# Patient Record
Sex: Female | Born: 1940 | Race: White | Hispanic: No | Marital: Single | Smoking: Current every day smoker
Health system: Southern US, Community
[De-identification: ages and names within clinical notes are randomized; demographics above are authoritative.]

## PROBLEM LIST (undated history)

## (undated) DIAGNOSIS — E785 Hyperlipidemia, unspecified: Secondary | ICD-10-CM

## (undated) DIAGNOSIS — F0390 Unspecified dementia without behavioral disturbance: Secondary | ICD-10-CM

## (undated) DIAGNOSIS — I2510 Atherosclerotic heart disease of native coronary artery without angina pectoris: Secondary | ICD-10-CM

## (undated) DIAGNOSIS — I10 Essential (primary) hypertension: Secondary | ICD-10-CM

## (undated) DIAGNOSIS — S73004A Unspecified dislocation of right hip, initial encounter: Secondary | ICD-10-CM

## (undated) HISTORY — PX: CARDIAC SURGERY: SHX584

## (undated) HISTORY — PX: CORONARY ANGIOPLASTY WITH STENT PLACEMENT: SHX49

## (undated) HISTORY — PX: TOTAL HIP ARTHROPLASTY: SHX124

---

## 2006-05-13 ENCOUNTER — Inpatient Hospital Stay: Payer: Self-pay | Admitting: Internal Medicine

## 2006-05-13 ENCOUNTER — Other Ambulatory Visit: Payer: Self-pay

## 2007-11-02 ENCOUNTER — Inpatient Hospital Stay: Payer: Self-pay | Admitting: Otolaryngology

## 2013-12-21 DIAGNOSIS — F17200 Nicotine dependence, unspecified, uncomplicated: Secondary | ICD-10-CM | POA: Diagnosis not present

## 2013-12-21 DIAGNOSIS — Z7982 Long term (current) use of aspirin: Secondary | ICD-10-CM | POA: Diagnosis not present

## 2013-12-21 DIAGNOSIS — Z9861 Coronary angioplasty status: Secondary | ICD-10-CM | POA: Diagnosis not present

## 2013-12-21 DIAGNOSIS — R0989 Other specified symptoms and signs involving the circulatory and respiratory systems: Secondary | ICD-10-CM | POA: Diagnosis not present

## 2013-12-21 DIAGNOSIS — E785 Hyperlipidemia, unspecified: Secondary | ICD-10-CM | POA: Diagnosis not present

## 2013-12-21 DIAGNOSIS — Z79899 Other long term (current) drug therapy: Secondary | ICD-10-CM | POA: Diagnosis not present

## 2013-12-21 DIAGNOSIS — I2510 Atherosclerotic heart disease of native coronary artery without angina pectoris: Secondary | ICD-10-CM | POA: Diagnosis not present

## 2013-12-21 DIAGNOSIS — I10 Essential (primary) hypertension: Secondary | ICD-10-CM | POA: Diagnosis not present

## 2014-07-29 ENCOUNTER — Emergency Department: Payer: Self-pay | Admitting: Internal Medicine

## 2014-07-29 DIAGNOSIS — G3109 Other frontotemporal dementia: Secondary | ICD-10-CM | POA: Diagnosis not present

## 2014-07-29 DIAGNOSIS — F600 Paranoid personality disorder: Secondary | ICD-10-CM | POA: Diagnosis not present

## 2014-07-29 DIAGNOSIS — N390 Urinary tract infection, site not specified: Secondary | ICD-10-CM | POA: Diagnosis not present

## 2014-07-29 DIAGNOSIS — R4182 Altered mental status, unspecified: Secondary | ICD-10-CM | POA: Diagnosis not present

## 2014-07-29 DIAGNOSIS — R45850 Homicidal ideations: Secondary | ICD-10-CM | POA: Diagnosis not present

## 2014-07-29 DIAGNOSIS — F329 Major depressive disorder, single episode, unspecified: Secondary | ICD-10-CM | POA: Diagnosis not present

## 2014-07-29 DIAGNOSIS — Z0389 Encounter for observation for other suspected diseases and conditions ruled out: Secondary | ICD-10-CM | POA: Diagnosis not present

## 2014-07-29 DIAGNOSIS — Z880 Allergy status to penicillin: Secondary | ICD-10-CM | POA: Diagnosis not present

## 2014-07-29 DIAGNOSIS — F22 Delusional disorders: Secondary | ICD-10-CM | POA: Diagnosis not present

## 2014-07-29 DIAGNOSIS — F419 Anxiety disorder, unspecified: Secondary | ICD-10-CM | POA: Diagnosis not present

## 2014-07-29 DIAGNOSIS — F29 Unspecified psychosis not due to a substance or known physiological condition: Secondary | ICD-10-CM | POA: Diagnosis not present

## 2014-07-29 LAB — COMPREHENSIVE METABOLIC PANEL
ALBUMIN: 4 g/dL (ref 3.4–5.0)
ALK PHOS: 114 U/L
Anion Gap: 8 (ref 7–16)
BILIRUBIN TOTAL: 0.7 mg/dL (ref 0.2–1.0)
BUN: 12 mg/dL (ref 7–18)
CALCIUM: 9.1 mg/dL (ref 8.5–10.1)
Chloride: 105 mmol/L (ref 98–107)
Co2: 27 mmol/L (ref 21–32)
Creatinine: 1.44 mg/dL — ABNORMAL HIGH (ref 0.60–1.30)
EGFR (African American): 46 — ABNORMAL LOW
EGFR (Non-African Amer.): 38 — ABNORMAL LOW
GLUCOSE: 109 mg/dL — AB (ref 65–99)
OSMOLALITY: 280 (ref 275–301)
Potassium: 3.9 mmol/L (ref 3.5–5.1)
SGOT(AST): 36 U/L (ref 15–37)
SGPT (ALT): 16 U/L
SODIUM: 140 mmol/L (ref 136–145)
Total Protein: 8.2 g/dL (ref 6.4–8.2)

## 2014-07-29 LAB — CBC
HCT: 45.2 % (ref 35.0–47.0)
HGB: 14.7 g/dL (ref 12.0–16.0)
MCH: 32.9 pg (ref 26.0–34.0)
MCHC: 32.4 g/dL (ref 32.0–36.0)
MCV: 102 fL — ABNORMAL HIGH (ref 80–100)
PLATELETS: 357 10*3/uL (ref 150–440)
RBC: 4.45 10*6/uL (ref 3.80–5.20)
RDW: 12.9 % (ref 11.5–14.5)
WBC: 7.6 10*3/uL (ref 3.6–11.0)

## 2014-07-29 LAB — URINALYSIS, COMPLETE
GLUCOSE, UR: NEGATIVE mg/dL (ref 0–75)
Ketone: NEGATIVE
Nitrite: POSITIVE
Ph: 5 (ref 4.5–8.0)
Specific Gravity: 1.018 (ref 1.003–1.030)
Squamous Epithelial: 11

## 2014-07-29 LAB — DRUG SCREEN, URINE

## 2014-07-29 LAB — ACETAMINOPHEN LEVEL: Acetaminophen: <2 ug/mL

## 2014-07-29 LAB — SALICYLATE LEVEL: SALICYLATES, SERUM: 3.1 mg/dL — AB

## 2014-07-29 LAB — ETHANOL

## 2014-07-31 DIAGNOSIS — N390 Urinary tract infection, site not specified: Secondary | ICD-10-CM | POA: Diagnosis not present

## 2014-07-31 DIAGNOSIS — I10 Essential (primary) hypertension: Secondary | ICD-10-CM | POA: Diagnosis not present

## 2014-07-31 DIAGNOSIS — Z7951 Long term (current) use of inhaled steroids: Secondary | ICD-10-CM | POA: Diagnosis not present

## 2014-07-31 DIAGNOSIS — F17210 Nicotine dependence, cigarettes, uncomplicated: Secondary | ICD-10-CM | POA: Diagnosis present

## 2014-07-31 DIAGNOSIS — I2510 Atherosclerotic heart disease of native coronary artery without angina pectoris: Secondary | ICD-10-CM | POA: Diagnosis present

## 2014-07-31 DIAGNOSIS — F29 Unspecified psychosis not due to a substance or known physiological condition: Secondary | ICD-10-CM | POA: Diagnosis not present

## 2014-07-31 DIAGNOSIS — Z882 Allergy status to sulfonamides status: Secondary | ICD-10-CM | POA: Diagnosis not present

## 2014-07-31 DIAGNOSIS — H02102 Unspecified ectropion of right lower eyelid: Secondary | ICD-10-CM | POA: Diagnosis not present

## 2014-07-31 DIAGNOSIS — Z880 Allergy status to penicillin: Secondary | ICD-10-CM | POA: Diagnosis not present

## 2014-07-31 DIAGNOSIS — Z7902 Long term (current) use of antithrombotics/antiplatelets: Secondary | ICD-10-CM | POA: Diagnosis not present

## 2014-07-31 DIAGNOSIS — F329 Major depressive disorder, single episode, unspecified: Secondary | ICD-10-CM | POA: Diagnosis not present

## 2014-07-31 DIAGNOSIS — N3001 Acute cystitis with hematuria: Secondary | ICD-10-CM | POA: Diagnosis present

## 2014-07-31 DIAGNOSIS — I252 Old myocardial infarction: Secondary | ICD-10-CM | POA: Diagnosis not present

## 2014-07-31 DIAGNOSIS — R4182 Altered mental status, unspecified: Secondary | ICD-10-CM | POA: Diagnosis not present

## 2014-07-31 DIAGNOSIS — F419 Anxiety disorder, unspecified: Secondary | ICD-10-CM | POA: Diagnosis not present

## 2014-07-31 DIAGNOSIS — F22 Delusional disorders: Secondary | ICD-10-CM | POA: Diagnosis not present

## 2014-07-31 DIAGNOSIS — E780 Pure hypercholesterolemia: Secondary | ICD-10-CM | POA: Diagnosis present

## 2014-07-31 DIAGNOSIS — Z7982 Long term (current) use of aspirin: Secondary | ICD-10-CM | POA: Diagnosis not present

## 2014-08-03 LAB — URINE CULTURE

## 2014-11-02 ENCOUNTER — Observation Stay
Admit: 2014-11-02 | Disposition: A | Discharge status: Home / Self Care | Payer: Self-pay | Attending: Orthopedic Surgery | Admitting: Orthopedic Surgery

## 2014-11-02 DIAGNOSIS — Z882 Allergy status to sulfonamides status: Secondary | ICD-10-CM | POA: Diagnosis not present

## 2014-11-02 DIAGNOSIS — Z9049 Acquired absence of other specified parts of digestive tract: Secondary | ICD-10-CM | POA: Diagnosis not present

## 2014-11-02 DIAGNOSIS — T84028D Dislocation of other internal joint prosthesis, subsequent encounter: Secondary | ICD-10-CM | POA: Diagnosis not present

## 2014-11-02 DIAGNOSIS — Z96649 Presence of unspecified artificial hip joint: Secondary | ICD-10-CM | POA: Diagnosis not present

## 2014-11-02 DIAGNOSIS — M25551 Pain in right hip: Secondary | ICD-10-CM | POA: Diagnosis not present

## 2014-11-02 DIAGNOSIS — R262 Difficulty in walking, not elsewhere classified: Secondary | ICD-10-CM | POA: Diagnosis not present

## 2014-11-02 DIAGNOSIS — Z7902 Long term (current) use of antithrombotics/antiplatelets: Secondary | ICD-10-CM | POA: Diagnosis not present

## 2014-11-02 DIAGNOSIS — Z87891 Personal history of nicotine dependence: Secondary | ICD-10-CM | POA: Diagnosis not present

## 2014-11-02 DIAGNOSIS — Z951 Presence of aortocoronary bypass graft: Secondary | ICD-10-CM | POA: Diagnosis not present

## 2014-11-02 DIAGNOSIS — T84020A Dislocation of internal right hip prosthesis, initial encounter: Secondary | ICD-10-CM | POA: Diagnosis not present

## 2014-11-02 DIAGNOSIS — T84028A Dislocation of other internal joint prosthesis, initial encounter: Secondary | ICD-10-CM | POA: Diagnosis not present

## 2014-11-02 DIAGNOSIS — Z7982 Long term (current) use of aspirin: Secondary | ICD-10-CM | POA: Diagnosis not present

## 2014-11-02 DIAGNOSIS — Z79899 Other long term (current) drug therapy: Secondary | ICD-10-CM | POA: Diagnosis not present

## 2014-11-02 DIAGNOSIS — I10 Essential (primary) hypertension: Secondary | ICD-10-CM | POA: Diagnosis not present

## 2014-11-02 DIAGNOSIS — S73004A Unspecified dislocation of right hip, initial encounter: Secondary | ICD-10-CM | POA: Diagnosis not present

## 2014-11-02 DIAGNOSIS — Z8673 Personal history of transient ischemic attack (TIA), and cerebral infarction without residual deficits: Secondary | ICD-10-CM | POA: Diagnosis not present

## 2014-11-02 DIAGNOSIS — Z96641 Presence of right artificial hip joint: Secondary | ICD-10-CM | POA: Diagnosis not present

## 2014-11-02 DIAGNOSIS — Z01818 Encounter for other preprocedural examination: Secondary | ICD-10-CM | POA: Diagnosis not present

## 2014-11-02 DIAGNOSIS — Z880 Allergy status to penicillin: Secondary | ICD-10-CM | POA: Diagnosis not present

## 2014-11-02 LAB — BASIC METABOLIC PANEL
Anion Gap: 11 (ref 7–16)
BUN: 40 mg/dL — AB
CO2: 24 mmol/L
CREATININE: 1.25 mg/dL — AB
Calcium, Total: 9.2 mg/dL
Chloride: 109 mmol/L
EGFR (Non-African Amer.): 42 — ABNORMAL LOW
GFR CALC AF AMER: 49 — AB
Glucose: 110 mg/dL — ABNORMAL HIGH
Potassium: 3.7 mmol/L
Sodium: 144 mmol/L

## 2014-11-02 LAB — APTT

## 2014-11-02 LAB — PROTIME-INR
INR: 1.1
Prothrombin Time: 14.4 secs

## 2014-11-02 LAB — CBC
HCT: 39.5 % (ref 35.0–47.0)
HGB: 13 g/dL (ref 12.0–16.0)
MCH: 32.5 pg (ref 26.0–34.0)
MCHC: 33 g/dL (ref 32.0–36.0)
MCV: 98 fL (ref 80–100)
Platelet: 171 10*3/uL (ref 150–440)
RBC: 4.01 10*6/uL (ref 3.80–5.20)
RDW: 13.5 % (ref 11.5–14.5)
WBC: 11.6 10*3/uL — AB (ref 3.6–11.0)

## 2014-11-02 LAB — CK: CK, TOTAL: 4746 U/L — AB

## 2014-11-05 DIAGNOSIS — S73004D Unspecified dislocation of right hip, subsequent encounter: Secondary | ICD-10-CM | POA: Diagnosis not present

## 2014-11-05 DIAGNOSIS — Z96641 Presence of right artificial hip joint: Secondary | ICD-10-CM | POA: Diagnosis not present

## 2014-11-05 DIAGNOSIS — Z87891 Personal history of nicotine dependence: Secondary | ICD-10-CM | POA: Diagnosis not present

## 2014-11-05 DIAGNOSIS — Z7982 Long term (current) use of aspirin: Secondary | ICD-10-CM | POA: Diagnosis not present

## 2014-11-05 NOTE — Consult Note (Signed)
PATIENT NAME:  Tiffany Turner, Tiffany Turner MR#:  [id number] DATE OF BIRTH:  01/06/1941  DATE OF CONSULTATION:  07/31/2014  REFERRING PHYSICIAN:   CONSULTING PHYSICIAN:  Audery Amel, MD  IDENTIFYING INFORMATION AND REASON FOR CONSULT: This is a 74 year old woman who was brought to the Emergency Room the day before yesterday by her family because of bizarre behavior.   CHIEF COMPLAINT: "You already know."   HISTORY OF PRESENT ILLNESS: Information obtained from the patient and the chart. The patient was seen by Dr. Guss Bunde over the weekend, who recommended re-evaluation but was suspicious that she was having mental health issues. On reevaluation today the patient is not a very forthcoming historian. She by and large refuses to cooperate for most of the history.  She does know that she is in an Emergency Room. She knows that she is at [facility] in [place]. Knows the year, but thinks the month is February. She cannot describe for me the reasons why she was brought up here, saying that she just needed "a checkup." When I reviewed with her the information in the chart such as concerns about her walking outside in the snow and threatening statements, she got angry and dismissed all of this and changed the topic. The patient will not tell me about her mood. Will not tell me about sleep patterns. She got very quickly paranoid at any questions. She is not forthcoming in answers about hallucinations or suicidal or homicidal ideation. She does not seem to have improved or changed since being in the hospital the last couple of days or receiving initial treatment for her urinary tract infection.   PAST PSYCHIATRIC HISTORY: According to the chart she has no previous psychiatric history. Son reports that this behavior is very unlike her. No history of psychiatric hospitalization or psychiatric medicine treatment or suicidal behavior in the past.   SUBSTANCE ABUSE HISTORY: Nothing is known about this.   FAMILY  HISTORY: The patient will not answer any questions about it.   SOCIAL HISTORY: It is not clear to me whether she lives by herself or whether her son is living with her right now and she will not answer the question.   PAST MEDICAL HISTORY: She tells me that she has a heart doctor, but does not have any other regular doctors. She refuses to answer any other medical questions at this time.   MEDICATIONS:  The patient is listed as being on medication for blood pressure and having been on clindamycin, but that may not be the most up to date list. She has a history according to the old chart of coronary artery disease and hyperlipidemia.   REVIEW OF SYSTEMS: The patient un-forthcoming about most of this. Will not answer any questions about current symptoms.   MENTAL STATUS EXAMINATION: A somewhat disheveled woman who does not look very healthy and looks older than her stated age. She made intermittent eye contact. Psychomotor activity was fairly normal. Speech was clipped at times. Became angry at times. Eventually refused to speak at all. Affect was labile with rapid escalation of irritability. Mood was stated as being none of my business. Thoughts were disorganized and paranoid. Did not report hallucinations. Would not answer questions about suicidality. She could repeat 3 words immediately, remembered 2 out of 3 at 3 minutes. Alert and oriented to being in the hospital, but not clear that she understands why.   LABORATORY RESULTS: The patient has a urinalysis very consistent with a urinary tract infection which has grown  out gram-negative rods, full sensitivities and identification pending. EKG, normal sinus rhythm. Drug screen is all negative. CBC unremarkable, normal white count, Chemistry panel, elevated creatinine just to 1.4.   VITAL SIGNS: Blood pressure 160/81, respirations 18, pulse 102, temperature 98.3.   ASSESSMENT: This is a 74 year old woman with acute mental status changes, who presents  with paranoia and disorganized thinking, recent bizarre behavior. She does have a urinary tract infection, it is not clear how much this is a contributor to her current symptoms. After 2 days in the Emergency Room she is seeming no better mentally. She has initiated treatment for the urinary tract infection. At this point I think because of her uncooperativeness and the recent reports of erratic behavior she continues to meet commitment criteria and needs further evaluation and treatment.   TREATMENT PLAN: Suggest we proceed with referral to [facility] or other geriatric units. Case discussed with Emergency Room doctor and social worker and nurse on the psychiatry team. Continue treatment for her urinary tract infection and other medications in the meanwhile.   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   SECONDARY DIAGNOSES:   AXIS III:    1.  History of high blood pressure.  2.  Coronary artery disease.     ____________________________ Audery AmelJohn T. Twala Collings, MD jtc:bu D: 07/31/2014 12:55:56 ET T: 07/31/2014 13:22:50 ET JOB#: [id number]  cc: Audery AmelJohn T. Brinley Treanor, MD, <Dictator> Audery AmelJOHN T Derricka Mertz MD ELECTRONICALLY SIGNED 08/16/2014 17:20

## 2014-11-05 NOTE — Consult Note (Signed)
PATIENT NAME:  Tiffany Turner, Tiffany Turner MR#:  [id number] DATE OF BIRTH:  25-Mar-1941  DATE OF CONSULTATION:  07/29/2014  REFERRING PHYSICIAN:   CONSULTING PHYSICIAN:  Shannara Winbush K. Yarely Bebee, MD  SUBJECTIVE: The patient was seen in consultation in the Emergency Room. The patient is a 74 year old white female who was never married, single, but put several children up for adoption and raised 1 son who is 75 years old and is in touch with him. The patient retired after having worked at [employer] for many years. According to information obtained from the staff, patient has been walking in the snow without shoes and wandering around with a suitcase and does not know where she was going. This was a big concern, and so son got involved and brought her here for help.  PAST PSYCHIATRIC HISTORY: No previous history of inpatient hold on psychiatry.  No history of suicidal ideation. Not being followed by any psychiatrist. Patient is the historian here other than the information obtained from the staff physician.  MENTAL STATUS:  Patient is adequately dressed. Grooming is fair. Alert and oriented only with help. However, she stated this was 08/29/2014, even after asking her twice. She knew she is at [facility] [place]. [place] [address] [place]. Denies feeling depressed.  Denies feeling hopeless or helpless.  Denies s/h ideas or plans. Admits that she is feeling lonely since she lost 2 of her pet dogs. No psychosis. Denies auditory or visual hallucinations. No paranoid thinking. She could spell the word "world" forward, could not spell it backward. Regarding judgment, for fire she said she would look for a fire alarm. Insight and judgment fair but impulse control seems to be poor.   IMPRESSION: Rule out early dementia with behavioral problems.  RECOMMENDATIONS: Recommend inpatient hospitalization to a geriatric facility for a closer observation  and evaluation and help as needed.   ____________________________ Jannet MantisSurya K. Guss Bundehalla, MD skc:ST D: 07/29/2014 17:31:27 ET T: 07/29/2014 21:45:58 ET JOB#: [id number]  cc: Monika SalkSurya K. Guss Bundehalla, MD, <Dictator> Beau FannySURYA K Sadrac Zeoli MD ELECTRONICALLY SIGNED 07/30/2014 12:30

## 2014-11-05 NOTE — Op Note (Signed)
PATIENT NAME:  Tiffany Turner, Tiffany Turner MR#:  [id number] DATE OF BIRTH:  1941/02/24  DATE OF PROCEDURE:  11/02/2014  PREOPERATIVE DIAGNOSIS: Dislocation right total hip.   POSTOPERATIVE DIAGNOSIS: Dislocation right total hip.  PROCEDURE: Closed reduction right total hip.   ANESTHESIA: General.   SURGEON: Kennedy BuckerMichael Heidi Lemay, MD  DESCRIPTION OF PROCEDURE: The patient was brought to the Operating Room and after adequate anesthesia was obtained, the appropriate patient identification and timeout procedure was completed. With fluoroscopy, longitudinal traction was applied and with gentle initially internal rotation and then external rotation as the leg was brought out into extension, the hip reduced. It appeared concentric and a permanent C-arm view was obtained, showing reduction. A short leg knee immobilizer was then applied and the patient was sent to the recovery room in stable condition. There was no blood loss. No complications. No specimen. ____________________________ Leitha SchullerMichael J. Kristoff Coonradt, MD mjm:AT D: 11/02/2014 20:37:26 ET T: 11/03/2014 09:19:27 ET JOB#: [id number]  cc: Leitha SchullerMichael J. Kenadie Royce, MD, <Dictator> Leitha SchullerMICHAEL J Cesareo Vickrey MD ELECTRONICALLY SIGNED 11/03/2014 9:47

## 2014-11-05 NOTE — H&P (Signed)
PATIENT NAME:  Tiffany Turner, Tiffany Turner MR#:  [id number] DATE OF BIRTH:  [birth year]  DATE OF ADMISSION:  11/02/2014  CHIEF COMPLAINT: Right hip pain.   HISTORY OF PRESENT ILLNESS: The patient is a confused, 74 year old who comes into the emergency room with a history of kicking at a drawer yesterday, by her report, suffering a popping to her hip. She stayed home until today to have this addressed. She has had a recent admission for confusion with unknown cause. In the past, apparently she was doing better. She has a remote history of hip replacement more than 10 years ago. She said it was Dr. Hyacinth MeekerMiller. It is unknown if this was done here or in [place].   PAST MEDICAL HISTORY: Remarkable for a prior cholecystectomy, C-spine surgery, her right total hip. History is obtained from the medical records. The patient is unable to give this. She also has had an episode of an admission for a neck infection, oral infection. 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    ALLERGIES: SULFA AND PENICILLIN.  SOCIAL HISTORY:  Prior history of smoking 1 to 2 packs per day.   CURRENT MEDICATIONS: Again obtained from records. The patient is unable to give her medication list: Toprol-XL 50 mg at night, Plavix 75 mg daily at bedtime, enalapril 20 mg at bedtime, Crestor 40 mg at bedtime, aspirin 81 mg daily.   REVIEW OF SYSTEMS: Is generally negative. The patient does not have a specific complaint and is confused when asked questions regarding review of systems.   PHYSICAL EXAMINATION: GENERAL: A white female who appears her stated age, in mild distress.  HEENT: Remarkable for full upper and lower dentures.  LUNGS: Clear.  HEART: Regular rate and rhythm.  ABDOMEN: Soft, nontender.  EXTREMITIES: Remarkable for a shortened and internally rotated right lower extremity. She is able to flex and extend the toes. The skin is intact and pulses are intact.   DIAGNOSTIC DATA: X-rays show a  posteriorly dislocated right total hip with long stem and Tri-spike cup.   IMPRESSION: Dislocated total hip.   RECOMMENDATIONS: For closed reduction. I will talk to her son prior to the procedure. If open reduction is needed, we will need to get records if possible and have polyethylene cup revision, if that is needed.    ____________________________ Leitha SchullerMichael J. Kamorah Nevils, MD mjm:TT D: 11/02/2014 16:28:30 ET T: 11/02/2014 16:55:39 ET JOB#: [id number]  cc: Leitha SchullerMichael J. Errol Ala, MD, <Dictator> Leitha SchullerMICHAEL J Cristal Howatt MD ELECTRONICALLY SIGNED 11/03/2014 6:50

## 2014-11-07 DIAGNOSIS — Z96641 Presence of right artificial hip joint: Secondary | ICD-10-CM | POA: Diagnosis not present

## 2014-11-07 DIAGNOSIS — Z87891 Personal history of nicotine dependence: Secondary | ICD-10-CM | POA: Diagnosis not present

## 2014-11-07 DIAGNOSIS — Z7982 Long term (current) use of aspirin: Secondary | ICD-10-CM | POA: Diagnosis not present

## 2014-11-07 DIAGNOSIS — S73004D Unspecified dislocation of right hip, subsequent encounter: Secondary | ICD-10-CM | POA: Diagnosis not present

## 2014-11-09 DIAGNOSIS — Z7982 Long term (current) use of aspirin: Secondary | ICD-10-CM | POA: Diagnosis not present

## 2014-11-09 DIAGNOSIS — Z87891 Personal history of nicotine dependence: Secondary | ICD-10-CM | POA: Diagnosis not present

## 2014-11-09 DIAGNOSIS — S73004D Unspecified dislocation of right hip, subsequent encounter: Secondary | ICD-10-CM | POA: Diagnosis not present

## 2014-11-09 DIAGNOSIS — Z96641 Presence of right artificial hip joint: Secondary | ICD-10-CM | POA: Diagnosis not present

## 2014-11-13 DIAGNOSIS — Z96641 Presence of right artificial hip joint: Secondary | ICD-10-CM | POA: Diagnosis not present

## 2014-11-13 DIAGNOSIS — Z87891 Personal history of nicotine dependence: Secondary | ICD-10-CM | POA: Diagnosis not present

## 2014-11-13 DIAGNOSIS — S73004D Unspecified dislocation of right hip, subsequent encounter: Secondary | ICD-10-CM | POA: Diagnosis not present

## 2014-11-13 DIAGNOSIS — Z7982 Long term (current) use of aspirin: Secondary | ICD-10-CM | POA: Diagnosis not present

## 2014-11-16 DIAGNOSIS — Z96641 Presence of right artificial hip joint: Secondary | ICD-10-CM | POA: Diagnosis not present

## 2014-11-16 DIAGNOSIS — Z7982 Long term (current) use of aspirin: Secondary | ICD-10-CM | POA: Diagnosis not present

## 2014-11-16 DIAGNOSIS — Z87891 Personal history of nicotine dependence: Secondary | ICD-10-CM | POA: Diagnosis not present

## 2014-11-16 DIAGNOSIS — S73004D Unspecified dislocation of right hip, subsequent encounter: Secondary | ICD-10-CM | POA: Diagnosis not present

## 2015-02-16 ENCOUNTER — Encounter: Payer: Self-pay | Admitting: Occupational Medicine

## 2015-02-16 ENCOUNTER — Emergency Department
Admission: EM | Admit: 2015-02-16 | Discharge: 2015-02-16 | Disposition: A | Discharge status: Home / Self Care | Payer: Medicare Other | Attending: Emergency Medicine | Admitting: Emergency Medicine

## 2015-02-16 DIAGNOSIS — R451 Restlessness and agitation: Secondary | ICD-10-CM | POA: Diagnosis not present

## 2015-02-16 DIAGNOSIS — Z720 Tobacco use: Secondary | ICD-10-CM | POA: Diagnosis not present

## 2015-02-16 DIAGNOSIS — G308 Other Alzheimer's disease: Secondary | ICD-10-CM

## 2015-02-16 DIAGNOSIS — Z7902 Long term (current) use of antithrombotics/antiplatelets: Secondary | ICD-10-CM | POA: Insufficient documentation

## 2015-02-16 DIAGNOSIS — I10 Essential (primary) hypertension: Secondary | ICD-10-CM | POA: Diagnosis not present

## 2015-02-16 DIAGNOSIS — Z79899 Other long term (current) drug therapy: Secondary | ICD-10-CM | POA: Diagnosis not present

## 2015-02-16 DIAGNOSIS — Z880 Allergy status to penicillin: Secondary | ICD-10-CM | POA: Diagnosis not present

## 2015-02-16 DIAGNOSIS — N390 Urinary tract infection, site not specified: Secondary | ICD-10-CM | POA: Diagnosis not present

## 2015-02-16 DIAGNOSIS — Z7982 Long term (current) use of aspirin: Secondary | ICD-10-CM | POA: Insufficient documentation

## 2015-02-16 DIAGNOSIS — Z046 Encounter for general psychiatric examination, requested by authority: Secondary | ICD-10-CM | POA: Diagnosis present

## 2015-02-16 DIAGNOSIS — F02818 Dementia in other diseases classified elsewhere, unspecified severity, with other behavioral disturbance: Secondary | ICD-10-CM

## 2015-02-16 DIAGNOSIS — G309 Alzheimer's disease, unspecified: Secondary | ICD-10-CM

## 2015-02-16 DIAGNOSIS — F0281 Dementia in other diseases classified elsewhere with behavioral disturbance: Secondary | ICD-10-CM

## 2015-02-16 HISTORY — DX: Unspecified dislocation of right hip, initial encounter: S73.004A

## 2015-02-16 HISTORY — DX: Essential (primary) hypertension: I10

## 2015-02-16 HISTORY — DX: Atherosclerotic heart disease of native coronary artery without angina pectoris: I25.10

## 2015-02-16 HISTORY — DX: Hyperlipidemia, unspecified: E78.5

## 2015-02-16 LAB — URINALYSIS COMPLETE WITH MICROSCOPIC (ARMC ONLY)
BILIRUBIN URINE: NEGATIVE
Glucose, UA: 150 mg/dL — AB
Ketones, ur: NEGATIVE mg/dL
Leukocytes, UA: NEGATIVE
Nitrite: NEGATIVE
PROTEIN: NEGATIVE mg/dL
SPECIFIC GRAVITY, URINE: 1.014 (ref 1.005–1.030)
pH: 5 (ref 5.0–8.0)

## 2015-02-16 LAB — COMPREHENSIVE METABOLIC PANEL WITH GFR
ALT: 11 U/L — ABNORMAL LOW (ref 14–54)
AST: 26 U/L (ref 15–41)
Albumin: 4.3 g/dL (ref 3.5–5.0)
Alkaline Phosphatase: 87 U/L (ref 38–126)
Anion gap: 10 (ref 5–15)
BUN: 22 mg/dL — ABNORMAL HIGH (ref 6–20)
CO2: 23 mmol/L (ref 22–32)
Calcium: 9.3 mg/dL (ref 8.9–10.3)
Chloride: 107 mmol/L (ref 101–111)
Creatinine, Ser: 1.4 mg/dL — ABNORMAL HIGH (ref 0.44–1.00)
GFR calc Af Amer: 41 mL/min — ABNORMAL LOW (ref >60)
GFR calc non Af Amer: 36 mL/min — ABNORMAL LOW (ref >60)
Glucose, Bld: 119 mg/dL — ABNORMAL HIGH (ref 65–99)
Potassium: 3.5 mmol/L (ref 3.5–5.1)
Sodium: 140 mmol/L (ref 135–145)
Total Bilirubin: 0.7 mg/dL (ref 0.3–1.2)
Total Protein: 7.9 g/dL (ref 6.5–8.1)

## 2015-02-16 LAB — CBC WITH DIFFERENTIAL/PLATELET
BASOS ABS: 0.3 10*3/uL — AB (ref 0–0.1)
BASOS PCT: 4 %
EOS PCT: 3 %
Eosinophils Absolute: 0.2 10*3/uL (ref 0–0.7)
HEMATOCRIT: 40.5 % (ref 35.0–47.0)
Hemoglobin: 13.7 g/dL (ref 12.0–16.0)
Lymphocytes Relative: 16 %
Lymphs Abs: 1.2 10*3/uL (ref 1.0–3.6)
MCH: 33.8 pg (ref 26.0–34.0)
MCHC: 33.8 g/dL (ref 32.0–36.0)
MCV: 100 fL (ref 80.0–100.0)
MONO ABS: 0.4 10*3/uL (ref 0.2–0.9)
Monocytes Relative: 5 %
NEUTROS ABS: 5.5 10*3/uL (ref 1.4–6.5)
Neutrophils Relative %: 72 %
Platelets: 354 10*3/uL (ref 150–440)
RBC: 4.05 MIL/uL (ref 3.80–5.20)
RDW: 13.8 % (ref 11.5–14.5)
WBC: 7.6 10*3/uL (ref 3.6–11.0)

## 2015-02-16 LAB — ETHANOL: Alcohol, Ethyl (B): <5 mg/dL (ref <5)

## 2015-02-16 MED ORDER — CIPROFLOXACIN HCL 250 MG PO TABS: 250.0000 mg | ORAL_TABLET | Freq: Two times a day (BID) | ORAL | Status: AC

## 2015-02-16 MED ORDER — RISPERIDONE 0.5 MG PO TABS: 0.5000 mg | ORAL_TABLET | Freq: Every day | ORAL | Status: DC

## 2015-02-16 NOTE — ED Notes (Signed)
BEHAVIORAL HEALTH ROUNDING Patient sleeping: No. Patient alert and oriented: no Behavior appropriate: Yes.  ; If no, describe:  Nutrition and fluids offered: Yes  Toileting and hygiene offered: Yes  Sitter present: no Law enforcement present: Yes  
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
## 2015-02-16 NOTE — ED Notes (Signed)

## 2015-02-16 NOTE — ED Notes (Signed)
BEHAVIORAL HEALTH ROUNDING Patient sleeping: Yes.   Patient alert and oriented: no Behavior appropriate: Yes.  ; If no, describe:  Nutrition and fluids offered: Yes  Toileting and hygiene offered: Yes  Sitter present: no Law enforcement present: Yes  

## 2015-02-16 NOTE — Discharge Instructions (Signed)
Please follow-up with [facility] on Monday. It is also very important to follow-up with a primary care doctor of your choosing to make sure that she urinary tract has improved. It is also possible that you could be in the very early stages of dementia but this needs to be further evaluated by a primary care doctor. Return to the ER for new or worsening symptoms, confusion, fever, vomiting, abdominal pain, or any other concerns.   Urinary Tract Infection A urinary tract infection (UTI) can occur any place along the urinary tract. The tract includes the kidneys, ureters, bladder, and urethra. A type of germ called bacteria often causes a UTI. UTIs are often helped with antibiotic medicine.  HOME CARE   If given, take antibiotics as told by your doctor. Finish them even if you start to feel better.  Drink enough fluids to keep your pee (urine) clear or pale yellow.  Avoid tea, drinks with caffeine, and bubbly (carbonated) drinks.  Pee often. Avoid holding your pee in for a long time.  Pee before and after having sex (intercourse).  Wipe from front to back after you poop (bowel movement) if you are a woman. Use each tissue only once. GET HELP RIGHT AWAY IF:   You have back pain.  You have lower belly (abdominal) pain.  You have chills.  You feel sick to your stomach (nauseous).  You throw up (vomit).  Your burning or discomfort with peeing does not go away.  You have a fever.  Your symptoms are not better in 3 days. MAKE SURE YOU:   Understand these instructions.  Will watch your condition.  Will get help right away if you are not doing well or get worse. Document Released: 12/10/2007 Document Revised: 03/17/2012 Document Reviewed: 01/22/2012 [facility] Patient Information 2015 [place], [place]. This information is not intended to replace advice given to you by your health care provider. Make sure you discuss any questions you have with your health care provider.

## 2015-02-16 NOTE — ED Notes (Signed)
Psych md with pt 

## 2015-02-16 NOTE — ED Notes (Signed)
Patient assigned to appropriate care area. Patient oriented to unit/care area: Informed that, for their safety, care areas are designed for safety and monitored by security cameras at all times; and visiting hours explained to patient. Patient verbalizes understanding, and verbal contract for safety obtained. 
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
## 2015-02-16 NOTE — ED Notes (Signed)
BEHAVIORAL HEALTH ROUNDING Patient sleeping: No. Patient alert and oriented: yes know she is in the [facility] doesn't know why son sent her here Behavior appropriate: Yes.  ; If no, describe: pt is pleasant and cooperative Nutrition and fluids offered: Yes  Toileting and hygiene offered: Yes  Sitter present: yes Law enforcement present: Yes
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
## 2015-02-16 NOTE — [facility] Counselor (Signed)
Writer spoke to Pt. Son about discharging and transportation.  Pt. Son would not give any specific information for the contact person who would be picking up pt. He said he would call back when he had the information.

## 2015-02-16 NOTE — [facility] Counselor (Signed)
Per request of Psych MD (Dr. Toni Amend), writer provided the pt. with information and instructions on how to access Out Pt. Mental Health Treatment ([facility] and [facility])

## 2015-02-16 NOTE — ED Notes (Signed)
ED BHU PLACEMENT JUSTIFICATION Is the patient under IVC or is there intent for IVC: Yes.   Is the patient medically cleared: No. Is there vacancy in the ED BHU: Yes.   Is the population mix appropriate for patient: Yes.   Is the patient awaiting placement in inpatient or outpatient setting:unknown Has the patient had a psychiatric consult: Yes.   Survey of unit performed for contraband, proper placement and condition of furniture, tampering with fixtures in bathroom, shower, and each patient room: Yes.  ; Findings:  APPEARANCE/BEHAVIOR Calm cooperative NEURO ASSESSMENT  Orientation: time, place and person doent know why son sent her here Hallucinations: No.None noted (Hallucinations) Speech: Normal Gait: unsteady RESPIRATORY ASSESSMENT Normal expansion.  Clear to auscultation.  No rales, rhonchi, or wheezing. CARDIOVASCULAR ASSESSMENT regular rate and rhythm, S1, S2 normal, no murmur, click, rub or gallop GASTROINTESTINAL ASSESSMENT soft, nontender, BS WNL, no r/g EXTREMITIES normal strength, tone, and muscle mass PLAN OF CARE Provide calm/safe environment. Vital signs assessed twice daily. ED BHU Assessment once each 12-hour shift. Collaborate with intake RN daily or as condition indicates. Assure the ED provider has rounded once each shift. Provide and encourage hygiene. Provide redirection as needed. Assess for escalating behavior; address immediately and inform ED provider.  Assess family dynamic and appropriateness for visitation as needed: Yes.  ; If necessary, describe findings: son left phone number [phone] Educate the patient/family about BHU procedures/visitation: Yes.  ; If necessary, describe findings:

## 2015-02-16 NOTE — ED Provider Notes (Signed)
-----------------------------------------   4:31 PM on 02/16/2015 -----------------------------------------  Dr. Toni Amend saw and evaluated Tiffany Turner. He notes she was in here in January with more significant symptoms of paranoia and agitation for which she was transferred to [facility]. There she was started on Risperdal which she took for about 1 month. He suspects she has very mild dementia. She lives alone [place] with her son living nearby. He does not feel she meets inpatient criteria and feels that her symptoms may be improved with a small nighttime dose of Risperdal which she has prescribed. She is to follow-up with [facility].  Maurilio Lovely, MD 02/16/15 [id number]

## 2015-02-16 NOTE — ED Notes (Signed)
Pt is now atleast lending against the wall while sitting on the bed.
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
## 2015-02-16 NOTE — ED Notes (Signed)
BEHAVIORAL HEALTH ROUNDING Patient sleeping: No. Patient alert and oriented: yes except doesn't know why son sent her here Behavior appropriate: Yes.  ; If no, describe: pt sitting on the edge of bed will not lie down or put on sock pt has head lending over pt is a high fall risk monitor closely Nutrition and fluids offered: Yes  Toileting and hygiene offered: Yes  Sitter present: yes Law enforcement present: Yes

## 2015-02-16 NOTE — BH Assessment (Signed)
Pt. Son called back about discharging and transportation. He stated that Tiffany Turner ([phone]) and Tiffany Turner will be here to pick her pt. When she discharges.  Pt. Son had questions about his mother discharging and medications. Writer spoke to pt. Nurse,  Nicholos Johns, RN about calling Pt. Son back about his concerns.

## 2015-10-02 ENCOUNTER — Encounter: Payer: Self-pay | Admitting: Emergency Medicine

## 2015-10-02 ENCOUNTER — Emergency Department
Admission: EM | Admit: 2015-10-02 | Discharge: 2015-10-06 | Disposition: A | Discharge status: Other Acute Inpatient Hospital | Payer: Medicare Other | Attending: Emergency Medicine | Admitting: Emergency Medicine

## 2015-10-02 DIAGNOSIS — Z7982 Long term (current) use of aspirin: Secondary | ICD-10-CM | POA: Insufficient documentation

## 2015-10-02 DIAGNOSIS — N390 Urinary tract infection, site not specified: Secondary | ICD-10-CM | POA: Diagnosis not present

## 2015-10-02 DIAGNOSIS — Z880 Allergy status to penicillin: Secondary | ICD-10-CM | POA: Diagnosis not present

## 2015-10-02 DIAGNOSIS — F0390 Unspecified dementia without behavioral disturbance: Secondary | ICD-10-CM | POA: Diagnosis not present

## 2015-10-02 DIAGNOSIS — I10 Essential (primary) hypertension: Secondary | ICD-10-CM | POA: Insufficient documentation

## 2015-10-02 DIAGNOSIS — R410 Disorientation, unspecified: Secondary | ICD-10-CM | POA: Insufficient documentation

## 2015-10-02 DIAGNOSIS — F17210 Nicotine dependence, cigarettes, uncomplicated: Secondary | ICD-10-CM | POA: Insufficient documentation

## 2015-10-02 DIAGNOSIS — Z7902 Long term (current) use of antithrombotics/antiplatelets: Secondary | ICD-10-CM | POA: Insufficient documentation

## 2015-10-02 DIAGNOSIS — F0281 Dementia in other diseases classified elsewhere with behavioral disturbance: Secondary | ICD-10-CM | POA: Diagnosis not present

## 2015-10-02 DIAGNOSIS — Z79899 Other long term (current) drug therapy: Secondary | ICD-10-CM | POA: Insufficient documentation

## 2015-10-02 DIAGNOSIS — F0280 Dementia in other diseases classified elsewhere without behavioral disturbance: Secondary | ICD-10-CM | POA: Insufficient documentation

## 2015-10-02 DIAGNOSIS — G309 Alzheimer's disease, unspecified: Secondary | ICD-10-CM | POA: Diagnosis not present

## 2015-10-02 DIAGNOSIS — G300 Alzheimer's disease with early onset: Secondary | ICD-10-CM | POA: Diagnosis not present

## 2015-10-02 DIAGNOSIS — Z046 Encounter for general psychiatric examination, requested by authority: Secondary | ICD-10-CM | POA: Diagnosis present

## 2015-10-02 LAB — CBC WITH DIFFERENTIAL/PLATELET
Basophils Absolute: 0.1 10*3/uL (ref 0–0.1)
Basophils Relative: 1 %
EOS ABS: 0.1 10*3/uL (ref 0–0.7)
Eosinophils Relative: 1 %
HCT: 38.2 % (ref 35.0–47.0)
HEMOGLOBIN: 12.9 g/dL (ref 12.0–16.0)
Lymphocytes Relative: 31 %
Lymphs Abs: 3 10*3/uL (ref 1.0–3.6)
MCH: 33.1 pg (ref 26.0–34.0)
MCHC: 33.8 g/dL (ref 32.0–36.0)
MCV: 97.9 fL (ref 80.0–100.0)
Monocytes Absolute: 0.7 10*3/uL (ref 0.2–0.9)
Neutro Abs: 5.9 10*3/uL (ref 1.4–6.5)
Platelets: 315 10*3/uL (ref 150–440)
RBC: 3.9 MIL/uL (ref 3.80–5.20)
RDW: 13.2 % (ref 11.5–14.5)
WBC: 9.8 10*3/uL (ref 3.6–11.0)

## 2015-10-02 LAB — COMPREHENSIVE METABOLIC PANEL
ALK PHOS: 72 U/L (ref 38–126)
ALT: 10 U/L — AB (ref 14–54)
AST: 21 U/L (ref 15–41)
Albumin: 3.8 g/dL (ref 3.5–5.0)
Anion gap: 8 (ref 5–15)
BUN: 13 mg/dL (ref 6–20)
CALCIUM: 9.2 mg/dL (ref 8.9–10.3)
CO2: 24 mmol/L (ref 22–32)
Chloride: 107 mmol/L (ref 101–111)
Creatinine, Ser: 1.09 mg/dL — ABNORMAL HIGH (ref 0.44–1.00)
GFR calc Af Amer: 56 mL/min — ABNORMAL LOW (ref >60)
GFR calc non Af Amer: 48 mL/min — ABNORMAL LOW (ref >60)
GLUCOSE: 121 mg/dL — AB (ref 65–99)
Potassium: 2.9 mmol/L — CL (ref 3.5–5.1)
SODIUM: 139 mmol/L (ref 135–145)
Total Bilirubin: 0.6 mg/dL (ref 0.3–1.2)
Total Protein: 7.3 g/dL (ref 6.5–8.1)

## 2015-10-02 LAB — URINALYSIS COMPLETE WITH MICROSCOPIC (ARMC ONLY)
BILIRUBIN URINE: NEGATIVE
GLUCOSE, UA: NEGATIVE mg/dL
Ketones, ur: NEGATIVE mg/dL
Leukocytes, UA: NEGATIVE
NITRITE: POSITIVE — AB
Protein, ur: NEGATIVE mg/dL
Specific Gravity, Urine: 1.019 (ref 1.005–1.030)
pH: 5 (ref 5.0–8.0)

## 2015-10-02 LAB — URINE DRUG SCREEN, QUALITATIVE (ARMC ONLY)
Amphetamines, Ur Screen: NOT DETECTED
BARBITURATES, UR SCREEN: NOT DETECTED
Benzodiazepine, Ur Scrn: NOT DETECTED
CANNABINOID 50 NG, UR ~~LOC~~: NOT DETECTED
COCAINE METABOLITE, UR ~~LOC~~: NOT DETECTED
MDMA (Ecstasy)Ur Screen: NOT DETECTED
Methadone Scn, Ur: NOT DETECTED
Opiate, Ur Screen: NOT DETECTED
PHENCYCLIDINE (PCP) UR S: NOT DETECTED
TRICYCLIC, UR SCREEN: NOT DETECTED

## 2015-10-02 LAB — ETHANOL: Alcohol, Ethyl (B): <5 mg/dL (ref <5)

## 2015-10-02 MED ORDER — CIPROFLOXACIN HCL 500 MG PO TABS
500.0000 mg | ORAL_TABLET | Freq: Once | ORAL | Status: AC
  Administered 2015-10-03: 500 mg via ORAL
  Filled 2015-10-02: qty 1

## 2015-10-02 NOTE — ED Provider Notes (Signed)
[facility] Emergency Department Provider Note    ____________________________________________  Time seen: ~2230  I have reviewed the triage vital signs and the nursing notes.   HISTORY  Chief Complaint Psychiatric Evaluation; Paranoid; and Dementia   History limited by: Not Limited   HPI Tiffany Turner is a 75 y.o. female who presents to the emergency department under IVC paperwork because of concerns for increased confusion and self care. On my exam patient denies any concerns. She denies any confusion. She denies any thoughts of wanting to herself or others. She denies any recent fevers, nausea vomiting or diarrhea.    Past Medical History  Diagnosis Date  . Hypertension   . Hyperlipemia   . Coronary artery disease     stents placed  . Hip dislocation, right (HCC)     hx of dislocation    Patient Active Problem List   Diagnosis Date Noted  . Dementia, Alzheimer's, with behavior disturbance 02/16/2015  . Hypertension 02/16/2015    Past Surgical History  Procedure Laterality Date  . Cardiac surgery      stents    Current Outpatient Rx  Name  Route  Sig  Dispense  Refill  . aspirin 81 MG tablet   Oral   Take 81 mg by mouth at bedtime.         . bisacodyl (DULCOLAX) 10 MG suppository   Rectal   Place 10 mg rectally daily as needed for moderate constipation.         . clopidogrel (PLAVIX) 75 MG tablet   Oral   Take 75 mg by mouth at bedtime.         . docusate calcium (SURFAK) 240 MG capsule   Oral   Take 240 mg by mouth daily.         . enalapril (VASOTEC) 20 MG tablet   Oral   Take 20 mg by mouth at bedtime.         . metoprolol succinate (TOPROL-XL) 50 MG 24 hr tablet   Oral   Take 50 mg by mouth at bedtime. Take with or immediately following a meal.         . risperiDONE (RISPERDAL) 0.5 MG tablet   Oral   Take 1 tablet (0.5 mg total) by mouth at bedtime.   30 tablet   0   . rosuvastatin (CRESTOR)  40 MG tablet   Oral   Take 40 mg by mouth at bedtime.           Allergies Penicillins and Sulfa antibiotics  No family history on file.  Social History Social History  Substance Use Topics  . Smoking status: Current Every Day Smoker -- 0.50 packs/day    Types: Cigarettes  . Smokeless tobacco: Not on file  . Alcohol Use: No    Review of Systems  Constitutional: Negative for fever. Cardiovascular: Negative for chest pain. Respiratory: Negative for shortness of breath. Gastrointestinal: Negative for abdominal pain, vomiting and diarrhea. Neurological: Negative for headaches, focal weakness or numbness.  10-point ROS otherwise negative.  ____________________________________________   PHYSICAL EXAM:  VITAL SIGNS: ED Triage Vitals  Enc Vitals Group     BP 10/02/15 2126 186/71 mmHg     Pulse Rate 10/02/15 2126 111     Resp 10/02/15 2126 20     Temp 10/02/15 2126 98.4 F (36.9 C)     Temp Source 10/02/15 2126 Oral     SpO2 10/02/15 2126 97 %  Weight --      Height 10/02/15 2126  (1.575 m)   Constitutional: Alert and oriented. Well appearing and in no distress. Eyes: Conjunctivae are normal. PERRL. Normal extraocular movements. ENT   Head: Normocephalic and atraumatic.   Nose: No congestion/rhinnorhea.   Mouth/Throat: Mucous membranes are moist.   Neck: No stridor. Hematological/Lymphatic/Immunilogical: No cervical lymphadenopathy. Cardiovascular: Normal rate, regular rhythm.  No murmurs, rubs, or gallops. Respiratory: Normal respiratory effort without tachypnea nor retractions. Breath sounds are clear and equal bilaterally. No wheezes/rales/rhonchi. Gastrointestinal: Patient refused. Genitourinary: Deferred Musculoskeletal: Normal range of motion in all extremities. No joint effusions.  No lower extremity tenderness nor edema. Neurologic:  Normal speech and language. No gross focal neurologic deficits are appreciated.  Skin:  Skin is warm,  dry and intact. No rash noted. Psychiatric: Mood and affect are normal. Speech and behavior are normal. Patient exhibits appropriate insight and judgment.  ____________________________________________    LABS (pertinent positives/negatives)  Labs Reviewed  COMPREHENSIVE METABOLIC PANEL - Abnormal; Notable for the following:    Potassium 2.9 (*)    Glucose, Bld 121 (*)    Creatinine, Ser 1.09 (*)    ALT 10 (*)    GFR calc non Af Amer 48 (*)    GFR calc Af Amer 56 (*)    All other components within normal limits  URINALYSIS COMPLETEWITH MICROSCOPIC ([facility] ONLY) - Abnormal; Notable for the following:    Color, Urine YELLOW (*)    APPearance CLOUDY (*)    Hgb urine dipstick 3+ (*)    Nitrite POSITIVE (*)    Bacteria, UA MANY (*)    Squamous Epithelial / LPF 6-30 (*)    All other components within normal limits  ETHANOL  CBC WITH DIFFERENTIAL/PLATELET  URINE DRUG SCREEN, QUALITATIVE ([facility] ONLY)     ____________________________________________   EKG  None  ____________________________________________    RADIOLOGY  None   ____________________________________________   PROCEDURES  Procedure(s) performed: None  Critical Care performed: No  ____________________________________________   INITIAL IMPRESSION / ASSESSMENT AND PLAN / ED COURSE  Pertinent labs & imaging results that were available during my care of the patient were reviewed by me and considered in my medical decision making (see chart for details).  Patient presented to the emergency department today because of concerns for confusion per IVC paperwork. On my exam patient oriented 4. Was slightly emotionally labile. She refused full physical exam. Urine was concerning for urinary tract infection. Will give patient antibiotics. Will have patient be seen by psychiatry.  ____________________________________________   FINAL CLINICAL IMPRESSION(S) / ED DIAGNOSES  Final diagnoses:  Confusion  UTI  (lower urinary tract infection)     Phineas Semen, MD 10/02/15 2353

## 2015-10-02 NOTE — ED Notes (Addendum)
[place] after her niece took out IVC papers on her due to alleged increasing confusion due to advanced dementia, paranoia, and poor living conditions. Pt appears cooperative. Alert answering questions without difficulty. Pt also c/o left hand and knee pain with no known cause. Denies falling.

## 2015-10-03 MED ORDER — POTASSIUM CHLORIDE 20 MEQ PO PACK
PACK | ORAL | Status: AC
  Filled 2015-10-03: qty 2

## 2015-10-03 MED ORDER — POTASSIUM CHLORIDE CRYS ER 20 MEQ PO TBCR
40.0000 meq | EXTENDED_RELEASE_TABLET | Freq: Once | ORAL | Status: AC
  Administered 2015-10-04: 40 meq via ORAL
  Filled 2015-10-03 (×2): qty 2

## 2015-10-03 MED ORDER — POTASSIUM CHLORIDE 20 MEQ/15ML (10%) PO SOLN
40.0000 meq | Freq: Once | ORAL | Status: DC
  Filled 2015-10-03: qty 30

## 2015-10-03 MED ORDER — CIPROFLOXACIN HCL 500 MG PO TABS
500.0000 mg | ORAL_TABLET | Freq: Two times a day (BID) | ORAL | Status: DC
  Administered 2015-10-03 – 2015-10-06 (×6): 500 mg via ORAL
  Filled 2015-10-03 (×6): qty 1

## 2015-10-03 MED ORDER — POTASSIUM CHLORIDE 20 MEQ PO PACK: 40.0000 meq | PACK | Freq: Once | ORAL | Status: DC

## 2015-10-03 NOTE — ED Notes (Addendum)
Pt c/o lower abd cramping, asked the pt if she would like anything for pt, pt states "No I dont like taking pills" explained to the pt if she changes her mind I will assist her.. EDP notified.the patient sister and brother expressed there wishes to [facility].. States [facility] time she was [facility] and they do not want that.I spoke with Jerilynn SomCalvin TTS about the family concerns, and notified the EDP..Marland Kitchen

## 2015-10-03 NOTE — ED Notes (Signed)
Pt given meal tray and orange juice 

## 2015-10-03 NOTE — ED Notes (Signed)
Patient up all night and is now resting per RN do not disturb for vitals

## 2015-10-03 NOTE — ED Provider Notes (Signed)
-----------------------------------------   7:39 AM on 10/03/2015 -----------------------------------------   Blood pressure 130/61, pulse 74, temperature 97.8 F (36.6 C), temperature source Oral, resp. rate 18, height 5\' 2"  (1.575 m), SpO2 95 %.  The patient had no acute events since last update.  Calm and cooperative at this time.  Disposition is pending per Psychiatry/Behavioral Medicine team recommendations.     Jeanmarie PlantJames A McShane, MD 10/03/15 [phone]

## 2015-10-03 NOTE — BH Assessment (Signed)
Assessment Note  Tiffany Turner is an 75 y.o. female. Tiffany Turner arrived to the ED by way of the police under IVC.  She states that she was home looking at the sky in her back yard. She states that she is unsure as to why she was brought to the hospital.  She denied symptoms of depression. She denied symptoms of anxiety.  She denied having auditory or visual hallucinations.  She denied suicidal or homicidal ideation or intent.  She states that her niece was with her at the time of the police arrival. IVC documentation states that the respondent  Has been diagnosed with dementia.  "She does not take her meds as prescribed.  Today she presents herself as confused. She believes that it is 53.  She accuses people  of trying to harm her and barricades herself in the house, where there is dog urine in many parts of the home. She becomes agitated when she does understand .  She is also refusing medical attention given whereby she is suffering from tingling in her hands and arms and her left knee is swoolen due to the severity of her dementia, petitioner requuest" . Ms. Tiffany Turner reported that she was reported to Adult Protective services.[facility]      Diagnosis: Dementia  Past Medical History:  Past Medical History  Diagnosis Date  . Hypertension   . Hyperlipemia   . Coronary artery disease     stents placed  . Hip dislocation, right (HCC)     hx of dislocation    Past Surgical History  Procedure Laterality Date  . Cardiac surgery      stents    Family History: No family history on file.  Social History:  reports that she has been smoking Cigarettes.  She has been smoking about 0.50 packs per day. She does not have any smokeless tobacco history on file. She reports that she does not drink alcohol or use illicit drugs.  Additional Social History:  Alcohol / Drug Use History of alcohol / drug use?: No history of alcohol / drug abuse  CIWA: CIWA-Ar BP: (!) 186/71 mmHg Pulse Rate: (!) 111 COWS:     Allergies:  Allergies  Allergen Reactions  . Penicillins Other (See Comments)  . Sulfa Antibiotics Other (See Comments)    Home Medications:  (Not in a hospital admission)  OB/GYN Status:  No LMP recorded. Patient is postmenopausal.  General Assessment Data Location of Assessment: [facility] TTS Assessment: In system Is this a Tele or Face-to-Face Assessment?: Face-to-Face Is this an Initial Assessment or a Re-assessment for this encounter?: Initial Assessment Marital status: Single Maiden name: Linck Is patient pregnant?: No Pregnancy Status: No Living Arrangements: Alone Can pt return to current living arrangement?: Yes Admission Status: Involuntary Is patient capable of signing voluntary admission?: Yes Referral Source: Self/Family/Friend Insurance type: Medicare  Medical Screening Exam [facility] Walk-in ONLY) Medical Exam completed: Yes  Crisis Care Plan Living Arrangements: Alone Legal Guardian: Other: (Self) Name of Psychiatrist: Denied Name of Therapist: Denied  Education Status Is patient currently in school?: No Current Grade: n/a Highest grade of school patient has completed: 12th Name of school: [employer] person: n/a  Risk to self with the past 6 months Suicidal Ideation: No Has patient been a risk to self within the past 6 months prior to admission? : No Suicidal Intent: No Has patient had any suicidal intent within the past 6 months prior to admission? : No Is patient at risk for  suicide?: No Suicidal Plan?: No Has patient had any suicidal plan within the past 6 months prior to admission? : No Access to Means: No What has been your use of drugs/alcohol within the last 12 months?: denieduse Previous Attempts/Gestures: No How many times?: 0 Other Self Harm Risks: denied Triggers for Past Attempts: None known Intentional Self Injurious Behavior: None Family Suicide History: No Recent stressful life event(s): Other (Comment) (She denied  stressful events) Persecutory voices/beliefs?: No Depression: No Depression Symptoms:  (denied) Substance abuse history and/or treatment for substance abuse?: No Suicide prevention information given to non-admitted patients: Not applicable  Risk to Others within the past 6 months Homicidal Ideation: No Does patient have any lifetime risk of violence toward others beyond the six months prior to admission? : No Thoughts of Harm to Others: No Current Homicidal Intent: No Current Homicidal Plan: No Access to Homicidal Means: No Identified Victim: None identified History of harm to others?: No Assessment of Violence: None Noted Violent Behavior Description: Denied Does patient have access to weapons?: No Criminal Charges Pending?: No Does patient have a court date: No Is patient on probation?: No  Psychosis Hallucinations: None noted Delusions: None noted  Mental Status Report Appearance/Hygiene: In scrubs, Unremarkable Eye Contact: Fair Motor Activity: Unremarkable Speech: Logical/coherent Level of Consciousness: Quiet/awake Mood: Euthymic Affect: Appropriate to circumstance Anxiety Level: None Thought Processes: Coherent Judgement: Unimpaired Orientation: Place, Person, Time, Situation Obsessive Compulsive Thoughts/Behaviors: None  Cognitive Functioning Concentration: Normal Memory: Recent Intact IQ: Average Insight: Good Impulse Control: Fair Appetite: Fair Sleep: No Change Vegetative Symptoms: None  ADLScreening [facility]) Patient's cognitive ability adequate to safely complete daily activities?: Yes Patient able to express need for assistance with ADLs?: Yes Independently performs ADLs?: Yes (appropriate for developmental age)  Prior Inpatient Therapy Prior Inpatient Therapy: Yes Prior Therapy Dates: Unsure [facility] Reason for Treatment: "Stress"  Prior Outpatient Therapy Prior Outpatient Therapy: Yes Prior  Therapy Dates: 2016 Prior Therapy Facilty/Provider(s): Could not remember the name of counselor or agency Reason for Treatment: anxiety Does patient have an ACCT team?: No Does patient have Intensive In-House Services?  : No Does patient have [facility] services? : No Does patient have P4CC services?: No  ADL Screening (condition at time of admission) Patient's cognitive ability adequate to safely complete daily activities?: Yes Patient able to express need for assistance with ADLs?: Yes Independently performs ADLs?: Yes (appropriate for developmental age)       Abuse/Neglect Assessment (Assessment to be complete while patient is alone) Physical Abuse: Denies Verbal Abuse: Denies Sexual Abuse: Denies Exploitation of patient/patient's resources: Denies Self-Neglect: Denies Values / Beliefs Cultural Requests During Hospitalization: None Spiritual Requests During Hospitalization: None   Advance Directives (For Healthcare) Does patient have an advance directive?: Yes    Additional Information 1:1 In Past 12 Months?: No CIRT Risk: No Elopement Risk: No Does patient have medical clearance?: Yes     Disposition:  Disposition Initial Assessment Completed for this Encounter: Yes Disposition of Patient: Other dispositions  On Site Evaluation by:   Reviewed with Physician:    Justice DeedsKeisha Ovide Dusek 10/03/2015 1:14 AM

## 2015-10-03 NOTE — BH Assessment (Signed)
Assessment Note  Tiffany Turner is an 75 y.o. female. Tiffany Turner arrived to the ED by way of the police under IVC.  She states that she was home looking at the sky in her back yard. She states that she is unsure as to why she was brought to the hospital.  She denied symptoms of depression. She denied symptoms of anxiety.  She denied having auditory or visual hallucinations.  She denied suicidal or homicidal ideation or intent.  She states that her niece was with her at the time of the police arrival.  Reports that she incoherent at the time of arrival.  She appeared coherent at the time of the assessment.  IVC documentation reports "Respondent has been diagnosed with dementia. She does not take her meds as prescribed. Today she presented herself as confused.  She belies that it is 22.  She accused people of trying to harm her and barricades herself in the house where there is dog urine in many parts of the home.  She becomes agitated when she does not understand.  She is also refusing medical attention given whereby she is suffering from tingling in her hands and arms and her left knee is swollen due to the severity of her dementia".  Tiffany Turner was reported to APS who initiated the IVC paperwork for the IVC.       Diagnosis: Dementia  Past Medical History:  Past Medical History  Diagnosis Date  . Hypertension   . Hyperlipemia   . Coronary artery disease     stents placed  . Hip dislocation, right (HCC)     hx of dislocation    Past Surgical History  Procedure Laterality Date  . Cardiac surgery      stents    Family History: No family history on file.  Social History:  reports that she has been smoking Cigarettes.  She has been smoking about 0.50 packs per day. She does not have any smokeless tobacco history on file. She reports that she does not drink alcohol or use illicit drugs.  Additional Social History:  Alcohol / Drug Use History of alcohol / drug use?: No history of  alcohol / drug abuse  CIWA: CIWA-Ar BP: (!) 186/71 mmHg Pulse Rate: (!) 111 COWS:    Allergies:  Allergies  Allergen Reactions  . Penicillins Other (See Comments)  . Sulfa Antibiotics Other (See Comments)    Home Medications:  (Not in a hospital admission)  OB/GYN Status:  No LMP recorded. Patient is postmenopausal.  General Assessment Data Location of Assessment: [facility] ED TTS Assessment: In system Is this a Tele or Face-to-Face Assessment?: Face-to-Face Is this an Initial Assessment or a Re-assessment for this encounter?: Initial Assessment Marital status: Single Maiden name: Closser Is patient pregnant?: No Pregnancy Status: No Living Arrangements: Alone Can pt return to current living arrangement?: Yes Admission Status: Involuntary Is patient capable of signing voluntary admission?: Yes Referral Source: Self/Family/Friend Insurance type: Medicare  Medical Screening Exam [facility] Walk-in ONLY) Medical Exam completed: Yes  Crisis Care Plan Living Arrangements: Alone Legal Guardian: Other: (Self) Name of Psychiatrist: Denied Name of Therapist: Denied  Education Status Is patient currently in school?: No Current Grade: n/a Highest grade of school patient has completed: 12th Name of school: [employer] person: n/a  Risk to self with the past 6 months Suicidal Ideation: No Has patient been a risk to self within the past 6 months prior to admission? : No Suicidal Intent: No Has patient  had any suicidal intent within the past 6 months prior to admission? : No Is patient at risk for suicide?: No Suicidal Plan?: No Has patient had any suicidal plan within the past 6 months prior to admission? : No Access to Means: No What has been your use of drugs/alcohol within the last 12 months?: denieduse Previous Attempts/Gestures: No How many times?: 0 Other Self Harm Risks: denied Triggers for Past Attempts: None known Intentional Self Injurious Behavior:  None Family Suicide History: No Recent stressful life event(s): Other (Comment) (She denied stressful events) Persecutory voices/beliefs?: No Depression: No Depression Symptoms:  (denied) Substance abuse history and/or treatment for substance abuse?: No Suicide prevention information given to non-admitted patients: Not applicable  Risk to Others within the past 6 months Homicidal Ideation: No Does patient have any lifetime risk of violence toward others beyond the six months prior to admission? : No Thoughts of Harm to Others: No Current Homicidal Intent: No Current Homicidal Plan: No Access to Homicidal Means: No Identified Victim: None identified History of harm to others?: No Assessment of Violence: None Noted Violent Behavior Description: Denied Does patient have access to weapons?: No Criminal Charges Pending?: No Does patient have a court date: No Is patient on probation?: No  Psychosis Hallucinations: None noted Delusions: None noted  Mental Status Report Appearance/Hygiene: In scrubs, Unremarkable Eye Contact: Fair Motor Activity: Unremarkable Speech: Logical/coherent Level of Consciousness: Quiet/awake Mood: Euthymic Affect: Appropriate to circumstance Anxiety Level: None Thought Processes: Coherent Judgement: Unimpaired Orientation: Place, Person, Time, Situation Obsessive Compulsive Thoughts/Behaviors: None  Cognitive Functioning Concentration: Normal Memory: Recent Intact IQ: Average Insight: Good Impulse Control: Fair Appetite: Fair Sleep: No Change Vegetative Symptoms: None  ADLScreening [facility] Assessment Services) Patient's cognitive ability adequate to safely complete daily activities?: Yes Patient able to express need for assistance with ADLs?: Yes Independently performs ADLs?: Yes (appropriate for developmental age)  Prior Inpatient Therapy Prior Inpatient Therapy: Yes Prior Therapy Dates: Unsure Prior [facility] Reason for Treatment: "Stress"  Prior Outpatient Therapy Prior Outpatient Therapy: Yes Prior Therapy Dates: 2016 Prior Therapy Facilty/Provider(s): Could not remember the name of counselor or agency Reason for Treatment: anxiety Does patient have an ACCT team?: No Does patient have Intensive In-House Services?  : No Does patient have [facility] services? : No Does patient have P4CC services?: No  ADL Screening (condition at time of admission) Patient's cognitive ability adequate to safely complete daily activities?: Yes Patient able to express need for assistance with ADLs?: Yes Independently performs ADLs?: Yes (appropriate for developmental age)       Abuse/Neglect Assessment (Assessment to be complete while patient is alone) Physical Abuse: Denies Verbal Abuse: Denies Sexual Abuse: Denies Exploitation of patient/patient's resources: Denies Self-Neglect: Denies Values / Beliefs Cultural Requests During Hospitalization: None Spiritual Requests During Hospitalization: None   Advance Directives (For Healthcare) Does patient have an advance directive?: Yes    Additional Information 1:1 In Past 12 Months?: No CIRT Risk: No Elopement Risk: No Does patient have medical clearance?: Yes     Disposition:  Disposition Initial Assessment Completed for this Encounter: Yes Disposition of Patient: Other dispositions  On Site Evaluation by:   Reviewed with Physician:    Justice DeedsKeisha Daryn Hicks 10/03/2015 1:45 AM

## 2015-10-03 NOTE — ED Notes (Signed)
Pt was unable to take the pill form of KDur, order changed to liquid form, pt did not like the taste of the medication, informed pt of her low K+ levels and the risk, pt asked to see the package, packet given to pt, pt reads it multiple times and asked why it taste so salty, is arguing and refusing to take the medication.. Pt asked to speak with the doctor about why she needs to take the medication, EDP notified.

## 2015-10-04 DIAGNOSIS — F0281 Dementia in other diseases classified elsewhere with behavioral disturbance: Secondary | ICD-10-CM

## 2015-10-04 DIAGNOSIS — G300 Alzheimer's disease with early onset: Secondary | ICD-10-CM

## 2015-10-04 MED ORDER — RISPERIDONE 0.5 MG PO TABS
0.2500 mg | ORAL_TABLET | Freq: Two times a day (BID) | ORAL | Status: DC
  Administered 2015-10-05 – 2015-10-06 (×2): 0.25 mg via ORAL
  Filled 2015-10-04 (×4): qty 1

## 2015-10-04 MED ORDER — DONEPEZIL HCL 5 MG PO TABS
5.0000 mg | ORAL_TABLET | Freq: Every day | ORAL | Status: DC
  Administered 2015-10-05: 5 mg via ORAL
  Filled 2015-10-04 (×2): qty 1

## 2015-10-04 NOTE — [facility] Counselor (Signed)
[facility] called and is reviewing pt.  Tiffany Vizcarrondo, MS, CRC, LPC [facility] Triage Specialist [place] 

## 2015-10-04 NOTE — BH Assessment (Signed)
Per Dr. Arnold LongFaheen, patient meets criteria for inpatient hospitalization.  [facility]

## 2015-10-04 NOTE — ED Notes (Signed)
Pt given graham crackers and ice water at this time. 

## 2015-10-04 NOTE — Consult Note (Signed)
[facility] Face-to-Face Psychiatry Consult   Reason for Consult:  Paranoia Referring Physician: EDP  Patient Identification: Tiffany Turner MRN:  [id number] [place]  dementia with behavioral disturbances Diagnosis:   Patient Active Problem List   Diagnosis Date Noted  . 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 Hypertension [I10] 02/16/2015    Total Time spent with patient: 1 hour  Subjective:   Tiffany Turner is a 75 y.o. female patient presented to the emergency room under involuntary commitment. She was petitioned by the adult protective services  HPI:   Tiffany Turner is an 75 y.o. Female with history  dementia arrived to the ED by way of the police under IVC.Most of the history was obtained from the patient as well as review of her chart. According to the records patient was brought to the hospital as she has been hallucinating having paranoia and was accusing people of trying to harm her at [place] herself in her house. There were dog urine in many parts of her home. Her home is not in a condition that she can safely reside She is living by herself and has not been taking her medications as prescribed. Patient has also been refusing medical care and has been suffering from tingling in her hands and arms and her left knee is swollen. During my interview patient was lying in the bed. She reported that she is in the hospital and does not know the reason why she came to the hospital. Then she asked me that why I'm asking all these questions and if I'm really a psychiatrist. That she has had who gave the permission to the emergency room physician to take care of her. She reported that when she came here last time she was seen by other psychiatrists. She reported that she buys frozen dinner and goes to [employer].  Patient did not allow me to communicate with any of her family members to obtain collateral information  She  remained agitated throughout the interview and was very short answers.  She believes that it is 24.  she is unable to take care of herself and is noncompliant with her medications.    Past psychiatric history: Patient has history of dementia and was previously admitted to [facility]. She was discharged on Risperdal. No history of suicide attempts. No other history of psychiatric diagnosis.  Social history: Patient lives by herself with her dog. She lives a very short distance from her son. They live [place] the [place]. Son's family is there as well.  Medical history: History of coronary artery disease. 2 stents in her heart. History of high blood pressure and elevated cholesterol. Says that she is currently awaiting knee surgery and eye surgery at [facility].  Family history: Denies any family history of mental health problems at all.    Risk to Self: Suicidal Ideation: No Suicidal Intent: No Is patient at risk for suicide?: No Suicidal Plan?: No Access to Means: No What has been your use of drugs/alcohol within the last 12 months?: denieduse How many times?: 0 Other Self Harm Risks: denied Triggers for Past Attempts: None known Intentional Self Injurious Behavior: None Risk to Others: Homicidal Ideation: No Thoughts of Harm to Others: No Current Homicidal Intent: No Current Homicidal Plan: No Access to Homicidal Means: No Identified Victim: None identified History of harm to others?: No Assessment of Violence: None Noted Violent Behavior Description: Denied Does patient have access to weapons?: No Criminal Charges Pending?:  No Does patient have a court date: No Prior Inpatient Therapy: Prior Inpatient Therapy: Yes Prior Therapy Dates: Unsure Prior Therapy Facilty/Provider(s): [facility] Reason for Treatment: "Stress" Prior Outpatient Therapy: Prior Outpatient Therapy: Yes Prior Therapy Dates: 2016 Prior Therapy Facilty/Provider(s): Could not remember the name  of counselor or agency Reason for Treatment: anxiety Does patient have an ACCT team?: No Does patient have Intensive In-House Services?  : No Does patient have [facility] services? : No Does patient have P4CC services?: No  Past Medical History:  Past Medical History  Diagnosis Date  . Hypertension   . Hyperlipemia   . Coronary artery disease     stents placed  . Hip dislocation, right (HCC)     hx of dislocation    Past Surgical History  Procedure Laterality Date  . Cardiac surgery      stents   Family History: No family history on file. Family Psychiatric  History: No mental illness Social History:  History  Alcohol Use No     History  Drug Use No    Social History   Social History  . Marital Status: Single    Spouse Name: N/A  . Number of Children: N/A  . Years of Education: N/A   Social History Main Topics  . Smoking status: Current Every Day Smoker -- 0.50 packs/day    Types: Cigarettes  . Smokeless tobacco: Not on file  . Alcohol Use: No  . Drug Use: No  . Sexual Activity: Not on file   Other Topics Concern  . Not on file   Social History Narrative   Additional Social History:    Allergies:   Allergies  Allergen Reactions  . Penicillins Other (See Comments)  . Sulfa Antibiotics Other (See Comments)    Labs:  Results for orders placed or performed during the hospital encounter of 10/02/15 (from the past 48 hour(s))  Comprehensive metabolic panel     Status: Abnormal   Collection Time: 10/02/15  9:32 PM  Result Value Ref Range   Sodium 139 135 - 145 mmol/L   Potassium 2.9 (LL) 3.5 - 5.1 mmol/L    Comment: CRITICAL RESULT CALLED TO, READ BACK BY AND VERIFIED WITH [facility] OLLEJAR AT 2206 10/02/15 MLZ    Chloride 107 101 - 111 mmol/L   CO2 24 22 - 32 mmol/L   Glucose, Bld 121 (H) 65 - 99 mg/dL   BUN 13 6 - 20 mg/dL   Creatinine, Ser 1.09 (H) 0.44 - 1.00 mg/dL   Calcium 9.2 8.9 - 10.3 mg/dL   Total Protein 7.3 6.5 - 8.1 g/dL   Albumin 3.8 3.5 -  5.0 g/dL   AST 21 15 - 41 U/L   ALT 10 (L) 14 - 54 U/L   Alkaline Phosphatase 72 38 - 126 U/L   Total Bilirubin 0.6 0.3 - 1.2 mg/dL   GFR calc non Af Amer 48 (L) >60 mL/min   GFR calc Af Amer 56 (L) >60 mL/min    Comment: (NOTE) The eGFR has been calculated using the CKD EPI equation. This calculation has not been validated in all clinical situations. eGFR's persistently <60 mL/min signify possible Chronic Kidney Disease.    Anion gap 8 5 - 15  Ethanol     Status: None   Collection Time: 10/02/15  9:32 PM  Result Value Ref Range   Alcohol, Ethyl (B) <5 <5 mg/dL    Comment:        LOWEST DETECTABLE LIMIT FOR SERUM ALCOHOL IS  5 mg/dL FOR MEDICAL PURPOSES ONLY   CBC with Diff     Status: None   Collection Time: 10/02/15  9:32 PM  Result Value Ref Range   WBC 9.8 3.6 - 11.0 K/uL   RBC 3.90 3.80 - 5.20 MIL/uL   Hemoglobin 12.9 12.0 - 16.0 g/dL   HCT 38.2 35.0 - 47.0 %   MCV 97.9 80.0 - 100.0 fL   MCH 33.1 26.0 - 34.0 pg   MCHC 33.8 32.0 - 36.0 g/dL   RDW 13.2 11.5 - 14.5 %   Platelets 315 150 - 440 K/uL    Comment: SLIDE SCANNED. NO CLUMPS PRESENT. KLK   Neutrophils Relative % 60% %   Neutro Abs 5.9 1.4 - 6.5 K/uL   Lymphocytes Relative 31% %   Lymphs Abs 3.0 1.0 - 3.6 K/uL   Monocytes Relative 7% %   Monocytes Absolute 0.7 0.2 - 0.9 K/uL   Eosinophils Relative 1% %   Eosinophils Absolute 0.1 0 - 0.7 K/uL   Basophils Relative 1% %   Basophils Absolute 0.1 0 - 0.1 K/uL  Urine Drug Screen, Qualitative ([facility] only)     Status: None   Collection Time: 10/02/15  9:32 PM  Result Value Ref Range   Tricyclic, Ur Screen NONE DETECTED NONE DETECTED   Amphetamines, Ur Screen NONE DETECTED NONE DETECTED   MDMA (Ecstasy)Ur Screen NONE DETECTED NONE DETECTED   Cocaine Metabolite,Ur [place] NONE DETECTED NONE DETECTED   Opiate, Ur Screen NONE DETECTED NONE DETECTED   Phencyclidine (PCP) Ur S NONE DETECTED NONE DETECTED   Cannabinoid 50 Ng, Ur [place] NONE DETECTED NONE DETECTED    Barbiturates, Ur Screen NONE DETECTED NONE DETECTED   Benzodiazepine, Ur Scrn NONE DETECTED NONE DETECTED   Methadone Scn, Ur NONE DETECTED NONE DETECTED    Comment: (NOTE) 914  Tricyclics, urine               Cutoff 1000 ng/mL 200  Amphetamines, urine             Cutoff 1000 ng/mL 300  MDMA (Ecstasy), urine           Cutoff 500 ng/mL 400  Cocaine Metabolite, urine       Cutoff 300 ng/mL 500  Opiate, urine                   Cutoff 300 ng/mL 600  Phencyclidine (PCP), urine      Cutoff 25 ng/mL 700  Cannabinoid, urine              Cutoff 50 ng/mL 800  Barbiturates, urine             Cutoff 200 ng/mL 900  Benzodiazepine, urine           Cutoff 200 ng/mL 1000 Methadone, urine                Cutoff 300 ng/mL 1100 1200 The urine drug screen provides only a preliminary, unconfirmed 1300 analytical test result and should not be used for non-medical 1400 purposes. Clinical consideration and professional judgment should 1500 be applied to any positive drug screen result due to possible 1600 interfering substances. A more specific alternate chemical method 1700 must be used in order to obtain a confirmed analytical result.  1800 Gas chromato graphy / mass spectrometry (GC/MS) is the preferred 1900 confirmatory method.   Urinalysis complete, with microscopic [facility] only)     Status: Abnormal   Collection Time: 10/02/15  9:32 PM  Result Value Ref Range   Color, Urine YELLOW (A) YELLOW   APPearance CLOUDY (A) CLEAR   Glucose, UA NEGATIVE NEGATIVE mg/dL   Bilirubin Urine NEGATIVE NEGATIVE   Ketones, ur NEGATIVE NEGATIVE mg/dL   Specific Gravity, Urine 1.019 1.005 - 1.030   Hgb urine dipstick 3+ (A) NEGATIVE   pH 5.0 5.0 - 8.0   Protein, ur NEGATIVE NEGATIVE mg/dL   Nitrite POSITIVE (A) NEGATIVE   Leukocytes, UA NEGATIVE NEGATIVE   RBC / HPF 6-30 0 - 5 RBC/hpf   WBC, UA 6-30 0 - 5 WBC/hpf   Bacteria, UA MANY (A) NONE SEEN   Squamous Epithelial / LPF 6-30 (A) NONE SEEN   Mucous PRESENT     Hyaline Casts, UA PRESENT    Ca Oxalate Crys, UA PRESENT   Urine culture     Status: None (Preliminary result)   Collection Time: 10/02/15  9:32 PM  Result Value Ref Range   Specimen Description URINE, RANDOM    Special Requests NONE    Culture      >=100,000 COLONIES/mL GRAM NEGATIVE RODS IDENTIFICATION AND SUSCEPTIBILITIES TO FOLLOW    Report Status PENDING     Current Facility-Administered Medications  Medication Dose Route Frequency Provider Last Rate Last Dose  . ciprofloxacin (CIPRO) tablet 500 mg  500 mg Oral BID Schuyler Amor, MD   500 mg at 10/04/15 0747  . donepezil (ARICEPT) tablet 5 mg  5 mg Oral QHS Rainey Pines, MD      . potassium chloride (KLOR-CON) packet 40 mEq  40 mEq Oral Once Loney Hering, MD   0 mEq at 10/04/15 1012  . risperiDONE (RISPERDAL) tablet 0.25 mg  0.25 mg Oral BID Rainey Pines, MD       Current Outpatient Prescriptions  Medication Sig Dispense Refill  . risperiDONE (RISPERDAL) 0.5 MG tablet Take 1 tablet (0.5 mg total) by mouth at bedtime. (Patient not taking: Reported on 10/04/2015) 30 tablet 0    Musculoskeletal: Strength & Muscle Tone: within normal limits Gait & Station: unsteady Patient leans: N/A  Psychiatric Specialty Exam: Review of Systems  Psychiatric/Behavioral: Positive for hallucinations. The patient is nervous/anxious.     Blood pressure 148/64, pulse 81, temperature 97.8 F (36.6 C), temperature source Oral, resp. rate 18, height _0  (1.575 m), SpO2 97 %.There is no weight on file to calculate BMI.  General Appearance: Disheveled  Eye Sport and exercise psychologist::  Fair  Speech:  Garbled  Volume:  Decreased  Mood:  Anxious and Dysphoric  Affect:  Congruent  Thought Process:  Disorganized  Orientation:  Other:  confused  Thought Content:  Paranoid Ideation  Suicidal Thoughts:  No  Homicidal Thoughts:  No  Memory:  impaired  Judgement:  Impaired  Insight:  Lacking  Psychomotor Activity:  Psychomotor Retardation  Concentration:  Poor   Recall:  Poor  Fund of Knowledge:Poor  Language: Fair  Akathisia:  No  Handed:  Right  AIMS (if indicated):     Assets:  Housing  ADL's:  Intact  Cognition: Impaired,  Moderate  Sleep:      Treatment Plan Summary: Medication management  Disposition: Recommend psychiatric Inpatient admission when medically cleared.   Patient is unable to take care of herself. She has worsening of his paranoia related to her dementia. She was also not allowing [place] to contact her family members to obtain collateral information. She will be admitted to the inpatient geropsychiatric unit was she becomes medically stable I will start her on Risperdal 0.25  mg twice a day to control her paranoia She will also be started on Aricept 5 mg daily for her dementia We will continue to monitor Thank you for allowing me to participate in the care of this patient    More than 50% of the time spent in psychoeducation, counseling and coordination of care.    This note was generated in part or whole with voice recognition software. Voice regonition is usually quite accurate but there are transcription errors that can and very often do occur. I apologize for any typographical errors that were not detected and corrected.   Rainey Pines, MD 10/04/2015 1:12 PM

## 2015-10-04 NOTE — ED Provider Notes (Signed)
-----------------------------------------   8:07 AM on 10/04/2015 -----------------------------------------   Blood pressure 148/64, pulse 81, temperature 97.8 F (36.6 C), temperature source Oral, resp. rate 18, height 5\' 2"  (1.575 m), SpO2 97 %.  The patient is in no acute distress. Calm and cooperative at this time.  Disposition is pending per Psychiatry/Behavioral Medicine team recommendations.     Jeanmarie PlantJames A Izamar Linden, MD 10/04/15 [phone]

## 2015-10-04 NOTE — ED Notes (Signed)
Pt given dinner tray. She ate some and requests to save the rest for later. Pt states she is "feeling fine." NAD noted. Door left open so pt is in sight of NT and law enforcement.

## 2015-10-04 NOTE — BH Assessment (Signed)
Per Dr.Faheem, pt is to be held for fact to face Psych MD Consult. Writer informed Pt RN, (Elizabeth/Ann) of pt disposition.

## 2015-10-04 NOTE — ED Notes (Signed)
BEHAVIORAL HEALTH ROUNDING  Patient sleeping: No.  Patient alert and oriented: yes to her baseline Behavior appropriate: Yes. ; If no, describe:  Nutrition and fluids offered: Yes  Toileting and hygiene offered: Yes  Sitter present: not applicable, Q 15 min safety rounds and observation.  Law enforcement present: Yes ODS  

## 2015-10-04 NOTE — ED Notes (Signed)
Pt given breakfast tray. Pt ate the biscuit, but removed the egg.

## 2015-10-04 NOTE — ED Notes (Addendum)
BEHAVIORAL HEALTH ROUNDING Patient sleeping: Yes.   Patient alert and oriented: not applicable SLEEPING Behavior appropriate: Yes.  ; If no, describe: SLEEPING Nutrition and fluids offered: No SLEEPING Toileting and hygiene offered: NoSLEEPING Sitter present: not applicable, Q 15 min safety rounds and observation. Law enforcement present: Yes ODS 

## 2015-10-04 NOTE — ED Notes (Signed)
Pt's sister in to visit with pt; APS worker Max Fickle(Tina Reese) called for update on patient.

## 2015-10-04 NOTE — ED Notes (Signed)
BEHAVIORAL HEALTH ROUNDING  Patient sleeping: No.  Patient alert and oriented: yes  Behavior appropriate: Yes. ; If no, describe:  Nutrition and fluids offered: Yes  Toileting and hygiene offered: Yes  Sitter present: not applicable, Q 15 min safety rounds and observation.  Law enforcement present: Yes ODS  

## 2015-10-04 NOTE — BH Assessment (Signed)
Writer [facility] the [facility]

## 2015-10-05 ENCOUNTER — Emergency Department: Payer: Medicare Other

## 2015-10-05 DIAGNOSIS — F0390 Unspecified dementia without behavioral disturbance: Secondary | ICD-10-CM | POA: Diagnosis not present

## 2015-10-05 LAB — BASIC METABOLIC PANEL
Anion gap: 5 (ref 5–15)
BUN: 10 mg/dL (ref 6–20)
CALCIUM: 8.7 mg/dL — AB (ref 8.9–10.3)
CHLORIDE: 108 mmol/L (ref 101–111)
CO2: 26 mmol/L (ref 22–32)
CREATININE: 0.95 mg/dL (ref 0.44–1.00)
GFR calc non Af Amer: 57 mL/min — ABNORMAL LOW (ref >60)
Glucose, Bld: 100 mg/dL — ABNORMAL HIGH (ref 65–99)
Potassium: 3.5 mmol/L (ref 3.5–5.1)
SODIUM: 139 mmol/L (ref 135–145)

## 2015-10-05 LAB — URINE CULTURE: Culture: >=100000

## 2015-10-05 NOTE — ED Notes (Signed)
BEHAVIORAL HEALTH ROUNDING Patient sleeping: No. Patient alert and oriented: yes Behavior appropriate: Yes.  ; If no, describe:  Nutrition and fluids offered: yes Toileting and hygiene offered: Yes  Sitter present: q15 minute observations and security  monitoring Law enforcement present: Yes  ODS  

## 2015-10-05 NOTE — ED Notes (Signed)
Per Sheriff's transport c com pt will not be transported until 4/1 0700

## 2015-10-05 NOTE — ED Notes (Signed)
Pt walking to restroom with assistance from tech

## 2015-10-05 NOTE — ED Notes (Signed)
Called for transport to call back  [phone]

## 2015-10-05 NOTE — ED Notes (Signed)
BEHAVIORAL HEALTH ROUNDING  Patient sleeping: No.  Patient alert and oriented: yes  Behavior appropriate: Yes. ; If no, describe:  Nutrition and fluids offered: Yes  Toileting and hygiene offered: Yes  Sitter present: not applicable, Q 15 min safety rounds and observation.  Law enforcement present: Yes ODS  

## 2015-10-05 NOTE — BH Assessment (Signed)
Referral information for Geriatric Placement have been faxed to;    [facility]([phone]),    [phone]),    [phone]),    [phone]),    [facility] [phone]   [phone]),    [phone]),    [facility] [phone]),    [facility] [phone])   [facility] [phone])   [facility]   [facility]   [place]   [facility]   [facility]

## 2015-10-05 NOTE — ED Notes (Signed)
ED BHU PLACEMENT JUSTIFICATION Is the patient under IVC or is there intent for IVC: Yes.   Is the patient medically cleared: Yes.   Is there vacancy in the ED BHU: Yes.   Is the population mix appropriate for patient: - pt is geriatric and ambulates with an unsteady gait  Is the patient awaiting placement in inpatient or outpatient setting: Yes.  - geripsych unit  Has the patient had a psychiatric consult: Yes.   Survey of unit performed for contraband, proper placement and condition of furniture, tampering with fixtures in bathroom, shower, and each patient room: Yes.  ; Findings:  APPEARANCE/BEHAVIOR Calm and cooperative NEURO ASSESSMENT Orientation: oriented to self and place Denies pain Hallucinations: No.None noted (Hallucinations) Speech: Normal Gait: normal RESPIRATORY ASSESSMENT Even  Unlabored respirations  CARDIOVASCULAR ASSESSMENT Pulses equal   regular rate  Skin warm and dry   GASTROINTESTINAL ASSESSMENT no GI complaint EXTREMITIES Full ROM  PLAN OF CARE Provide calm/safe environment. Vital signs assessed twice daily. ED BHU Assessment once each 12-hour shift. Collaborate with intake RN daily or as condition indicates. Assure the ED provider has rounded once each shift. Provide and encourage hygiene. Provide redirection as needed. Assess for escalating behavior; address immediately and inform ED provider.  Assess family dynamic and appropriateness for visitation as needed: Yes.  ; If necessary, describe findings:  Educate the patient/family about BHU procedures/visitation: Yes.  ; If necessary, describe findings:

## 2015-10-05 NOTE — ED Notes (Signed)
Pt observed ambulating in her room - NAD assessed  Continue to monitor

## 2015-10-05 NOTE — ED Notes (Signed)

## 2015-10-05 NOTE — ED Notes (Signed)

## 2015-10-05 NOTE — ED Notes (Signed)
Breakfast provided - pt observed sitting up in bed  NAD assessed

## 2015-10-05 NOTE — ED Provider Notes (Signed)
-----------------------------------------   6:44 PM on 10/05/2015 -----------------------------------------   Blood pressure 140/61, pulse 76, temperature 98.8 F (37.1 C), temperature source Oral, resp. rate 18, height 5\' 2"  (1.575 m), SpO2 99 %.  The patient had no acute events since last update.  Calm and cooperative at this time.  [facility].   Myrna Blazeravid Matthew Schaevitz, MD 10/05/15 [phone]

## 2015-10-05 NOTE — BH Assessment (Signed)
Patient has been accepted to [facility]. Accepting physician is Dr. Festus AloeVezer.  Call report to [phone].  Representative was [employer].  ER Staff is aware of it Valeda Malm(Luane, ER Sect.; Dr. Pershing ProudSchaevitz, ER MD & Amy, Patient's Nurse)     Per nursing staff (Amy T., RN), the patient requested that her family aren't told about her treatment and what is going with her in the hospital.

## 2015-10-05 NOTE — ED Provider Notes (Signed)
-----------------------------------------   6:23 AM on 10/05/2015 -----------------------------------------   Blood pressure 162/79, pulse 84, temperature 97.9 F (36.6 C), temperature source Oral, resp. rate 18, height 5\' 2"  (1.575 m), SpO2 97 %.  The patient had no acute events since last update.  Calm and cooperative at this time.  Repeat potassium now within normal limits. Disposition is pending per Psychiatry/Behavioral Medicine team recommendations.     Irean HongJade J Sung, MD 10/05/15 [phone]

## 2015-10-05 NOTE — ED Notes (Signed)
Pt walking to restroom with assistance from tech  

## 2015-10-05 NOTE — ED Notes (Signed)
Patient observed lying in bed with eyes closed  Even, unlabored respirations observed   NAD pt appears to be sleeping  I will continue to monitor along with every 15 minute visual observations and ongoing security monitoring    

## 2015-10-05 NOTE — ED Notes (Signed)
Supper provided  

## 2015-10-05 NOTE — ED Notes (Signed)
Pt awake and sitting on side of the bed, this tech turned tv on for pt and pt remains sitting comfortably on bed at this time, will continue to monitor pt

## 2015-10-05 NOTE — ED Notes (Signed)
Am meds administered as ordered.

## 2015-10-05 NOTE — BH Assessment (Signed)
Writer received phone call from ER [facility] Meigs([place]), stating sheriff department will not being able to transport the patient until tomorrow (10/06/2015).   Writer called [facility] ([phone] [phone]) and informed them.

## 2015-10-06 ENCOUNTER — Encounter: Payer: Self-pay | Admitting: Emergency Medicine

## 2015-10-06 DIAGNOSIS — F0280 Dementia in other diseases classified elsewhere without behavioral disturbance: Secondary | ICD-10-CM | POA: Diagnosis not present

## 2015-10-06 DIAGNOSIS — R410 Disorientation, unspecified: Secondary | ICD-10-CM | POA: Diagnosis not present

## 2015-10-06 DIAGNOSIS — F17210 Nicotine dependence, cigarettes, uncomplicated: Secondary | ICD-10-CM | POA: Diagnosis not present

## 2015-10-06 DIAGNOSIS — I672 Cerebral atherosclerosis: Secondary | ICD-10-CM | POA: Diagnosis not present

## 2015-10-06 DIAGNOSIS — F0281 Dementia in other diseases classified elsewhere with behavioral disturbance: Secondary | ICD-10-CM | POA: Diagnosis not present

## 2015-10-06 DIAGNOSIS — N390 Urinary tract infection, site not specified: Secondary | ICD-10-CM | POA: Diagnosis not present

## 2015-10-06 DIAGNOSIS — G309 Alzheimer's disease, unspecified: Secondary | ICD-10-CM | POA: Diagnosis not present

## 2015-10-06 DIAGNOSIS — I10 Essential (primary) hypertension: Secondary | ICD-10-CM | POA: Diagnosis not present

## 2015-10-06 DIAGNOSIS — F0151 Vascular dementia with behavioral disturbance: Secondary | ICD-10-CM | POA: Diagnosis not present

## 2015-10-06 DIAGNOSIS — I2510 Atherosclerotic heart disease of native coronary artery without angina pectoris: Secondary | ICD-10-CM | POA: Diagnosis not present

## 2015-10-06 NOTE — ED Notes (Signed)
Pt discharged into sheriff's care for transport to [facility]

## 2015-10-06 NOTE — ED Notes (Signed)
Sheriff's department on their way - nurse emma had the pt sign and gave pt her belongings to get dressed.

## 2015-10-06 NOTE — ED Provider Notes (Signed)
-----------------------------------------   6:37 AM on 10/06/2015 -----------------------------------------   Blood pressure 143/83, pulse 85, temperature 97.6 F (36.4 C), temperature source Oral, resp. rate 20, height 5\' 2"  (1.575 m), SpO2 95 %.  The patient had no acute events since last update.  Calm and cooperative at this time.  Disposition is pending [facility], which should hopefully be available today.  Patient is treated for urinary tract infection, sensitivities demonstrated should be sensitive to Cipro which she is treated with. Potassium has improved to normal now.   Sharyn CreamerMark Serrita Lueth, MD 10/06/15 [phone]

## 2015-10-06 NOTE — ED Notes (Signed)
Patient resting with eyes closed. Even respirations noted. 

## 2015-10-06 NOTE — ED Notes (Signed)
Patient given breakfast tray. Patient resting at this time.

## 2015-10-06 NOTE — ED Notes (Signed)
Patient assisted to restroom. Steady with use of cane.

## 2015-12-25 ENCOUNTER — Emergency Department: Payer: Medicare Other

## 2015-12-25 ENCOUNTER — Emergency Department
Admission: EM | Admit: 2015-12-25 | Discharge: 2015-12-27 | Disposition: A | Discharge status: Other Acute Inpatient Hospital | Payer: Medicare Other | Attending: Emergency Medicine | Admitting: Emergency Medicine

## 2015-12-25 ENCOUNTER — Encounter: Payer: Self-pay | Admitting: *Deleted

## 2015-12-25 DIAGNOSIS — Z9861 Coronary angioplasty status: Secondary | ICD-10-CM | POA: Insufficient documentation

## 2015-12-25 DIAGNOSIS — F02818 Dementia in other diseases classified elsewhere, unspecified severity, with other behavioral disturbance: Secondary | ICD-10-CM | POA: Diagnosis present

## 2015-12-25 DIAGNOSIS — I2510 Atherosclerotic heart disease of native coronary artery without angina pectoris: Secondary | ICD-10-CM | POA: Insufficient documentation

## 2015-12-25 DIAGNOSIS — Z79899 Other long term (current) drug therapy: Secondary | ICD-10-CM | POA: Insufficient documentation

## 2015-12-25 DIAGNOSIS — M25551 Pain in right hip: Secondary | ICD-10-CM

## 2015-12-25 DIAGNOSIS — F0391 Unspecified dementia with behavioral disturbance: Secondary | ICD-10-CM | POA: Diagnosis not present

## 2015-12-25 DIAGNOSIS — M25559 Pain in unspecified hip: Secondary | ICD-10-CM | POA: Diagnosis not present

## 2015-12-25 DIAGNOSIS — I10 Essential (primary) hypertension: Secondary | ICD-10-CM | POA: Insufficient documentation

## 2015-12-25 DIAGNOSIS — E785 Hyperlipidemia, unspecified: Secondary | ICD-10-CM | POA: Insufficient documentation

## 2015-12-25 DIAGNOSIS — F17210 Nicotine dependence, cigarettes, uncomplicated: Secondary | ICD-10-CM | POA: Insufficient documentation

## 2015-12-25 DIAGNOSIS — G300 Alzheimer's disease with early onset: Secondary | ICD-10-CM

## 2015-12-25 DIAGNOSIS — R0989 Other specified symptoms and signs involving the circulatory and respiratory systems: Secondary | ICD-10-CM | POA: Diagnosis not present

## 2015-12-25 DIAGNOSIS — F0281 Dementia in other diseases classified elsewhere with behavioral disturbance: Secondary | ICD-10-CM

## 2015-12-25 DIAGNOSIS — R4182 Altered mental status, unspecified: Secondary | ICD-10-CM

## 2015-12-25 DIAGNOSIS — N390 Urinary tract infection, site not specified: Secondary | ICD-10-CM | POA: Diagnosis not present

## 2015-12-25 LAB — COMPREHENSIVE METABOLIC PANEL
ALT: 8 U/L — AB (ref 14–54)
AST: 14 U/L — ABNORMAL LOW (ref 15–41)
Albumin: 3.3 g/dL — ABNORMAL LOW (ref 3.5–5.0)
Alkaline Phosphatase: 61 U/L (ref 38–126)
Anion gap: 8 (ref 5–15)
BUN: 14 mg/dL (ref 6–20)
CHLORIDE: 108 mmol/L (ref 101–111)
CO2: 25 mmol/L (ref 22–32)
CREATININE: 1.07 mg/dL — AB (ref 0.44–1.00)
Calcium: 8.6 mg/dL — ABNORMAL LOW (ref 8.9–10.3)
GFR, EST AFRICAN AMERICAN: 57 mL/min — AB (ref >60)
GFR, EST NON AFRICAN AMERICAN: 49 mL/min — AB (ref >60)
Glucose, Bld: 102 mg/dL — ABNORMAL HIGH (ref 65–99)
POTASSIUM: 3.2 mmol/L — AB (ref 3.5–5.1)
SODIUM: 141 mmol/L (ref 135–145)
Total Bilirubin: 0.8 mg/dL (ref 0.3–1.2)
Total Protein: 6.5 g/dL (ref 6.5–8.1)

## 2015-12-25 LAB — CBC
HCT: 33.4 % — ABNORMAL LOW (ref 35.0–47.0)
Hemoglobin: 11.6 g/dL — ABNORMAL LOW (ref 12.0–16.0)
MCH: 33.5 pg (ref 26.0–34.0)
MCHC: 34.7 g/dL (ref 32.0–36.0)
MCV: 96.7 fL (ref 80.0–100.0)
PLATELETS: 260 10*3/uL (ref 150–440)
RBC: 3.45 MIL/uL — AB (ref 3.80–5.20)
RDW: 13 % (ref 11.5–14.5)
WBC: 9.1 10*3/uL (ref 3.6–11.0)

## 2015-12-25 LAB — ETHANOL

## 2015-12-25 MED ORDER — HALOPERIDOL LACTATE 5 MG/ML IJ SOLN
2.5000 mg | Freq: Once | INTRAMUSCULAR | Status: AC
  Administered 2015-12-25: 2.5 mg via INTRAMUSCULAR

## 2015-12-25 MED ORDER — POTASSIUM CHLORIDE CRYS ER 20 MEQ PO TBCR
40.0000 meq | EXTENDED_RELEASE_TABLET | Freq: Once | ORAL | Status: AC
  Administered 2015-12-26: 40 meq via ORAL
  Filled 2015-12-25: qty 2

## 2015-12-25 MED ORDER — TUBERCULIN PPD 5 UNIT/0.1ML ID SOLN
5.0000 [IU] | Freq: Once | INTRADERMAL | Status: DC
  Filled 2015-12-25 (×2): qty 0.1

## 2015-12-25 MED ORDER — HALOPERIDOL LACTATE 5 MG/ML IJ SOLN
INTRAMUSCULAR | Status: AC
  Administered 2015-12-25: 2.5 mg via INTRAMUSCULAR
  Filled 2015-12-25: qty 1

## 2015-12-25 MED ORDER — LORAZEPAM 1 MG PO TABS
ORAL_TABLET | ORAL | Status: AC
  Administered 2015-12-25: 1 mg via ORAL
  Filled 2015-12-25: qty 1

## 2015-12-25 MED ORDER — LORAZEPAM 2 MG/ML IJ SOLN: 1.0000 mg | Freq: Once | INTRAMUSCULAR | Status: DC

## 2015-12-25 MED ORDER — LORAZEPAM 2 MG/ML IJ SOLN
INTRAMUSCULAR | Status: AC
  Filled 2015-12-25: qty 1

## 2015-12-25 MED ORDER — LORAZEPAM 1 MG PO TABS
1.0000 mg | ORAL_TABLET | Freq: Once | ORAL | Status: AC
  Administered 2015-12-25: 1 mg via ORAL

## 2015-12-25 MED ORDER — LORAZEPAM 1 MG PO TABS: 1.0000 mg | ORAL_TABLET | Freq: Once | ORAL | Status: DC

## 2015-12-25 NOTE — ED Provider Notes (Addendum)
[facility] Emergency Department Provider Note   ____________________________________________  Time seen: Approximately 1:10 PM  I have reviewed the triage vital signs and the nursing notes.   HISTORY  Chief Complaint Hip Pain    HPI Tiffany Turner is a 75 y.o. female who is complaining of pain in her right posterior hip and mid buttock as well as her right lower back. Patient states that she injured it getting on her son's truck yesterday. Patient states the pain was much worse yesterday but is actually eased up today. Patient has been able to walk as normal with using a walker though she is having some pain but again she states is much better today. Patient denies any numbness tingling or weakness to her extremities. She denies any other injuries. Patient states her pain on scale of 0-10 is about a 2.   Past Medical History  Diagnosis Date  . Hypertension   . Hyperlipemia   . Coronary artery disease     stents placed  . Hip dislocation, right (HCC)     hx of dislocation    Patient Active Problem List   Diagnosis Date Noted  . 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 Dementia, Alzheimer's, with behavior disturbance 02/16/2015  . Hypertension 02/16/2015    Past Surgical History  Procedure Laterality Date  . Cardiac surgery      stents  . Coronary angioplasty with stent placement      Current Outpatient Rx  Name  Route  Sig  Dispense  Refill  . risperiDONE (RISPERDAL) 0.5 MG tablet   Oral   Take 1 tablet (0.5 mg total) by mouth at bedtime. Patient not taking: Reported on 10/04/2015   30 tablet   0     Allergies Penicillins and Sulfa antibiotics  History reviewed. No pertinent family history.  Social History Social History  Substance Use Topics  . Smoking status: Current Every Day Smoker -- 0.50 packs/day    Types: Cigarettes  . Smokeless tobacco: None  . Alcohol Use: No     Review of Systems Constitutional: No fever/chills Eyes: No visual changes. ENT: No sore throat. Cardiovascular: Denies chest pain. Respiratory: Denies shortness of breath. Gastrointestinal: No abdominal pain.  No nausea, no vomiting.  No diarrhea.  No constipation. Genitourinary: Negative for dysuria. Musculoskeletal:Positive for pain in her right posterior mid buttock and right hip and right lower back.. Skin: Negative for rash. Neurological: Negative for headaches, focal weakness or numbness.  10-point ROS otherwise negative.  ____________________________________________   PHYSICAL EXAM:  VITAL SIGNS: ED Triage Vitals  Enc Vitals Group     BP --      Pulse --      Resp --      Temp --      Temp src --      SpO2 --      Weight --      Height --      Head Cir --      Peak Flow --      Pain Score --      Pain Loc --      Pain Edu? --      Excl. in GC? --     Constitutional: Alert and oriented. Well appearing and in no acute distress. Eyes: Conjunctivae are normal. PERRL. EOMI. Head: Atraumatic. Nose: No congestion/rhinnorhea. Mouth/Throat: Mucous membranes are moist.  Oropharynx non-erythematous. Neck: No stridor.   Cardiovascular: Normal rate, regular  rhythm. Grossly normal heart sounds.  Good peripheral circulation. Respiratory: Normal respiratory effort.  No retractions. Lungs CTAB. Gastrointestinal: Soft and nontender. No distention. No abdominal bruits. No CVA tenderness. Musculoskeletal: No lower extremity  edema.  No joint effusions.Patient with minimal tenderness to her right paralumbar muscle area into her right mid buttock there is no evidence of any significant swelling, redness. There is a negative straight leg raise bilaterally. Neurologic:  Normal speech and language. No gross focal neurologic deficits are appreciated. No gait instability. Skin:  Skin is warm, dry and intact. No rash noted. Psychiatric: Mood and affect are normal. Speech and  behavior are normal.  ____________________________________________   LABS (all labs ordered are listed, but only abnormal results are displayed)  Labs Reviewed  CBC  COMPREHENSIVE METABOLIC PANEL  ETHANOL  URINALYSIS COMPLETEWITH MICROSCOPIC ([facility] ONLY)  URINE DRUG SCREEN, QUALITATIVE ([facility] ONLY)   ____________________________________________  EKG   ____________________________________________  RADIOLOGY  Dg Lumbar Spine 2-3 Views  12/25/2015  CLINICAL DATA:  Right hip pain. EXAM: LUMBAR SPINE - 2-3 VIEW COMPARISON:  None. FINDINGS: There is no evidence of lumbar spine fracture. Alignment is normal. Mild multilevel osteoarthritic changes of the lumbosacral spine. Generalized osteopenia. Heavy atherosclerotic aortic calcifications. Postsurgical clips in the abdomen. IMPRESSION: No evidence of fracture sacral spine. Generalized osteopenia. 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 Electronically Signed   By: Ted Mcalpine M.D.   On: 12/25/2015 14:37   Dg Hip Unilat With Pelvis 2-3 Views Right  12/25/2015  CLINICAL DATA:  Right hip pain. EXAM: DG HIP (WITH OR WITHOUT PELVIS) 2-3V RIGHT COMPARISON:  Fluoroscopic examination 11/02/2014 FINDINGS: There has been a prior total right hip arthroplasty with screwed in acetabular component and long stem femoral component with normal alignment of the orthopedic hardware and no evidence of fracture. There is a mild lucency about the tip of the femoral component. There is generalized osteopenia. Mild osteoarthritic changes are seen associated with the left hip. Vascular calcifications are noted. Soft tissues are otherwise grossly normal. IMPRESSION: No evidence of fracture. Status post right hip total arthroplasty with mild lucency around the tip of the femoral component, which may be a normal variant or represent mild loosening. Generalized osteopenia. Electronically Signed   By: Ted Mcalpine M.D.   On: 12/25/2015 14:35     ____________________________________________   PROCEDURES  Procedure(s) performed: None  Critical Care performed: No  ____________________________________________   INITIAL IMPRESSION / ASSESSMENT AND PLAN / ED COURSE  Pertinent labs & imaging results that were available during my care of the patient were reviewed by me and considered in my medical decision making (see chart for details).  3:36 PM Patient x-ray of her lower back and right hip at this time.  3:36 PM Patient's x-rays of her hip and lower back were negative. DSS took out IVC papers on this patient secondary to the fact that she was found walking around in the neighborhood has not been taking her medications and people and inthe neighborhood have been having to carry her back to her residence in which she lives alone. Apparently patient has been showing signs of dementia with behavioral disturbance. We will consult TTS/psychiatry for further evaluation of this IVC. We'll send routine labs and EtOH and UDS. Patient will be signed out to Dr. Sharma Covert at 4 PM. Patient x-ray of her hip showed a questionable lucency therefore we decided to get a CT of her right hip to make sure there is nothing acute.  The clinical social worker  has called and is working on her case for placement in a facility, and wants patient to have a PPD. ____________________________________________   FINAL CLINICAL IMPRESSION(S) / ED DIAGNOSES  Final diagnoses:  Hip pain, right  Altered mental status, unspecified altered mental status type  Dementia, with behavioral disturbance      NEW MEDICATIONS STARTED DURING THIS VISIT:  New Prescriptions   No medications on file     Note:  This document was prepared using Dragon voice recognition software and may include unintentional dictation errors.     Leona CarryLinda M Raiyan Dalesandro, MD 12/25/15 1536  Leona CarryLinda M Mat Stuard, MD 12/25/15 1606  Leona CarryLinda M Keaden Gunnoe, MD 12/25/15 [phone]

## 2015-12-25 NOTE — Clinical Social Work Note (Signed)
Clinical Social Work Assessment  Patient Details  Name: Tiffany Turner MRN: [id number] Date of Birth: 12/30/1940  Date of referral:  12/25/15               Reason for consult:  Facility Placement                Permission sought to share information with:  Guardian Permission granted to share information::  Yes, Verbal Permission Granted  Name::     Anselmo RodCraig Clay, [phone]  Agency::  [facility]  Relationship::  Guardian   Housing/Transportation Living arrangements for the past 2 months:  Single Family Home Source of Information:  Guardian Patient Interpreter Needed:  None Criminal Activity/Legal Involvement Pertinent to Current Situation/Hospitalization:  No - Comment as needed Significant Relationships:  Adult Children Lives with:  Self Do you feel safe going back to the place where you live?  No Need for family participation in patient care:  Yes (Comment)  Care giving concerns:   [facility] DSS has just been granted guardianship today over pt.   Social Worker assessment / plan:  CSW received a phone call from [place]Janice Worth with [facility] regarding pt regarding possible placement for pt. Ms. Wyn QuakerWorth provided information for [facility] guardian worker, Anselmo RodCraig Clay. Pt is also followed by Max Fickleina Reese, with Adult Protective Services (also [facility]). DSS just obtained guardianship today and pt needs placement. Ms. Wyn QuakerWorth will need to do an assessment prior to accepting a pt. However, there is available in the memory care unit. CSW spoke with Mr. Mat CarneClay and explained the process of placement of a pt to an ALF from the ED. Mr. Mat CarneClay is agreeable. CSW spoke with MD and requested a PPD, which is needed for placement. CSW will also complete FL2 and PASARR. CSW will continue to follow.   Employment status:  Retired Health and safety inspectornsurance information:  Medicare PT Recommendations:  Not assessed at this time Information / Referral to community resources:  [facility] (Comment Required:  [place], Name & Number of worker spoken with) (ALF)  Patient/Family's Response to care:  DSS was appreciative of CSW intervention.   Patient/Family's Understanding of and Emotional Response to Diagnosis, Current Treatment, and Prognosis:  DSS would like placement for pt as she is not safe at home.   Emotional Assessment Appearance:  Other (Comment Required Attitude/Demeanor/Rapport:  Unable to Assess Affect (typically observed):  Unable to Assess Orientation:  Oriented to Self Alcohol / Substance use:  Never Used Psych involvement (Current and /or in the community):  No (Comment)  Discharge Needs  Concerns to be addressed:  Cognitive Concerns Readmission within the last 30 days:  No Current discharge risk:  Chronically ill Barriers to Discharge:  Other (pending PPD and assessment from Memory Care unit)   Dede QuerySarah Kazmir Oki, LCSW 12/25/2015, 4:48 PM

## 2015-12-25 NOTE — ED Provider Notes (Signed)
75 y.o. female who came in for hip pain and has been cleared from that perspective, but who also is no longer safe to live at home. She does exhibit Evidence of paranoia and dementia. She's been evaluated by psychiatry who feel the best option for her is placement in a [facility]. I was called to the bedside because the patient was becoming belligerent stating that she wanted to leave. She became anxious and agitated and required police to be present in order to sit down and remain in her room. The patient has been given oral Ativan and a 2.5 mg intramuscular shot of Haldol at this time because I am concerned about her behavior, safety to herself and others.  Rockne MenghiniAnne-Caroline Cheyane Ayon, MD 12/25/15 1754

## 2015-12-25 NOTE — ED Notes (Signed)
Pt refusing to have urine sample collected

## 2015-12-25 NOTE — ED Notes (Signed)
DSS rep Tiffany Turner states pt was IVC because pt is not taking her medication, wandering down the road in the rain, states she barricaded herself in her house and would not come to court today, states they are seeking placement for pt

## 2015-12-25 NOTE — ED Notes (Signed)
Pt refusing to talk to TTS, states " I'm here for my hip not for any mental problems"  States she would like to know who brought her here

## 2015-12-25 NOTE — ED Notes (Signed)
Pt arrives via EMS from home, EMS states DSS has pt guardenship, states she needs to be evaluated for right hip pain, pt arrives under IVC paperwork, sheriffs dept is unable to clarify why pt is IVC, at present pt complains of right hip pain but states she rode in her sons truck yesterday which is high up, denies SI or HI, hx of dementia

## 2015-12-25 NOTE — ED Notes (Signed)
Attempted to call TTS, unable to reach

## 2015-12-25 NOTE — ED Notes (Signed)
Pt seen getting dressed in room, states she wants to go home now and can sign herself out. This RN and ODS explained to pt that she was under IVC and the [employer] brought the pt in. Pt became visually upset and belligerent with staff. Pt states " i know my rights and can leave this hospital if i want to" EDP at bedside to talk to pt.  Pt redirected to seat, pt refusing to stay in bed. Pt agreed to take 1mg  Ativan PO and received 2.5 mg haldol

## 2015-12-25 NOTE — Consult Note (Signed)
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 Hypertension [I10] 02/16/2015    Total Time spent with patient: 45 minutes  Subjective:   Tiffany Turner is a 75 y.o. female patient admitted with "my hip hurts".  HPI:  75 year old woman brought to the hospital under involuntary commitment filed by the Department of Social Services. Commitment reports that she has been wandering away from her home and walking around in her neighborhood and has been noncompliant with medicine. Patient states that she is here in the hospital entirely because of problems with her hip. She does know the police or sheriffs brought her into the hospital but refuses to acknowledge that there was a behavioral health component. Patient denies that she's been walking around her neighborhood saying that she never even leaves her house anymore. She denies any mood symptoms. Denies any hallucinations. She says that she is not on any psychiatric medicine but only takes medicine for her blood pressure. doesn't give me any other history right now. Denies any substance abuse denies suicidal thoughts.   Medical history: History of high blood pressure. Complaining of chronic hip pain.  Social history: Patient reports that she is still living at her home by herself. The Department of Social Services has sent along paperwork documenting that they are her legal  guardian.  Substance abuse history: Denies alcohol or drug abuse currently or in the past.  Past Psychiatric History: Patient has been seen more than once here at our hospital with delusional and agitated behavior that appears to be related to dementia. No clear past psychiatric history other than the dementia. She has been referred to geriatric hospitals on more than one occasion most recently this spring time. Not clear how long she has been out of the hospital. Patient denies that she is on any psychiatric medicine. No known history of actual suicide attempts more of confusion and paranoia.  Risk to Self: Is patient at risk for suicide?: No Risk to Others:   Prior Inpatient Therapy:   Prior Outpatient Therapy:    Past Medical History:  Past Medical History  Diagnosis Date  . Hypertension   . Hyperlipemia   . Coronary artery disease     stents placed  . Hip dislocation, right (HCC)     hx of dislocation    Past Surgical History  Procedure Laterality Date  . Cardiac surgery      stents  . Coronary angioplasty with stent placement     Family History: History reviewed. No pertinent family history. Family Psychiatric  History: Nonidentified. Patient denies any. Social History:  History  Alcohol Use No     History  Drug Use No    Social History   Social History  . Marital Status: Single    Spouse Name: N/A  . Number of Children: N/A  . Years of Education: N/A   Social History Main Topics  . Smoking status: Current Every Day Smoker -- 0.50 packs/day    Types: Cigarettes  . Smokeless tobacco: None  . Alcohol  Use: No  . Drug Use: No  . Sexual Activity: Not Asked   Other Topics Concern  . None   Social History Narrative   Additional Social History:    Allergies:   Allergies  Allergen Reactions  . Penicillins Other (See Comments)  . Sulfa Antibiotics Other (See Comments)    Labs:  Results for orders placed or performed during the hospital encounter of  12/25/15 (from the past 48 hour(s))  CBC     Status: Abnormal   Collection Time: 12/25/15  3:37 PM  Result Value Ref Range   WBC 9.1 3.6 - 11.0 K/uL   RBC 3.45 (L) 3.80 - 5.20 MIL/uL   Hemoglobin 11.6 (L) 12.0 - 16.0 g/dL   HCT 33.4 (L) 35.0 - 47.0 %   MCV 96.7 80.0 - 100.0 fL   MCH 33.5 26.0 - 34.0 pg   MCHC 34.7 32.0 - 36.0 g/dL   RDW 13.0 11.5 - 14.5 %   Platelets 260 150 - 440 K/uL  Comprehensive metabolic panel     Status: Abnormal   Collection Time: 12/25/15  3:37 PM  Result Value Ref Range   Sodium 141 135 - 145 mmol/L   Potassium 3.2 (L) 3.5 - 5.1 mmol/L   Chloride 108 101 - 111 mmol/L   CO2 25 22 - 32 mmol/L   Glucose, Bld 102 (H) 65 - 99 mg/dL   BUN 14 6 - 20 mg/dL   Creatinine, Ser 1.07 (H) 0.44 - 1.00 mg/dL   Calcium 8.6 (L) 8.9 - 10.3 mg/dL   Total Protein 6.5 6.5 - 8.1 g/dL   Albumin 3.3 (L) 3.5 - 5.0 g/dL   AST 14 (L) 15 - 41 U/L   ALT 8 (L) 14 - 54 U/L   Alkaline Phosphatase 61 38 - 126 U/L   Total Bilirubin 0.8 0.3 - 1.2 mg/dL   GFR calc non Af Amer 49 (L) >60 mL/min   GFR calc Af Amer 57 (L) >60 mL/min    Comment: (NOTE) The eGFR has been calculated using the CKD EPI equation. This calculation has not been validated in all clinical situations. eGFR's persistently <60 mL/min signify possible Chronic Kidney Disease.    Anion gap 8 5 - 15  Ethanol     Status: None   Collection Time: 12/25/15  3:37 PM  Result Value Ref Range   Alcohol, Ethyl (B) <5 <5 mg/dL    Comment:        LOWEST DETECTABLE LIMIT FOR SERUM ALCOHOL IS 5 mg/dL FOR MEDICAL PURPOSES ONLY     Current Facility-Administered Medications  Medication Dose Route Frequency Provider Last Rate Last Dose  . haloperidol lactate (HALDOL) 5 MG/ML injection           . haloperidol lactate (HALDOL) injection 2.5 mg  2.5 mg Intramuscular Once Eula Listen, MD      . LORazepam (ATIVAN) 1 MG tablet           . LORazepam (ATIVAN) 2 MG/ML injection           . LORazepam (ATIVAN) injection 1 mg   1 mg Intramuscular Once Eula Listen, MD       Current Outpatient Prescriptions  Medication Sig Dispense Refill  . risperiDONE (RISPERDAL) 0.5 MG tablet Take 1 tablet (0.5 mg total) by mouth at bedtime. (Patient not taking: Reported on 10/04/2015) 30 tablet 0    Musculoskeletal: Strength & Muscle Tone: decreased Gait & Station: unsteady Patient leans: N/A  Psychiatric Specialty Exam: Physical  Exam  Nursing note and vitals reviewed. Constitutional: She appears well-developed. She has a sickly appearance.  HENT:  Head: Normocephalic and atraumatic.  Eyes: Conjunctivae are normal. Pupils are equal, round, and reactive to light.  Neck: Normal range of motion.  Cardiovascular: Normal heart sounds.   Respiratory: Effort normal.  GI: Soft.  Musculoskeletal: Normal range of motion.  Neurological: She is alert.  Skin: Skin is warm and dry.  Psychiatric: Her affect is blunt and inappropriate. Her speech is tangential. She is slowed. Thought content is paranoid. Cognition and memory are impaired. She expresses impulsivity. She exhibits abnormal recent memory.    Review of Systems  Constitutional: Negative.   HENT: Negative.   Eyes: Negative.   Respiratory: Negative.   Cardiovascular: Negative.   Gastrointestinal: Negative.   Musculoskeletal: Positive for joint pain.  Skin: Negative.   Neurological: Negative.   Psychiatric/Behavioral: Negative for depression, suicidal ideas, hallucinations, memory loss and substance abuse. The patient is not nervous/anxious and does not have insomnia.     Blood pressure 124/55, pulse 77, temperature 98.5 F (36.9 C), temperature source Oral, resp. rate 18, height '5\' 1"'  (1.549 m), weight 53.071 kg (117 lb), SpO2 98 %.Body mass index is 22.12 kg/(m^2).  General Appearance: Disheveled  Eye Contact:  Minimal  Speech:  Slow  Volume:  Decreased  Mood:  Euthymic  Affect:  Constricted  Thought Process:  Goal Directed  Orientation:  Negative   Thought Content:  Paranoid Ideation  Suicidal Thoughts:  No  Homicidal Thoughts:  No  Memory:  Immediate;   Fair Recent;   Poor Remote;   Fair  Judgement:  Impaired  Insight:  Lacking  Psychomotor Activity:  Decreased  Concentration:  Concentration: Poor  Recall:  Poor  Fund of Knowledge:  Fair  Language:  Fair  Akathisia:  No  Handed:  Right  AIMS (if indicated):     Assets:  Housing Social Support  ADL's:  Intact  Cognition:  Impaired,  Mild  Sleep:        Treatment Plan Summary: Plan 75 year old woman with a history of dementia with psychotic symptoms. Resistant to treatment. The current paperwork and commitment are based only on reports that she has been wondering around her neighborhood. The patient denies this. It is not completely clear in what way this is dangerous. Nevertheless she is under commitment papers and DSS is now her guardian. I am not clear whether they want [place] to refer her to a geriatric psychiatry hospital again or whether this is more of a intermediate step in to forcibly placing her. I have discussed this with TTS. We can begin the process of referral to geropsychiatric although it may be that this is more of a placement issue. Social work can intervene with social services tomorrow and see if we can make more sense of it. Meanwhile her hip is being worked up by the ER doctor.  Disposition: Supportive therapy provided about ongoing stressors.  Alethia Berthold, MD 12/25/2015 5:51 PM

## 2015-12-25 NOTE — ED Notes (Signed)
Per DSS, Pt can receive chest x-ray to rule out TB

## 2015-12-26 LAB — URINE DRUG SCREEN, QUALITATIVE (ARMC ONLY)
Amphetamines, Ur Screen: NOT DETECTED
BARBITURATES, UR SCREEN: NOT DETECTED
BENZODIAZEPINE, UR SCRN: NOT DETECTED
Cannabinoid 50 Ng, Ur ~~LOC~~: NOT DETECTED
Cocaine Metabolite,Ur ~~LOC~~: NOT DETECTED
MDMA (Ecstasy)Ur Screen: NOT DETECTED
METHADONE SCREEN, URINE: NOT DETECTED
OPIATE, UR SCREEN: NOT DETECTED
Phencyclidine (PCP) Ur S: NOT DETECTED
TRICYCLIC, UR SCREEN: NOT DETECTED

## 2015-12-26 LAB — URINALYSIS COMPLETE WITH MICROSCOPIC (ARMC ONLY)
Bilirubin Urine: NEGATIVE
Glucose, UA: NEGATIVE mg/dL
Ketones, ur: NEGATIVE mg/dL
Nitrite: NEGATIVE
PH: 6 (ref 5.0–8.0)
PROTEIN: NEGATIVE mg/dL
Specific Gravity, Urine: 1.012 (ref 1.005–1.030)

## 2015-12-26 MED ORDER — RISPERIDONE 0.5 MG PO TBDP
0.5000 mg | ORAL_TABLET | Freq: Two times a day (BID) | ORAL | Status: DC
  Administered 2015-12-26 – 2015-12-27 (×3): 0.5 mg via ORAL
  Filled 2015-12-26 (×3): qty 1

## 2015-12-26 NOTE — Consult Note (Signed)
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 Hypertension [I10] 02/16/2015    Total Time spent with patient: 45 minutes  Subjective:   Tiffany Turner is a 75 y.o. female patient admitted with "my hip hurts".  Patient with no new complaint. Continues to have some complaints of hip pain. Despite this has been able to ambulate with some assistance. Denies any suicidal ideation. Has not been aggressive. Has generally been cooperative. Vital signs are all stable. We are awaiting referral to geriatric unit. [facility] may be willing to accept the patient. I have restarted her Risperdal half a milligram twice a day.  HPI:  75 year old woman brought to the hospital under involuntary commitment filed by the Department of Social Services. Commitment reports that she has been wandering away from her home and walking around in her neighborhood and has been noncompliant with medicine. Patient states that she is here in the hospital entirely because of problems with her hip. She does know the police or sheriffs brought her into the hospital but refuses to acknowledge that there was a behavioral health component. Patient denies that she's been walking around her neighborhood saying that she never even leaves her house anymore. She denies any mood symptoms. Denies any hallucinations. She says that she is not on any psychiatric medicine but only takes  medicine for her blood pressure. doesn't give me any other history right now. Denies any substance abuse denies suicidal thoughts.   Medical history: History of high blood pressure. Complaining of chronic hip pain.  Social history: Patient reports that she is still living at her home by herself. The Department of Social Services has sent along paperwork documenting that they are her legal guardian.  Substance abuse history: Denies alcohol or drug abuse currently or in the past.  Past Psychiatric History: Patient has been seen more than once here at our hospital with delusional and agitated behavior that appears to be related to dementia. No clear past psychiatric history other than the dementia. She has been referred to geriatric hospitals on more than one occasion most recently this spring time. Not clear how long she has been out of the hospital. Patient denies that she is on any psychiatric medicine. No known history of actual suicide attempts more of confusion and paranoia.  Risk to Self: Is patient at risk for suicide?: No Risk to Others:   Prior Inpatient Therapy:   Prior Outpatient Therapy:    Past Medical History:  Past Medical History  Diagnosis Date  . Hypertension   . Hyperlipemia   . Coronary artery disease     stents placed  . Hip dislocation, right (HCC)     hx of dislocation    Past Surgical History  Procedure Laterality Date  . Cardiac surgery      stents  . Coronary angioplasty with stent placement     Family History: History reviewed. No pertinent family history. Family Psychiatric  History: Nonidentified. Patient denies any. Social History:  History  Alcohol Use No  History  Drug Use No    Social History   Social History  . Marital Status: Single    Spouse Name: N/A  . Number of Children: N/A  . Years of Education: N/A   Social History Main Topics  . Smoking status: Current Every Day Smoker -- 0.50 packs/day    Types: Cigarettes  . Smokeless  tobacco: None  . Alcohol Use: No  . Drug Use: No  . Sexual Activity: Not Asked   Other Topics Concern  . None   Social History Narrative   Additional Social History:    Allergies:   Allergies  Allergen Reactions  . Penicillins Other (See Comments)  . Sulfa Antibiotics Other (See Comments)    Labs:  Results for orders placed or performed during the hospital encounter of 12/25/15 (from the past 48 hour(s))  CBC     Status: Abnormal   Collection Time: 12/25/15  3:37 PM  Result Value Ref Range   WBC 9.1 3.6 - 11.0 K/uL   RBC 3.45 (L) 3.80 - 5.20 MIL/uL   Hemoglobin 11.6 (L) 12.0 - 16.0 g/dL   HCT 33.4 (L) 35.0 - 47.0 %   MCV 96.7 80.0 - 100.0 fL   MCH 33.5 26.0 - 34.0 pg   MCHC 34.7 32.0 - 36.0 g/dL   RDW 13.0 11.5 - 14.5 %   Platelets 260 150 - 440 K/uL  Comprehensive metabolic panel     Status: Abnormal   Collection Time: 12/25/15  3:37 PM  Result Value Ref Range   Sodium 141 135 - 145 mmol/L   Potassium 3.2 (L) 3.5 - 5.1 mmol/L   Chloride 108 101 - 111 mmol/L   CO2 25 22 - 32 mmol/L   Glucose, Bld 102 (H) 65 - 99 mg/dL   BUN 14 6 - 20 mg/dL   Creatinine, Ser 1.07 (H) 0.44 - 1.00 mg/dL   Calcium 8.6 (L) 8.9 - 10.3 mg/dL   Total Protein 6.5 6.5 - 8.1 g/dL   Albumin 3.3 (L) 3.5 - 5.0 g/dL   AST 14 (L) 15 - 41 U/L   ALT 8 (L) 14 - 54 U/L   Alkaline Phosphatase 61 38 - 126 U/L   Total Bilirubin 0.8 0.3 - 1.2 mg/dL   GFR calc non Af Amer 49 (L) >60 mL/min   GFR calc Af Amer 57 (L) >60 mL/min    Comment: (NOTE) The eGFR has been calculated using the CKD EPI equation. This calculation has not been validated in all clinical situations. eGFR's persistently <60 mL/min signify possible Chronic Kidney Disease.    Anion gap 8 5 - 15  Ethanol     Status: None   Collection Time: 12/25/15  3:37 PM  Result Value Ref Range   Alcohol, Ethyl (B) <5 <5 mg/dL    Comment:        LOWEST DETECTABLE LIMIT FOR SERUM ALCOHOL IS 5 mg/dL FOR MEDICAL PURPOSES ONLY   Urinalysis  complete, with microscopic ([facility] only)     Status: Abnormal   Collection Time: 12/26/15  1:21 AM  Result Value Ref Range   Color, Urine YELLOW (A) YELLOW   APPearance CLOUDY (A) CLEAR   Glucose, UA NEGATIVE NEGATIVE mg/dL   Bilirubin Urine NEGATIVE NEGATIVE   Ketones, ur NEGATIVE NEGATIVE mg/dL   Specific Gravity, Urine 1.012 1.005 - 1.030   Hgb urine dipstick 3+ (A) NEGATIVE   pH 6.0 5.0 - 8.0   Protein, ur NEGATIVE NEGATIVE mg/dL   Nitrite NEGATIVE  NEGATIVE   Leukocytes, UA TRACE (A) NEGATIVE   RBC / HPF 6-30 0 - 5 RBC/hpf   WBC, UA TOO NUMEROUS TO COUNT 0 - 5 WBC/hpf   Bacteria, UA MANY (A) NONE SEEN   Squamous Epithelial / LPF 0-5 (A) NONE SEEN   Mucous PRESENT    Hyaline Casts, UA PRESENT   Urine Drug Screen, Qualitative ([facility] only)     Status: None   Collection Time: 12/26/15  1:21 AM  Result Value Ref Range   Tricyclic, Ur Screen NONE DETECTED NONE DETECTED   Amphetamines, Ur Screen NONE DETECTED NONE DETECTED   MDMA (Ecstasy)Ur Screen NONE DETECTED NONE DETECTED   Cocaine Metabolite,Ur [place] NONE DETECTED NONE DETECTED   Opiate, Ur Screen NONE DETECTED NONE DETECTED   Phencyclidine (PCP) Ur S NONE DETECTED NONE DETECTED   Cannabinoid 50 Ng, Ur [place] NONE DETECTED NONE DETECTED   Barbiturates, Ur Screen NONE DETECTED NONE DETECTED   Benzodiazepine, Ur Scrn NONE DETECTED NONE DETECTED   Methadone Scn, Ur NONE DETECTED NONE DETECTED    Comment: (NOTE) 850  Tricyclics, urine               Cutoff 1000 ng/mL 200  Amphetamines, urine             Cutoff 1000 ng/mL 300  MDMA (Ecstasy), urine           Cutoff 500 ng/mL 400  Cocaine Metabolite, urine       Cutoff 300 ng/mL 500  Opiate, urine                   Cutoff 300 ng/mL 600  Phencyclidine (PCP), urine      Cutoff 25 ng/mL 700  Cannabinoid, urine              Cutoff 50 ng/mL 800  Barbiturates, urine             Cutoff 200 ng/mL 900  Benzodiazepine, urine           Cutoff 200 ng/mL 1000 Methadone, urine                Cutoff 300  ng/mL 1100 1200 The urine drug screen provides only a preliminary, unconfirmed 1300 analytical test result and should not be used for non-medical 1400 purposes. Clinical consideration and professional judgment should 1500 be applied to any positive drug screen result due to possible 1600 interfering substances. A more specific alternate chemical method 1700 must be used in order to obtain a confirmed analytical result.  1800 Gas chromato graphy / mass spectrometry (GC/MS) is the preferred 1900 confirmatory method.     Current Facility-Administered Medications  Medication Dose Route Frequency Provider Last Rate Last Dose  . potassium chloride SA (K-DUR,KLOR-CON) CR tablet 40 mEq  40 mEq Oral Once Eula Listen, MD      . risperiDONE (RISPERDAL M-TABS) disintegrating tablet 0.5 mg  0.5 mg Oral BID Gonzella Lex, MD       Current Outpatient Prescriptions  Medication Sig Dispense Refill  . enalapril (VASOTEC) 20 MG tablet Take 20 mg by mouth at bedtime.     . rosuvastatin (CRESTOR) 40 MG tablet Take 40 mg by mouth at bedtime.       Musculoskeletal: Strength & Muscle Tone: decreased Gait & Station: unsteady Patient leans: N/A  Psychiatric Specialty Exam: Physical Exam  Nursing note and vitals reviewed. Constitutional: She appears well-developed. She has a sickly appearance.  HENT:  Head: Normocephalic and atraumatic.  Eyes: Conjunctivae are normal. Pupils are equal, round, and reactive to light.  Neck: Normal range of motion.  Cardiovascular: Normal heart sounds.   Respiratory: Effort normal.  GI: Soft.  Musculoskeletal: Normal range of motion.  Neurological: She is alert.  Skin: Skin is warm and dry.  Psychiatric: Her affect is blunt and inappropriate. Her speech is tangential. She is slowed. Thought content is paranoid. Cognition and memory are impaired. She expresses impulsivity. She exhibits abnormal recent memory.    Review of Systems  Constitutional: Negative.    HENT: Negative.   Eyes: Negative.   Respiratory: Negative.   Cardiovascular: Negative.   Gastrointestinal: Negative.   Musculoskeletal: Positive for joint pain.  Skin: Negative.   Neurological: Negative.   Psychiatric/Behavioral: Negative for depression, suicidal ideas, hallucinations, memory loss and substance abuse. The patient is not nervous/anxious and does not have insomnia.     Blood pressure 130/72, pulse 74, temperature 98.2 F (36.8 C), temperature source Oral, resp. rate 18, height '5\' 1"'  (1.549 m), weight 53.071 kg (117 lb), SpO2 97 %.Body mass index is 22.12 kg/(m^2).  General Appearance: Disheveled  Eye Contact:  Minimal  Speech:  Slow  Volume:  Decreased  Mood:  Euthymic  Affect:  Constricted  Thought Process:  Goal Directed  Orientation:  Negative  Thought Content:  Paranoid Ideation  Suicidal Thoughts:  No  Homicidal Thoughts:  No  Memory:  Immediate;   Fair Recent;   Poor Remote;   Fair  Judgement:  Impaired  Insight:  Lacking  Psychomotor Activity:  Decreased  Concentration:  Concentration: Poor  Recall:  Poor  Fund of Knowledge:  Fair  Language:  Fair  Akathisia:  No  Handed:  Right  AIMS (if indicated):     Assets:  Housing Social Support  ADL's:  Intact  Cognition:  Impaired,  Mild  Sleep:        Treatment Plan Summary: Plan Insight is poor. Slightly agitated. Not threatening. Confused. Restarted risperidone. Await possible referral to [facility] as soon as possible.  Disposition: Supportive therapy provided about ongoing stressors.  Alethia Berthold, MD 12/26/2015 4:53 PM

## 2015-12-26 NOTE — BH Specialist Note (Signed)
Patient declined at [facility] due to history of dementia.

## 2015-12-26 NOTE — BH Assessment (Signed)
Writer [facility] [phone]).  Corrie DandyMary is requesting the following items: EKG Chest X-Ray Urin and Drug Screen Updated Vitals  IVC paperwork  The nurse reports that she is going to fax this information to [facility] [phone])

## 2015-12-26 NOTE — ED Notes (Signed)
Pt ambulated to bathroom without difficulty, with cane.

## 2015-12-26 NOTE — BH Specialist Note (Signed)
Referral information for Geriatric Placement have been faxed to;    [facility] [phone]),    [facility] [phone]),    [facility] [phone]),    [facility] [phone]    [facility] [phone])

## 2015-12-26 NOTE — BH Specialist Note (Signed)
TTS [facility] with [name].  Ms. Tiffany Turner was declined due to facility reaching capacity.  [name] states that she will hold on to the referral and see about availability for the next day.

## 2015-12-26 NOTE — ED Notes (Signed)
Pt meeting with [facility] assisted [facility] here to evaluate patient.

## 2015-12-26 NOTE — ED Notes (Signed)
Vitals, Chest xray results, EKG, IVC papers, UDS and UA results faced to [facility] [place] (fax: [phone]).

## 2015-12-26 NOTE — ED Notes (Signed)
Pt assisted to BR to void; urine sample collected and sent to lab for UA and UDS.

## 2015-12-26 NOTE — Progress Notes (Signed)
Tiffany Turner at [place] called to say pt's referral was received and will be reviewed. Unknown if they will have female beds today- will call back.

## 2015-12-27 ENCOUNTER — Other Ambulatory Visit: Payer: Self-pay

## 2015-12-27 DIAGNOSIS — M25551 Pain in right hip: Secondary | ICD-10-CM | POA: Diagnosis not present

## 2015-12-27 DIAGNOSIS — I10 Essential (primary) hypertension: Secondary | ICD-10-CM | POA: Diagnosis present

## 2015-12-27 DIAGNOSIS — G308 Other Alzheimer's disease: Secondary | ICD-10-CM | POA: Diagnosis not present

## 2015-12-27 DIAGNOSIS — F0391 Unspecified dementia with behavioral disturbance: Secondary | ICD-10-CM | POA: Diagnosis not present

## 2015-12-27 DIAGNOSIS — E7800 Pure hypercholesterolemia, unspecified: Secondary | ICD-10-CM | POA: Diagnosis not present

## 2015-12-27 DIAGNOSIS — Z880 Allergy status to penicillin: Secondary | ICD-10-CM | POA: Diagnosis not present

## 2015-12-27 DIAGNOSIS — N390 Urinary tract infection, site not specified: Secondary | ICD-10-CM | POA: Diagnosis not present

## 2015-12-27 DIAGNOSIS — Z7982 Long term (current) use of aspirin: Secondary | ICD-10-CM | POA: Diagnosis not present

## 2015-12-27 DIAGNOSIS — Z79899 Other long term (current) drug therapy: Secondary | ICD-10-CM | POA: Diagnosis not present

## 2015-12-27 DIAGNOSIS — F0281 Dementia in other diseases classified elsewhere with behavioral disturbance: Secondary | ICD-10-CM | POA: Diagnosis not present

## 2015-12-27 DIAGNOSIS — F918 Other conduct disorders: Secondary | ICD-10-CM | POA: Diagnosis present

## 2015-12-27 DIAGNOSIS — F17210 Nicotine dependence, cigarettes, uncomplicated: Secondary | ICD-10-CM | POA: Diagnosis present

## 2015-12-27 DIAGNOSIS — M5030 Other cervical disc degeneration, unspecified cervical region: Secondary | ICD-10-CM | POA: Diagnosis present

## 2015-12-27 DIAGNOSIS — H02105 Unspecified ectropion of left lower eyelid: Secondary | ICD-10-CM | POA: Diagnosis not present

## 2015-12-27 DIAGNOSIS — E785 Hyperlipidemia, unspecified: Secondary | ICD-10-CM | POA: Diagnosis present

## 2015-12-27 DIAGNOSIS — G8929 Other chronic pain: Secondary | ICD-10-CM | POA: Diagnosis present

## 2015-12-27 DIAGNOSIS — Z955 Presence of coronary angioplasty implant and graft: Secondary | ICD-10-CM | POA: Diagnosis not present

## 2015-12-27 DIAGNOSIS — Z9183 Wandering in diseases classified elsewhere: Secondary | ICD-10-CM | POA: Diagnosis not present

## 2015-12-27 DIAGNOSIS — N3001 Acute cystitis with hematuria: Secondary | ICD-10-CM | POA: Diagnosis not present

## 2015-12-27 DIAGNOSIS — Z882 Allergy status to sulfonamides status: Secondary | ICD-10-CM | POA: Diagnosis not present

## 2015-12-27 DIAGNOSIS — M25559 Pain in unspecified hip: Secondary | ICD-10-CM | POA: Diagnosis present

## 2015-12-27 DIAGNOSIS — Z87828 Personal history of other (healed) physical injury and trauma: Secondary | ICD-10-CM | POA: Diagnosis not present

## 2015-12-27 DIAGNOSIS — J449 Chronic obstructive pulmonary disease, unspecified: Secondary | ICD-10-CM | POA: Diagnosis not present

## 2015-12-27 DIAGNOSIS — Z7951 Long term (current) use of inhaled steroids: Secondary | ICD-10-CM | POA: Diagnosis not present

## 2015-12-27 DIAGNOSIS — Z9861 Coronary angioplasty status: Secondary | ICD-10-CM | POA: Diagnosis not present

## 2015-12-27 DIAGNOSIS — I252 Old myocardial infarction: Secondary | ICD-10-CM | POA: Diagnosis not present

## 2015-12-27 DIAGNOSIS — G300 Alzheimer's disease with early onset: Secondary | ICD-10-CM | POA: Diagnosis not present

## 2015-12-27 DIAGNOSIS — I2510 Atherosclerotic heart disease of native coronary artery without angina pectoris: Secondary | ICD-10-CM | POA: Diagnosis present

## 2015-12-27 MED ORDER — CIPROFLOXACIN HCL 500 MG PO TABS
500.0000 mg | ORAL_TABLET | Freq: Once | ORAL | Status: AC
  Administered 2015-12-27: 500 mg via ORAL
  Filled 2015-12-27: qty 1

## 2015-12-27 NOTE — Clinical Social Work Note (Signed)
CSW spoke with Max Fickleina Reese with [facility] [place] [facility] and Ms. Worth has able to accept pt at [facility] in the Memory Care Unit on [address]. CSW will complete FL2/PASARR and verify PPD. CSW also will address payment with DSS. CSW will continue to follow.   Dede QuerySarah Everado Pillsbury, MSW, LCSW  Clinical Social Worker  [phone]

## 2015-12-27 NOTE — ED Notes (Signed)
Received report from Denia RN 

## 2015-12-27 NOTE — ED Provider Notes (Signed)
-----------------------------------------   7:24 AM on 12/27/2015 -----------------------------------------   Blood pressure 148/81, pulse 78, temperature 99.2 F (37.3 C), temperature source Oral, resp. rate 18, height 5\' 1"  (1.549 m), weight 117 lb (53.071 kg), SpO2 99 %.  The patient had no acute events since last update.  Calm and cooperative at this time.  Disposition is pending per Psychiatry/Behavioral Medicine team recommendations.     Jennye MoccasinBrian S Buren Havey, MD 12/27/15 [phone]

## 2015-12-27 NOTE — Clinical Social Work Note (Signed)
[facility] will be [facility] to [facility]. CSW updated [facility] [facility] and Guardian. CSW will continue to follow.   Dede QuerySarah Kyler Germer, MSW, LCSW  Clincial Social Worker  [phone]

## 2015-12-27 NOTE — ED Provider Notes (Signed)
-----------------------------------------   6:44 PM on 12/27/2015 -----------------------------------------  Patient was medically cleared prior to arrival however does appear that she does have a urinary tract infection which we will treat she is penicillin allergic and sulfa allergic, and it is unclear what this reaction may be, and she cannot definitively tell me. Therefore, I will give her Cipro with some reluctance given her age while we obtain a urine culture. Patient has been accepted and we will send her. No evidence of urosepsis.  Jeanmarie PlantJames A Keilani Terrance, MD 12/27/15 [phone]

## 2015-12-27 NOTE — Consult Note (Signed)
  Psychiatry: Patient seen briefly. She has no new complaints. Remains confused with poor insight and no understanding of her current situation. Vital signs are all stable. No sign of new physical problems. She has been accepted to [facility] and we are awaiting transfer. Continue IVC.

## 2015-12-27 NOTE — ED Notes (Signed)
Pt ambulated to bathroom, without difficulty, with her cane

## 2015-12-27 NOTE — Progress Notes (Signed)
TTS [facility]ed Chest x[facility] and [facility] requested for placement.   12/27/2015 Cheryl FlashNicole Nicolae Vasek, MS, NCC, LPCA Therapeutic Triage Specialist]

## 2015-12-27 NOTE — Progress Notes (Addendum)
Patient has been accepted to [facility].  Accepting physician is Dr. Merilyn BabaUreh [place]   Call report to [phone] Representative was Delorise ShinerGrace.  ER Staff is aware of it Carlisle Beers([facility].; ER MD & NelliePatient's Nurse)    12/27/2015 Cheryl FlashNicole Orian Figueira, MS, NCC, LPCA Therapeutic Triage Specialist

## 2015-12-27 NOTE — ED Notes (Signed)
Called for transport at [phone]

## 2015-12-31 LAB — URINE CULTURE

## 2016-01-01 ENCOUNTER — Emergency Department
Admission: EM | Admit: 2016-01-01 | Discharge: 2016-01-07 | Disposition: A | Discharge status: Home / Self Care | Payer: Medicare Other | Attending: Emergency Medicine | Admitting: Emergency Medicine

## 2016-01-01 ENCOUNTER — Telehealth: Payer: Self-pay | Admitting: Pharmacist

## 2016-01-01 ENCOUNTER — Encounter: Payer: Self-pay | Admitting: Emergency Medicine

## 2016-01-01 DIAGNOSIS — I2510 Atherosclerotic heart disease of native coronary artery without angina pectoris: Secondary | ICD-10-CM | POA: Insufficient documentation

## 2016-01-01 DIAGNOSIS — G300 Alzheimer's disease with early onset: Secondary | ICD-10-CM

## 2016-01-01 DIAGNOSIS — Z7951 Long term (current) use of inhaled steroids: Secondary | ICD-10-CM | POA: Diagnosis not present

## 2016-01-01 DIAGNOSIS — Z9861 Coronary angioplasty status: Secondary | ICD-10-CM | POA: Insufficient documentation

## 2016-01-01 DIAGNOSIS — G308 Other Alzheimer's disease: Secondary | ICD-10-CM | POA: Diagnosis not present

## 2016-01-01 DIAGNOSIS — I10 Essential (primary) hypertension: Secondary | ICD-10-CM | POA: Diagnosis present

## 2016-01-01 DIAGNOSIS — F0281 Dementia in other diseases classified elsewhere with behavioral disturbance: Secondary | ICD-10-CM | POA: Diagnosis present

## 2016-01-01 DIAGNOSIS — F02818 Dementia in other diseases classified elsewhere, unspecified severity, with other behavioral disturbance: Secondary | ICD-10-CM | POA: Diagnosis present

## 2016-01-01 DIAGNOSIS — E785 Hyperlipidemia, unspecified: Secondary | ICD-10-CM | POA: Diagnosis not present

## 2016-01-01 DIAGNOSIS — Z7982 Long term (current) use of aspirin: Secondary | ICD-10-CM | POA: Insufficient documentation

## 2016-01-01 DIAGNOSIS — F17210 Nicotine dependence, cigarettes, uncomplicated: Secondary | ICD-10-CM | POA: Diagnosis not present

## 2016-01-01 DIAGNOSIS — Z79899 Other long term (current) drug therapy: Secondary | ICD-10-CM | POA: Insufficient documentation

## 2016-01-01 LAB — URINALYSIS COMPLETE WITH MICROSCOPIC (ARMC ONLY)
BACTERIA UA: NONE SEEN
Bilirubin Urine: NEGATIVE
Glucose, UA: NEGATIVE mg/dL
Ketones, ur: NEGATIVE mg/dL
LEUKOCYTES UA: NEGATIVE
Nitrite: NEGATIVE
PH: 7 (ref 5.0–8.0)
Protein, ur: NEGATIVE mg/dL
SPECIFIC GRAVITY, URINE: 1.006 (ref 1.005–1.030)
SQUAMOUS EPITHELIAL / LPF: NONE SEEN

## 2016-01-01 LAB — COMPREHENSIVE METABOLIC PANEL
ALBUMIN: 4 g/dL (ref 3.5–5.0)
ALT: 13 U/L — ABNORMAL LOW (ref 14–54)
ANION GAP: 8 (ref 5–15)
AST: 20 U/L (ref 15–41)
Alkaline Phosphatase: 70 U/L (ref 38–126)
BUN: 36 mg/dL — AB (ref 6–20)
CHLORIDE: 99 mmol/L — AB (ref 101–111)
CO2: 28 mmol/L (ref 22–32)
Calcium: 10.1 mg/dL (ref 8.9–10.3)
Creatinine, Ser: 1.33 mg/dL — ABNORMAL HIGH (ref 0.44–1.00)
GFR calc Af Amer: 44 mL/min — ABNORMAL LOW (ref >60)
GFR calc non Af Amer: 38 mL/min — ABNORMAL LOW (ref >60)
GLUCOSE: 109 mg/dL — AB (ref 65–99)
POTASSIUM: 5.2 mmol/L — AB (ref 3.5–5.1)
Sodium: 135 mmol/L (ref 135–145)
Total Bilirubin: 0.2 mg/dL — ABNORMAL LOW (ref 0.3–1.2)
Total Protein: 7.5 g/dL (ref 6.5–8.1)

## 2016-01-01 LAB — CBC
HEMATOCRIT: 35.7 % (ref 35.0–47.0)
HEMOGLOBIN: 12.3 g/dL (ref 12.0–16.0)
MCH: 33.6 pg (ref 26.0–34.0)
MCHC: 34.5 g/dL (ref 32.0–36.0)
MCV: 97.5 fL (ref 80.0–100.0)
Platelets: 397 10*3/uL (ref 150–440)
RBC: 3.66 MIL/uL — AB (ref 3.80–5.20)
RDW: 12.7 % (ref 11.5–14.5)
WBC: 8.2 10*3/uL (ref 3.6–11.0)

## 2016-01-01 LAB — SALICYLATE LEVEL: Salicylate Lvl: <4 mg/dL (ref 2.8–30.0)

## 2016-01-01 LAB — URINE DRUG SCREEN, QUALITATIVE (ARMC ONLY)
AMPHETAMINES, UR SCREEN: NOT DETECTED
Barbiturates, Ur Screen: NOT DETECTED
Benzodiazepine, Ur Scrn: NOT DETECTED
COCAINE METABOLITE, UR ~~LOC~~: NOT DETECTED
Cannabinoid 50 Ng, Ur ~~LOC~~: NOT DETECTED
MDMA (ECSTASY) UR SCREEN: NOT DETECTED
METHADONE SCREEN, URINE: NOT DETECTED
OPIATE, UR SCREEN: NOT DETECTED
Phencyclidine (PCP) Ur S: NOT DETECTED
Tricyclic, Ur Screen: NOT DETECTED

## 2016-01-01 LAB — ETHANOL

## 2016-01-01 LAB — ACETAMINOPHEN LEVEL

## 2016-01-01 MED ORDER — SODIUM CHLORIDE 0.9 % IV BOLUS (SEPSIS)
500.0000 mL | Freq: Once | INTRAVENOUS | Status: AC
  Administered 2016-01-01: 500 mL via INTRAVENOUS

## 2016-01-01 MED ORDER — ENALAPRIL MALEATE 10 MG PO TABS
20.0000 mg | ORAL_TABLET | Freq: Every day | ORAL | Status: DC
  Administered 2016-01-01 – 2016-01-06 (×5): 20 mg via ORAL
  Filled 2016-01-01 (×4): qty 2
  Filled 2016-01-01: qty 1
  Filled 2016-01-01 (×2): qty 2
  Filled 2016-01-01: qty 1

## 2016-01-01 MED ORDER — ROSUVASTATIN CALCIUM 20 MG PO TABS
40.0000 mg | ORAL_TABLET | Freq: Every day | ORAL | Status: DC
  Administered 2016-01-01 – 2016-01-06 (×6): 40 mg via ORAL
  Filled 2016-01-01 (×6): qty 2

## 2016-01-01 NOTE — ED Notes (Signed)
Pt ate dinner tray.  Pt calm and cooperative.

## 2016-01-01 NOTE — ED Notes (Signed)
Pt here from [facility]; pt seen here recently and IVC by DSS. DSS is pt's legal guardian. Pt d/c from [place] to [place], [place] [facility] [place] [facility] [facility]. Pt did not want to go to [facility], pt threatening staff with cane. Pt denies any SI or HI.

## 2016-01-01 NOTE — ED Notes (Signed)
Pt sleeping. 

## 2016-01-01 NOTE — Telephone Encounter (Signed)
Pt transferred to [facility] on 6/22. Called RN Delorise ShinerGrace at [phone]. She gave a fax # of [phone] to fax over urine cx report for pt. Cx report faxed at 1515 on 6/27  Olene FlossMelissa D Maccia, Vermont[place]arm.D Clinical Pharmacist

## 2016-01-01 NOTE — ED Notes (Signed)
Pts valuables were locked into safe via security.

## 2016-01-01 NOTE — ED Notes (Signed)
meds given.  Pt resting quietly.  Iv fluids infused.

## 2016-01-01 NOTE — ED Provider Notes (Signed)
[facility] Emergency Department Provider Note  ____________________________________________   I have reviewed the triage vital signs and the nursing notes.   HISTORY  Chief Complaint Aggressive Behavior    HPI Tiffany Turner is a 75 y.o. female who is brought here by police. Patient was recently discharged [facility] after being brought therefore IVC. After she was medically cleared [facility] and psychiatrically cleared, so she was taken back to her nursing facility where she began to call people the N word and threatened them with her cane so they brought her back here. Patient therefore is completing a circle from here to [place] past her nursing home back here she states she has no complaints at this time. She only is here because she does not like the environment of her nursing home. Patient is a ward of the state..      Past Medical History  Diagnosis Date  . Hypertension   . Hyperlipemia   . Coronary artery disease     stents placed  . Hip dislocation, right (HCC)     hx of dislocation    Patient Active Problem List   Diagnosis Date Noted  . 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 Dementia, Alzheimer's, with behavior disturbance 02/16/2015  . Hypertension 02/16/2015    Past Surgical History  Procedure Laterality Date  . Cardiac surgery      stents  . Coronary angioplasty with stent placement      Current Outpatient Rx  Name  Route  Sig  Dispense  Refill  . enalapril (VASOTEC) 20 MG tablet   Oral   Take 20 mg by mouth at bedtime.          . rosuvastatin (CRESTOR) 40 MG tablet   Oral   Take 40 mg by mouth at bedtime.            Allergies Penicillins and Sulfa antibiotics  No family history on file.  Social History Social History  Substance Use Topics  . Smoking status: Current Every Day Smoker -- 0.50 packs/day    Types: Cigarettes  . Smokeless tobacco: None   . Alcohol Use: No    Review of Systems Constitutional: No fever/chills Eyes: No visual changes. ENT: No sore throat. No stiff neck no neck pain Cardiovascular: Denies chest pain. Respiratory: Denies shortness of breath. Gastrointestinal:   no vomiting.  No diarrhea.  No constipation. Genitourinary: Negative for dysuria. Musculoskeletal: Negative lower extremity swelling Skin: Negative for rash. Neurological: Negative for headaches, focal weakness or numbness. 10-point ROS otherwise negative.  ____________________________________________   PHYSICAL EXAM:  VITAL SIGNS: ED Triage Vitals  Enc Vitals Group     BP 01/01/16 1746 124/69 mmHg     Pulse Rate 01/01/16 1746 77     Resp 01/01/16 1746 16     Temp 01/01/16 1746 97.6 F (36.4 C)     Temp Source 01/01/16 1746 Oral     SpO2 01/01/16 1746 100 %     Weight 01/01/16 1746 110 lb (49.896 kg)     Height 01/01/16 1746 5\' 1"  (1.549 m)     Head Cir --      Peak Flow --      Pain Score --      Pain Loc --      Pain Edu? --      Excl. in GC? --     Constitutional: Alert and orientedTo name and place and not quite sure  of the date exactly whatthe year at her baseline according to notes. Well appearing and in no acute distress. Eyes: Conjunctivae are normal. PERRL. EOMI. Head: Atraumatic. Nose: No congestion/rhinnorhea. Mouth/Throat: Mucous membranes are moist.  Oropharynx non-erythematous. Neck: No stridor.   Nontender with no meningismus Cardiovascular: Normal rate, regular rhythm. Grossly normal heart sounds.  Good peripheral circulation. Respiratory: Normal respiratory effort.  No retractions. Lungs CTAB. Abdominal: Soft and nontender. No distention. No guarding no rebound Back:  There is no focal tenderness or step off there is no midline tenderness there are no lesions noted. there is no CVA tenderness Musculoskeletal: No lower extremity tenderness. No joint effusions, no DVT signs strong distal pulses no  edema Neurologic:  Normal speech and language. No gross focal neurologic deficits are appreciated.  Skin:  Skin is warm, dry and intact. No rash noted. Psychiatric: Mood and affect are normal. Speech and behavior are normal.  ____________________________________________   LABS (all labs ordered are listed, but only abnormal results are displayed)  Labs Reviewed  COMPREHENSIVE METABOLIC PANEL - Abnormal; Notable for the following:    Potassium 5.2 (*)    Chloride 99 (*)    Glucose, Bld 109 (*)    BUN 36 (*)    Creatinine, Ser 1.33 (*)    ALT 13 (*)    Total Bilirubin 0.2 (*)    GFR calc non Af Amer 38 (*)    GFR calc Af Amer 44 (*)    All other components within normal limits  ACETAMINOPHEN LEVEL - Abnormal; Notable for the following:    Acetaminophen (Tylenol), Serum <10 (*)    All other components within normal limits  CBC - Abnormal; Notable for the following:    RBC 3.66 (*)    All other components within normal limits  ETHANOL  SALICYLATE LEVEL  URINE DRUG SCREEN, QUALITATIVE ([facility] ONLY)   ____________________________________________  EKG  I personally interpreted any EKGs ordered by me or triage  ____________________________________________  RADIOLOGY  I reviewed any imaging ordered by me or triage that were performed during my shift and, if possible, patient and/or family made aware of any abnormal findings. ____________________________________________   PROCEDURES  Procedure(s) performed: None  Critical Care performed: None  ____________________________________________   INITIAL IMPRESSION / ASSESSMENT AND PLAN / ED COURSE  Pertinent labs & imaging results that were available during my care of the patient were reviewed by me and considered in my medical decision making (see chart for details).  Patient here because she doesn't want to be at her nursing home essentially. I did not IVC her. This is her from place to be she is here voluntary, tomorrow,  we will have psychiatry talk to her as well as social work talk to DSS and we will come up with the plan. ____________________________________________   FINAL CLINICAL IMPRESSION(S) / ED DIAGNOSES  Final diagnoses:  None      This chart was dictated using voice recognition software.  Despite best efforts to proofread,  errors can occur which can change meaning.     Jeanmarie PlantJames A McShane, MD 01/01/16 [phone]

## 2016-01-02 DIAGNOSIS — F0281 Dementia in other diseases classified elsewhere with behavioral disturbance: Secondary | ICD-10-CM | POA: Diagnosis not present

## 2016-01-02 DIAGNOSIS — G300 Alzheimer's disease with early onset: Secondary | ICD-10-CM

## 2016-01-02 NOTE — ED Notes (Signed)
Pt sleeping. 

## 2016-01-02 NOTE — ED Notes (Signed)
Pt given breakfast tray. All items opened for pt. Pt eating.

## 2016-01-02 NOTE — Progress Notes (Signed)
Clinical Social Worker (CSW) received a call from [employer] patient's DSS guardian [phone]. Per guardian he does not know if [facility] will take patient and he is looking into [facility]. Per guardian patient will pay privately for ALF until Medicaid comes through. CSW contacted [facility] administrator Ephriam KnucklesChristian who reported that he is sending Melody his care manger to assess patient tonight to see if she is appropriate for admission. CS[facility] FL[facility]. CSW will continue to follow and assist as needed.   Jetta LoutBailey Morgan, LCSW [phone]

## 2016-01-02 NOTE — ED Notes (Signed)
Pt awake and eating breakfast. Pt states she wants to go home. She said she thought she was going home yesterday and that she was taken "somewhere else" instead and then brought here. Pt reports that she has never been to [facility] before and that she lives at home in [place]. She wants to go home.

## 2016-01-02 NOTE — ED Notes (Signed)
Pt up to bathroom with assistance 

## 2016-01-02 NOTE — ED Notes (Signed)
Tasia Catchingsraig from DSS is at bedside.

## 2016-01-02 NOTE — ED Notes (Signed)
Pt sleeping; environment secure. Will continue to monitor.

## 2016-01-02 NOTE — ED Notes (Signed)
Social work called to let us know they were in touch with APS and they are attempted to [facility]. Representative Shawna Orleans(Melanie) of [facility] will be rounding tonight.

## 2016-01-02 NOTE — ED Notes (Signed)
Social worker called to give an update.  Unable to get in touch with [facility]  And APS.  Will continues to try and find placement.

## 2016-01-02 NOTE — Consult Note (Signed)
[facility] Face-to-Face Psychiatry Consult   Reason for Consult:  Consult for this 75 year old woman with a history of dementia. She was returned to the hospital a few days after she was last released. 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 Hypertension [I10] 02/16/2015    Total Time spent with patient: 45 minutes  Subjective:   Tiffany Turner is a 75 y.o. female patient admitted with "I asked the police to bring me back here".  HPI:  Patient interviewed. Chart reviewed. Labs and vitals reviewed. This 75 year old woman was in our emergency room last week. She had been brought in under petition filed by the [facility] because the patient was wandering. DSS had filed guardianship and had themselves made the guardian of the patient but she would not stay put in placement. We were able to get her accepted at [facility] geriatric unit and she was transferred there on the 22nd. Sounds like she was just there a few days and then was discharged to go to [facility] assisted living. From what I can tell from what the patient is telling me she wouldn't even get out of the [facility] when they got to [facility]. She got agitated and insisted that the police bring her back here to the hospital because she said she was not going to stay at [facility]. She made it clear to me that she has some racial prejudices which she appears to feel quite comfortable with that are one of her reasons for not staying there but she also says that she just doesn't want to be around strangers. Patient is denying any  hallucinations. Denies suicidal or homicidal ideation. States that her mood is feeling fine. She has been able to take care of her basic ADLs here with only minimal assistance. Doesn't show signs of being delusional.  Past psychiatric history: Patient has some dementia. This is on top of what was probably a somewhat obstreperous personality in the first place. We have seen her a couple times for wandering behavior related to her dementia. No clear history of other psychiatric problem.  Medical history: Hypertension  Substance abuse history: Denies any  Past Psychiatric History: Patient has been seen previously and the only mental health problems identified were dementia which is mild at worst. She actually maintains fairly good hold on her short-term memory. Her judgment however is not very good. Minimal improvement with sedating medications. Doesn't really change her underlying refusal to go to assisted living.  Risk to Self: Is patient at risk for suicide?: No Risk to Others:   Prior Inpatient Therapy:   Prior Outpatient Therapy:    Past Medical History:  Past Medical History  Diagnosis Date  . Hypertension   . Hyperlipemia   . Coronary artery disease     stents placed  . Hip dislocation, right (HCC)     hx of dislocation    Past Surgical History  Procedure Laterality Date  . Cardiac surgery      stents  . Coronary angioplasty with stent placement     Family History: No family history on file. Family Psychiatric  History: Not clear that there is any relevant family history.  Social History:  History  Alcohol Use No     History  Drug Use No    Social History   Social History  . Marital Status: Single    Spouse Name: N/A  . Number of Children: N/A  . Years of Education: N/A   Social History Main Topics  . Smoking status: Current Every Day Smoker -- 0.50 packs/day    Types: Cigarettes  . Smokeless tobacco: None  . Alcohol Use: No  . Drug Use: No  . Sexual Activity:  Not Asked   Other Topics Concern  . None   Social History Narrative   Additional Social History:    Allergies:   Allergies  Allergen Reactions  . Penicillins Other (See Comments)  . Sulfa Antibiotics Other (See Comments)    Labs:  Results for orders placed or performed during the hospital encounter of 01/01/16 (from the past 48 hour(s))  Urine Drug Screen, Qualitative     Status: None   Collection Time: 01/01/16  5:38 PM  Result Value Ref Range   Tricyclic, Ur Screen NONE DETECTED NONE DETECTED   Amphetamines, Ur Screen NONE DETECTED NONE DETECTED   MDMA (Ecstasy)Ur Screen NONE DETECTED NONE DETECTED   Cocaine Metabolite,Ur [place] NONE DETECTED NONE DETECTED   Opiate, Ur Screen NONE DETECTED NONE DETECTED   Phencyclidine (PCP) Ur S NONE DETECTED NONE DETECTED   Cannabinoid 50 Ng, Ur [place] NONE DETECTED NONE DETECTED   Barbiturates, Ur Screen NONE DETECTED NONE DETECTED   Benzodiazepine, Ur Scrn NONE DETECTED NONE DETECTED   Methadone Scn, Ur NONE DETECTED NONE DETECTED    Comment: (NOTE) 161  Tricyclics, urine               Cutoff 1000 ng/mL 200  Amphetamines, urine             Cutoff 1000 ng/mL 300  MDMA (Ecstasy), urine           Cutoff 500 ng/mL 400  Cocaine Metabolite, urine       Cutoff 300 ng/mL 500  Opiate, urine                   Cutoff 300 ng/mL 600  Phencyclidine (PCP), urine      Cutoff 25 ng/mL 700  Cannabinoid, urine              Cutoff 50 ng/mL 800  Barbiturates, urine             Cutoff 200 ng/mL 900  Benzodiazepine, urine           Cutoff 200 ng/mL 1000 Methadone, urine                Cutoff 300 ng/mL 1100 1200 The urine drug screen provides only a preliminary, unconfirmed 1300 analytical test result and should not be used for non-medical 1400 purposes. Clinical consideration and professional judgment should 1500 be applied to any positive drug screen result due to possible 1600 interfering substances. A more specific alternate chemical method 1700 must be  used in order to obtain a confirmed analytical result.  1800 Gas chromato graphy / mass spectrometry (GC/MS) is the preferred 1900 confirmatory method.   Comprehensive metabolic panel     Status: Abnormal   Collection Time: 01/01/16  5:53 PM  Result Value Ref Range   Sodium 135 135 - 145 mmol/L   Potassium 5.2 (H) 3.5 - 5.1 mmol/L   Chloride 99 (L) 101 - 111 mmol/L   CO2 28 22 -  32 mmol/L   Glucose, Bld 109 (H) 65 - 99 mg/dL   BUN 36 (H) 6 - 20 mg/dL   Creatinine, Ser 1.33 (H) 0.44 - 1.00 mg/dL   Calcium 10.1 8.9 - 10.3 mg/dL   Total Protein 7.5 6.5 - 8.1 g/dL   Albumin 4.0 3.5 - 5.0 g/dL   AST 20 15 - 41 U/L   ALT 13 (L) 14 - 54 U/L   Alkaline Phosphatase 70 38 - 126 U/L   Total Bilirubin 0.2 (L) 0.3 - 1.2 mg/dL   GFR calc non Af Amer 38 (L) >60 mL/min   GFR calc Af Amer 44 (L) >60 mL/min    Comment: (NOTE) The eGFR has been calculated using the CKD EPI equation. This calculation has not been validated in all clinical situations. eGFR's persistently <60 mL/min signify possible Chronic Kidney Disease.    Anion gap 8 5 - 15  Ethanol     Status: None   Collection Time: 01/01/16  5:53 PM  Result Value Ref Range   Alcohol, Ethyl (B) <5 <5 mg/dL    Comment:        LOWEST DETECTABLE LIMIT FOR SERUM ALCOHOL IS 5 mg/dL FOR MEDICAL PURPOSES ONLY   Salicylate level     Status: None   Collection Time: 01/01/16  5:53 PM  Result Value Ref Range   Salicylate Lvl <8.1 2.8 - 30.0 mg/dL  Acetaminophen level     Status: Abnormal   Collection Time: 01/01/16  5:53 PM  Result Value Ref Range   Acetaminophen (Tylenol), Serum <10 (L) 10 - 30 ug/mL    Comment:        THERAPEUTIC CONCENTRATIONS VARY SIGNIFICANTLY. A RANGE OF 10-30 ug/mL MAY BE AN EFFECTIVE CONCENTRATION FOR MANY PATIENTS. HOWEVER, SOME ARE BEST TREATED AT CONCENTRATIONS OUTSIDE THIS RANGE. ACETAMINOPHEN CONCENTRATIONS >150 ug/mL AT 4 HOURS AFTER INGESTION AND >50 ug/mL AT 12 HOURS AFTER INGESTION ARE OFTEN  ASSOCIATED WITH TOXIC REACTIONS.   cbc     Status: Abnormal   Collection Time: 01/01/16  5:53 PM  Result Value Ref Range   WBC 8.2 3.6 - 11.0 K/uL   RBC 3.66 (L) 3.80 - 5.20 MIL/uL   Hemoglobin 12.3 12.0 - 16.0 g/dL   HCT 35.7 35.0 - 47.0 %   MCV 97.5 80.0 - 100.0 fL   MCH 33.6 26.0 - 34.0 pg   MCHC 34.5 32.0 - 36.0 g/dL   RDW 12.7 11.5 - 14.5 %   Platelets 397 150 - 440 K/uL  Urinalysis complete, with microscopic     Status: Abnormal   Collection Time: 01/01/16  7:39 PM  Result Value Ref Range   Color, Urine STRAW (A) YELLOW   APPearance CLEAR (A) CLEAR   Glucose, UA NEGATIVE NEGATIVE mg/dL   Bilirubin Urine NEGATIVE NEGATIVE   Ketones, ur NEGATIVE NEGATIVE mg/dL   Specific Gravity, Urine 1.006 1.005 - 1.030   Hgb urine dipstick 2+ (A) NEGATIVE   pH 7.0 5.0 - 8.0   Protein, ur NEGATIVE NEGATIVE mg/dL   Nitrite NEGATIVE NEGATIVE   Leukocytes, UA NEGATIVE NEGATIVE   RBC / HPF 0-5 0 - 5 RBC/hpf   WBC, UA 0-5 0 - 5 WBC/hpf   Bacteria, UA NONE SEEN NONE SEEN   Squamous Epithelial / LPF NONE SEEN NONE SEEN   Mucous PRESENT     Current Facility-Administered Medications  Medication Dose Route Frequency Provider Last Rate Last Dose  . enalapril (VASOTEC) tablet 20 mg  20 mg Oral QHS  Schuyler Amor, MD   20 mg at 01/01/16 2123  . rosuvastatin (CRESTOR) tablet 40 mg  40 mg Oral QHS Schuyler Amor, MD   40 mg at 01/01/16 2124   Current Outpatient Prescriptions  Medication Sig Dispense Refill  . Artificial Tear Ointment (Bodfish PETROL-MINERAL OIL-LANOLIN) 0.1-0.1 % OINT Place 1 drop into the right eye at bedtime.    Marland Kitchen aspirin EC 81 MG tablet Take 81 mg by mouth daily.    Marland Kitchen atorvastatin (LIPITOR) 40 MG tablet Take 40 mg by mouth daily.    . fluticasone (FLONASE) 50 MCG/ACT nasal spray Place 2 sprays into both nostrils daily.    . metoprolol succinate (TOPROL-XL) 50 MG 24 hr tablet Take 50 mg by mouth daily. Take with or immediately following a meal.    . risperiDONE (RISPERDAL) 0.5  MG tablet Take 0.5 mg by mouth every evening.    . traZODone (DESYREL) 50 MG tablet Take 1 tablet by mouth at bedtime.    . enalapril (VASOTEC) 20 MG tablet Take 20 mg by mouth at bedtime.     . rosuvastatin (CRESTOR) 40 MG tablet Take 40 mg by mouth at bedtime.       Musculoskeletal: Strength & Muscle Tone: decreased Gait & Station: unsteady Patient leans: N/A  Psychiatric Specialty Exam: Physical Exam  Nursing note and vitals reviewed. Constitutional: She appears well-developed and well-nourished.  HENT:  Head: Normocephalic and atraumatic.  Eyes: Conjunctivae are normal. Pupils are equal, round, and reactive to light.  Neck: Normal range of motion.  Cardiovascular: Normal heart sounds.   Respiratory: Effort normal. No respiratory distress.  GI: Soft.  Musculoskeletal: Normal range of motion.  Neurological: She is alert.  Skin: Skin is warm and dry.  Psychiatric: Her speech is normal and behavior is normal. Thought content normal. Her affect is blunt. She expresses impulsivity. She exhibits abnormal recent memory.    Review of Systems  Constitutional: Negative.   HENT: Negative.   Eyes: Negative.   Respiratory: Negative.   Cardiovascular: Negative.   Gastrointestinal: Negative.   Musculoskeletal: Negative.   Skin: Negative.   Neurological: Negative.   Psychiatric/Behavioral: Negative for depression, suicidal ideas, hallucinations, memory loss and substance abuse. The patient is not nervous/anxious and does not have insomnia.     Blood pressure 100/50, pulse 74, temperature 98.8 F (37.1 C), temperature source Oral, resp. rate 18, height '5\' 1"'  (1.549 m), weight 49.896 kg (110 lb), SpO2 98 %.Body mass index is 20.8 kg/(m^2).  General Appearance: Casual  Eye Contact:  Good  Speech:  Slow  Volume:  Decreased  Mood:  Euthymic  Affect:  Constricted  Thought Process:  Goal Directed  Orientation:  Full (Time, Place, and Person)  Thought Content:  Logical  Suicidal  Thoughts:  No  Homicidal Thoughts:  No  Memory:  Immediate;   Good Recent;   Fair Remote;   Fair  Judgement:  Impaired  Insight:  Lacking  Psychomotor Activity:  Decreased  Concentration:  Concentration: Fair  Recall:  [employer] of Knowledge:  Fair  Language:  Fair  Akathisia:  No  Handed:  Right  AIMS (if indicated):     Assets:  Physical Health Social Support  ADL's:  Intact  Cognition:  Impaired,  Mild  Sleep:        Treatment Plan Summary: Plan 75 year old woman with mild dementia who has poor insight into it. She apparently wonders around in her neighborhood and sometimes makes a nuisance. She has now  been declared incompetent and DSS is taking guardianship. Patient is refusing to go to the placement they have selected. Patient does not have any treatable mental health problem. She is not psychotic and does not have a mood disorder. No reason to think that any specific medication or treatment is going to change her opinions about whether or not she should go to live at assisted living. I have explained to the patient that because of the guardianship decision she no longer has the right to decide on her own living situation. She simply makes it clear that she refuses to abide by that idea. We are not going to readmit her to a psychiatric hospital. I will take her off of commitment petition if she is on 1. I reviewed this with social work. Patient needs to be placed.  Disposition: Patient does not meet criteria for psychiatric inpatient admission.  Alethia Berthold, MD 01/02/2016 2:43 PM

## 2016-01-02 NOTE — NC FL2 (Signed)
[place] MEDICAID FL2 LEVEL OF CARE SCREENING TOOL     IDENTIFICATION  Patient Name: Tiffany FinnerJosephine M Schuur Birthdate: 09/03/1940 Sex: female Admission Date (Current Location): 01/01/2016  [facility] [place]   Facility and Address:  [facility], [address], [place], [place] [address]      Provider Number: [id number]  Attending Physician Name and Address:  No att. providers found  Relative Name and Phone Number:       Current Level of Care: Hospital Recommended Level of Care: Assisted Living Facility, Memory Care Prior Approval Number:    Date Approved/Denied:   PASRR Number:    Discharge Plan: Domiciliary (Rest home) (ALF/ Memory Care )    Current Diagnoses: Primary: Dementia  Patient Active Problem List   Diagnosis Date Noted  . 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 Dementia, Alzheimer's, with behavior disturbance 02/16/2015  . Hypertension 02/16/2015    Orientation RESPIRATION BLADDER Height & Weight     Self  Normal Continent Weight: 110 lb (49.896 kg) Height:  5\' 1"  (154.9 cm)  BEHAVIORAL SYMPTOMS/MOOD NEUROLOGICAL BOWEL NUTRITION STATUS   (none)  (none) Continent Diet (SSN: [id number])  AMBULATORY STATUS COMMUNICATION OF NEEDS Skin   Supervision Verbally Surgical wounds                       Personal Care Assistance Level of Assistance  Bathing, Feeding, Dressing Bathing Assistance: Limited assistance Feeding assistance: Independent Dressing Assistance: Limited assistance     Functional Limitations Info  Sight, Hearing, Speech Sight Info: Adequate Hearing Info: Adequate Speech Info: Adequate    SPECIAL CARE FACTORS FREQUENCY                       Contractures      Additional Factors Info  Code Status, Allergies, Psychotropic Code Status Info:  (not on file ) Allergies Info:  (Penicillins, Sulfa Antibiotics)           Current Medications  (01/02/2016):  This is the current hospital active medication list Current Facility-Administered Medications  Medication Dose Route Frequency Provider Last Rate Last Dose  . enalapril (VASOTEC) tablet 20 mg  20 mg Oral QHS Jeanmarie PlantJames A McShane, MD   20 mg at 01/01/16 2123  . rosuvastatin (CRESTOR) tablet 40 mg  40 mg Oral QHS Jeanmarie PlantJames A McShane, MD   40 mg at 01/01/16 2124   Current Outpatient Prescriptions  Medication Sig Dispense Refill  . Artificial Tear Ointment (WH PETROL-MINERAL OIL-LANOLIN) 0.1-0.1 % OINT Place 1 drop into the right eye at bedtime.    Marland Kitchen. aspirin EC 81 MG tablet Take 81 mg by mouth daily.    Marland Kitchen. atorvastatin (LIPITOR) 40 MG tablet Take 40 mg by mouth daily.    . fluticasone (FLONASE) 50 MCG/ACT nasal spray Place 2 sprays into both nostrils daily.    . metoprolol succinate (TOPROL-XL) 50 MG 24 hr tablet Take 50 mg by mouth daily. Take with or immediately following a meal.    . risperiDONE (RISPERDAL) 0.5 MG tablet Take 0.5 mg by mouth every evening.    . traZODone (DESYREL) 50 MG tablet Take 1 tablet by mouth at bedtime.    . enalapril (VASOTEC) 20 MG tablet Take 20 mg by mouth at bedtime.     . rosuvastatin (CRESTOR) 40 MG tablet Take 40 mg by mouth at bedtime.        Discharge Medications: Please  see discharge summary for a list of discharge medications.  Relevant Imaging Results:  Relevant Lab Results:   Additional Information  (SSN: [id number])  Haig ProphetMorgan, Bailey G, LCSW

## 2016-01-02 NOTE — ED Provider Notes (Signed)
-----------------------------------------   7:07 AM on 01/02/2016 -----------------------------------------   Blood pressure 100/50, pulse 74, temperature 98.8 F (37.1 C), temperature source Oral, resp. rate 18, height 5\' 1"  (1.549 m), weight 110 lb (49.896 kg), SpO2 98 %.  The patient had no acute events since last update.  Calm and cooperative at this time.  Disposition is pending per Psychiatry/Behavioral Medicine team recommendations.      Irean HongJade J Sung, MD 01/02/16 [phone]

## 2016-01-02 NOTE — ED Notes (Signed)
Lunch tray delivered to patient.

## 2016-01-02 NOTE — Progress Notes (Signed)
Per Dr. Toni Amendlapacs patient is cleared and does not need an inpatient psych admission. Per chart patient [facility] and [facility] however patient did not want to stay at [facility] and got agitated and was brought to the ED. [place]. CSW contacted [place] [place] APS worker this morning and made her aware of patient's admission to the ED. CSW contacted several APS workers and left voicemail's this afternoon making them aware that patient has been cleared by psych. CSW contacted [facility] administrator Liborio NixonJanice and left her a Engineer, technical salesvoicemail. CSW also contacted [facility] staff member Tammy. Per Joan Floresammy Janice is on vacation starting today and will not be back until Monday. Tammy reported that she does not know if patient can return to [facility] and will have to consult with other colleagues. Per Tammy she will not have an answer for CSW today. CSW will continue to follow and assist as needed.   Jetta LoutBailey Morgan, LCSW [phone]

## 2016-01-03 MED ORDER — ASPIRIN 81 MG PO CHEW
81.0000 mg | CHEWABLE_TABLET | Freq: Every day | ORAL | Status: DC
  Administered 2016-01-05 – 2016-01-07 (×3): 81 mg via ORAL
  Filled 2016-01-03 (×5): qty 1

## 2016-01-03 MED ORDER — METOPROLOL SUCCINATE ER 50 MG PO TB24
50.0000 mg | ORAL_TABLET | Freq: Every day | ORAL | Status: DC
  Administered 2016-01-03 – 2016-01-06 (×3): 50 mg via ORAL
  Filled 2016-01-03 (×5): qty 1

## 2016-01-03 MED ORDER — RISPERIDONE 0.5 MG PO TABS
0.5000 mg | ORAL_TABLET | Freq: Every evening | ORAL | Status: DC
  Administered 2016-01-03 – 2016-01-06 (×3): 0.5 mg via ORAL
  Filled 2016-01-03 (×3): qty 1

## 2016-01-03 MED ORDER — FLUTICASONE PROPIONATE 50 MCG/ACT NA SUSP
2.0000 | Freq: Two times a day (BID) | NASAL | Status: DC
  Administered 2016-01-03 – 2016-01-07 (×9): 2 via NASAL
  Filled 2016-01-03: qty 16

## 2016-01-03 MED ORDER — POLYVINYL ALCOHOL 1.4 % OP SOLN
1.0000 [drp] | Freq: Every day | OPHTHALMIC | Status: DC
  Filled 2016-01-03 (×2): qty 15

## 2016-01-03 MED ORDER — TRAZODONE HCL 50 MG PO TABS: 50.0000 mg | ORAL_TABLET | Freq: Every evening | ORAL | Status: DC | PRN

## 2016-01-03 NOTE — ED Provider Notes (Signed)
-----------------------------------------   7:42 AM on 01/03/2016 -----------------------------------------   Blood pressure 111/47, pulse 78, temperature 97.8 F (36.6 C), temperature source Oral, resp. rate 18, height 5\' 1"  (1.549 m), weight 110 lb (49.896 kg), SpO2 97 %.  The patient had no acute events since last update.  Vitals remain stable. Calm and cooperative at this time.  Disposition is pending per Psychiatry/Behavioral Medicine team recommendations. Social work and DSS are working to coordinate placement for the patient.     Sharman CheekPhillip Jymir Dunaj, MD 01/03/16 [phone]

## 2016-01-03 NOTE — Progress Notes (Signed)
Clinical Child psychotherapistocial Worker (CSW) contacted [facility] and left a message for [employer] administrator to follow up on referral. CSW will continue to follow and assist as needed.   Jetta LoutBailey Morgan, LCSW [phone]

## 2016-01-03 NOTE — ED Notes (Signed)
Pt ambulatory to the BR.Marland Kitchen. Pt is calm and cooperative this morning..Marland Kitchen

## 2016-01-03 NOTE — Progress Notes (Signed)
Clinical Child psychotherapistocial Worker (CSW) received a call from [employer]elon [employer]ammy [facility] staff member stating that she has discussed case with Engineer, maintenance (IT)Janice administrator and they will not be able to accept patient at this time. CSW contacted patient's DSS guardian Tasia CatchingsCraig who reported that [facility] reported that they cannot accept because of her behaviors. Per Tasia Catchingsraig he will call CSW back when he has more information on other placement options.  Jetta LoutBailey Morgan, LCSW [phone]

## 2016-01-03 NOTE — ED Notes (Signed)
Patient given dinner tray and water. Sitting in bed eating and resting comfortably. Expresses no other needs at this time. 

## 2016-01-03 NOTE — ED Notes (Signed)
Pt refused to take aspirin as prescribed. State "I dont want my blood too thin, I might bleed to death" informed pt of the low risk and still pt refused..Marland Kitchen

## 2016-01-04 MED ORDER — TUBERCULIN PPD 5 UNIT/0.1ML ID SOLN
5.0000 [IU] | Freq: Once | INTRADERMAL | Status: AC
Start: 2016-01-04 — End: 2016-01-06
  Administered 2016-01-04: 5 [IU] via INTRADERMAL
  Filled 2016-01-04: qty 0.1

## 2016-01-04 NOTE — Progress Notes (Signed)
LCSW called [phone] Will continue to look for ALF/memory care facilities. None available at this time

## 2016-01-04 NOTE — ED Notes (Signed)
ENVIRONMENTAL ASSESSMENT  Potentially harmful objects out of patient reach: Yes.  Personal belongings secured: Yes.  Patient dressed in hospital provided attire only: Yes.  Plastic bags out of patient reach: Yes.  Patient care equipment (cords, cables, call bells, lines, and drains) shortened, removed, or accounted for: Yes.  Equipment and supplies removed from bottom of stretcher: Yes.  Potentially toxic materials out of patient reach: Yes.  Sharps container removed or out of patient reach: Yes.   BEHAVIORAL HEALTH ROUNDING  Patient sleeping: No.  Patient alert and oriented: yes  Behavior appropriate: Yes. ; If no, describe:  Nutrition and fluids offered: Yes  Toileting and hygiene offered: Yes  Sitter present: not applicable, Q 15 min safety rounds and observation.  Law enforcement present: Yes ODS  ED BHU PLACEMENT JUSTIFICATION  Is the patient under IVC or is there intent for IVC: No Is the patient medically cleared: Yes.  Is there vacancy in the ED BHU: Yes.  Is the population mix appropriate for patient: No Is the patient awaiting placement in inpatient or outpatient setting: Yes. Awaiting transfer on Monday 01/07/16 to [facility].  Has the patient had a psychiatric consult: Yes.  Survey of unit performed for contraband, proper placement and condition of furniture, tampering with fixtures in bathroom, shower, and each patient room: Yes. ; Findings: All clear  APPEARANCE/BEHAVIOR  calm, cooperative and adequate rapport can be established  NEURO ASSESSMENT  Orientation: time, place and person  Hallucinations: No.None noted (Hallucinations)  Speech: Normal  Gait: normal  RESPIRATORY ASSESSMENT  WNL  CARDIOVASCULAR ASSESSMENT  WNL  GASTROINTESTINAL ASSESSMENT  WNL  EXTREMITIES  WNL  PLAN OF CARE  Provide calm/safe environment. Vital signs assessed twice daily. ED BHU Assessment once each 12-hour shift. Collaborate with TTS, social worker daily or as condition indicates.  Assure the ED provider has rounded once each shift. Provide and encourage hygiene. Provide redirection as needed. Assess for escalating behavior; address immediately and inform ED provider.  Assess family dynamic and appropriateness for visitation as needed: Yes. ; If necessary, describe findings:  Educate the patient/family about BHU procedures/visitation: Yes. ; If necessary, describe findings: Pt is calm and cooperative at this time. Pt understanding and accepting of unit procedures/rules. Will continue to monitor with Q 15 min safety rounds and observation via security camera.

## 2016-01-04 NOTE — ED Notes (Signed)
Phone call from lab that they will not be able to complete the TB test until Monday. Will notify Dr. Pershing ProudSchaevitz.

## 2016-01-04 NOTE — ED Notes (Signed)
Patient requested a shower this a.m. Patient was assisted with a shower in the decontamination shower. New sheets were put on her bed too!

## 2016-01-04 NOTE — Consult Note (Signed)
  Psychiatry: Patient is psychiatrically stable. No new complaints. Mostly calm and cooperative. Still little cranky if he tried interviewer but not threatening or dangerous at this point. Insight is poor. Social work is involved in trying to work with DSS on finding a safe living situation for this patient. No indication to change anything about medicine or treatment.

## 2016-01-04 NOTE — Progress Notes (Signed)
[facility] called and LCSW spoke to admissions worker Shanda BumpsJessica, she is coming out today to assess and meet patient. Possible bed only available on Monday. LCSW consulted EDP and explained we are working on it.  Delta Air LinesClaudine Mitzy Naron LCSW [phone]

## 2016-01-04 NOTE — ED Provider Notes (Signed)
-----------------------------------------   6:10 AM on 01/04/2016 -----------------------------------------   Blood pressure 111/51, pulse 80, temperature 98 F (36.7 C), temperature source Oral, resp. rate 18, height 5\' 1"  (1.549 m), weight 49.896 kg, SpO2 97 %.  The patient had no acute events since last update.  Calm and cooperative at this time.  Disposition is pending placement at an appropriate facility.     Loleta Roseory Taino Maertens, MD 01/04/16 [phone]

## 2016-01-04 NOTE — ED Notes (Signed)
Patient is up to use the restroom at this time.

## 2016-01-04 NOTE — Progress Notes (Signed)
LCSW supported patient and AH staff for assessment interview. Patient was dismissive but polite and reported she is scheduled for nose, face, knee surgery tomorrow and that our services were no longer needed. [facility] and will be discharging on Monday. LCSW called patients guardian and notified him pt was accepted. He will contact Gillis Endsaniel Stokes  Consulted with ED Nurse and EDP and he will put in a TB test for patient. LCSW on Sunday afternoon will send all documentation to facility and have pt transported by EMS.

## 2016-01-04 NOTE — ED Notes (Signed)
Patient was given breakfast tray and she is eating her food now.

## 2016-01-04 NOTE — ED Notes (Signed)
Staff from nursing home here to evaluate patient for possible placement.  Pt told ED tech she did not want anyone else to visit about her going to stay somewhere else. Pt is under DSS care with guardian.

## 2016-01-04 NOTE — Progress Notes (Signed)
LCSW recalled AH and Shanda Bumpsjessica will becoming before 5pm today. Refaxed information on patient. Called Anselmo RodCraig  Clay  [phone] and he understands patient may not be placed. Several other facilities have been called and there are no current beds available. LCSW still working on it.   Delta Air LinesClaudine Virdell Hoiland LCSW [phone]

## 2016-01-04 NOTE — ED Provider Notes (Signed)
-----------------------------------------   6:14 PM on 01/04/2016 -----------------------------------------   Blood pressure 97/55, pulse 81, temperature 98.3 F (36.8 C), temperature source Oral, resp. rate 18, height 5\' 1"  (1.549 m), weight 110 lb (49.896 kg), SpO2 97 %.  Patient became agitated and was put in mitts. Discussed with social worker who says that the patient will likely not be able to be dispositioned until this Monday. Also requires PPD for tuberculosis screening for placement.    Myrna Blazeravid Matthew Schaevitz, MD 01/04/16 [phone]

## 2016-01-05 NOTE — ED Notes (Signed)
Pt up to bathroom walking with cane steady gait.

## 2016-01-05 NOTE — ED Notes (Signed)
BEHAVIORAL HEALTH ROUNDING  Patient sleeping: No.  Patient alert and oriented: yes  Behavior appropriate: Yes. ; If no, describe:  Nutrition and fluids offered: Yes  Toileting and hygiene offered: Yes  Sitter present: not applicable, Q 15 min safety rounds and observation.  Law enforcement present: Yes ODS  

## 2016-01-05 NOTE — ED Provider Notes (Signed)
-----------------------------------------   6:45 AM on 01/05/2016 -----------------------------------------   Blood pressure 102/48, pulse 78, temperature 98.9 F (37.2 C), temperature source Oral, resp. rate 16, height 5\' 1"  (1.549 m), weight 49.896 kg, SpO2 98 %.  The patient had no acute events since last update.  Calm and cooperative at this time.  Disposition is pending per Psychiatry/Behavioral Medicine team recommendations.     Loleta Roseory Nyjae Hodge, MD 01/05/16 [phone]

## 2016-01-05 NOTE — ED Notes (Signed)

## 2016-01-05 NOTE — ED Notes (Signed)
BEHAVIORAL HEALTH ROUNDING Patient sleeping: Yes.   Patient alert and oriented: not applicable SLEEPING Behavior appropriate: Yes.  ; If no, describe: SLEEPING Nutrition and fluids offered: No SLEEPING Toileting and hygiene offered: NoSLEEPING Sitter present: not applicable, Q 15 min safety rounds and observation. Law enforcement present: Yes ODS 

## 2016-01-05 NOTE — ED Notes (Signed)
BEHAVIORAL HEALTH ROUNDING Patient sleeping: Yes.   Patient alert and oriented: not applicable SLEEPING Behavior appropriate: Yes.  ; If no, describe: SLEEPING Nutrition and fluids offered: No SLEEPING Toileting and hygiene offered: NoSLEEPING Sitter present: not applicable, Q 15 min safety rounds and observation via security camera. Law enforcement present: Yes ODS 

## 2016-01-05 NOTE — Progress Notes (Deleted)
Spoke with DR. Hernandez update on pt., pt to remain in ED overnight, re-evaluate in the a.m. This counselor informed DR.Derrill KayGoodman that pt needed orders for insulin and meds to assure pt has them for overnight stay in ER. Tiffany Turner, LCAS-A, LPC-A, [facility]  Counselor 01/05/2016 5:09 PM

## 2016-01-05 NOTE — ED Notes (Signed)

## 2016-01-05 NOTE — ED Notes (Signed)
Dinner is served

## 2016-01-05 NOTE — ED Notes (Signed)
Pt eating lunch

## 2016-01-06 NOTE — ED Notes (Signed)
BEHAVIORAL HEALTH ROUNDING  Patient sleeping: No.  Patient alert and oriented: yes  Behavior appropriate: Yes. ; If no, describe:  Nutrition and fluids offered: Yes  Toileting and hygiene offered: Yes  Sitter present: not applicable, Q 15 min safety rounds and observation.  Law enforcement present: Yes ODS  

## 2016-01-06 NOTE — ED Notes (Signed)

## 2016-01-06 NOTE — ED Provider Notes (Signed)
-----------------------------------------   5:46 AM on 01/06/2016 -----------------------------------------   Blood pressure 100/46, pulse 82, temperature 98.7 F (37.1 C), temperature source Oral, resp. rate 20, height 5\' 1"  (1.549 m), weight 110 lb (49.896 kg), SpO2 96 %.  The patient had no acute events since last update.  Calm and cooperative at this time.  Disposition is pending per Psychiatry/Behavioral Medicine team recommendations.     Irean HongJade J Karlyn Glasco, MD 01/06/16 [phone]

## 2016-01-06 NOTE — ED Notes (Signed)
BEHAVIORAL HEALTH ROUNDING Patient sleeping: Yes.   Patient alert and oriented: not applicable SLEEPING Behavior appropriate: Yes.  ; If no, describe: SLEEPING Nutrition and fluids offered: No SLEEPING Toileting and hygiene offered: NoSLEEPING Sitter present: not applicable, Q 15 min safety rounds and observation. Law enforcement present: Yes ODS 

## 2016-01-06 NOTE — ED Notes (Signed)

## 2016-01-07 DIAGNOSIS — F99 Mental disorder, not otherwise specified: Secondary | ICD-10-CM | POA: Diagnosis not present

## 2016-01-07 DIAGNOSIS — Z743 Need for continuous supervision: Secondary | ICD-10-CM | POA: Diagnosis not present

## 2016-01-07 NOTE — ED Notes (Signed)
ENVIRONMENTAL ASSESSMENT  Potentially harmful objects out of patient reach: Yes.  Personal belongings secured: Yes.  Patient dressed in hospital provided attire only: Yes.  Plastic bags out of patient reach: Yes.  Patient care equipment (cords, cables, call bells, lines, and drains) shortened, removed, or accounted for: Yes.  Equipment and supplies removed from bottom of stretcher: Yes.  Potentially toxic materials out of patient reach: Yes.  Sharps container removed or out of patient reach: Yes.   BEHAVIORAL HEALTH ROUNDING  Patient sleeping: No.  Patient alert and oriented: yes  Behavior appropriate: Yes. ; If no, describe:  Nutrition and fluids offered: Yes  Toileting and hygiene offered: Yes  Sitter present: not applicable, Q 15 min safety rounds and observation.  Law enforcement present: Yes ODS  APPEARANCE/BEHAVIOR  calm, cooperative and adequate rapport can be established  NEURO ASSESSMENT  Orientation: time, place and person  Hallucinations: No.None noted (Hallucinations)  Speech: Normal  Gait: normal steady with use of a cane RESPIRATORY ASSESSMENT  WNL  CARDIOVASCULAR ASSESSMENT  WNL  GASTROINTESTINAL ASSESSMENT  WNL  EXTREMITIES  ROM of all joints is normal  PLAN OF CARE  Provide calm/safe environment. Vital signs assessed twice daily. ED BHU Assessment once each 12-hour shift. Collaborate with TTS and social worker daily or as condition indicates. Assure the ED provider has rounded once each shift. Provide and encourage hygiene. Provide redirection as needed. Assess for escalating behavior; address immediately and inform ED provider.  Assess family dynamic and appropriateness for visitation as needed: Yes. ; If necessary, describe findings:  Educate the patient/family about BHU procedures/visitation: Yes. ; If necessary, describe findings:   Pt is calm and cooperative at this time. Pt understands she is waiting discharge in the morning pending TB skin test results to  an assisted living facility.

## 2016-01-07 NOTE — ED Notes (Signed)
BEHAVIORAL HEALTH ROUNDING Patient sleeping: Yes.   Patient alert and oriented: not applicable SLEEPING Behavior appropriate: Yes.  ; If no, describe: SLEEPING Nutrition and fluids offered: No SLEEPING Toileting and hygiene offered: NoSLEEPING Sitter present: not applicable, Q 15 min safety rounds and observation. Law enforcement present: Yes ODS 

## 2016-01-07 NOTE — ED Provider Notes (Signed)
-----------------------------------------   3:40 AM on 01/07/2016 -----------------------------------------   Blood pressure 97/51, pulse 75, temperature 98.2 F (36.8 C), temperature source Oral, resp. rate 18, height 5\' 1"  (1.549 m), weight 110 lb (49.896 kg), SpO2 95 %.  The patient had no acute events since last update.  Calm and cooperative at this time.  Disposition is pending per Psychiatry/Behavioral Medicine team recommendations.     Arnaldo NatalPaul F Ellamarie Naeve, MD 01/07/16 [phone]

## 2016-01-07 NOTE — ED Notes (Signed)
BEHAVIORAL HEALTH ROUNDING  Patient sleeping: No.  Patient alert and oriented: yes  Behavior appropriate: Yes. ; If no, describe:  Nutrition and fluids offered: Yes  Toileting and hygiene offered: Yes  Sitter present: not applicable, Q 15 min safety rounds and observation.  Law enforcement present: Yes ODS  

## 2016-01-07 NOTE — ED Notes (Signed)
Meal tray given to pt along with a cup of ginger ale.

## 2016-01-07 NOTE — ED Provider Notes (Signed)
Patient is being discharged [facility]. She remains medically stable for discharge.  Emily FilbertJonathan E Brookes Craine, MD 01/07/16 1420

## 2016-01-07 NOTE — ED Notes (Signed)
BEHAVIORAL HEALTH ROUNDING Patient sleeping: Yes.   Patient alert and oriented: sleeping Behavior appropriate: Yes.  ; If no, describe: sleeping Nutrition and fluids offered: Yes  Toileting and hygiene offered: Yes  Sitter present: q 15 minute checks Law enforcement present: Yes

## 2016-01-07 NOTE — ED Notes (Signed)
BEHAVIORAL HEALTH ROUNDING Patient sleeping: No. Patient alert and oriented: yes Behavior appropriate: Yes.  ; If no, describe:  Nutrition and fluids offered: Yes  Toileting and hygiene offered: Yes  Sitter present: q 15 min checks Law enforcement present: Yes  

## 2016-01-07 NOTE — Discharge Instructions (Signed)
Alzheimer Disease °Alzheimer disease is a mental disorder. It causes memory loss and loss of other mental functions, such as learning, thinking, problem solving, communicating, and completing tasks. The mental losses interfere with the ability to perform daily activities at work, at home, or in social situations. °Alzheimer disease usually starts in a person's late 60s or early 70s but can start earlier in life (familial form). The mental changes caused by this disease are permanent and worsen over time. As the illness progresses, the ability to do even the simplest things is lost. Survival with Alzheimer disease ranges from several years to as long as 20 years. °CAUSES °Alzheimer disease is caused by abnormally high levels of a protein (beta-amyloid) in the brain. This protein forms very small deposits within and around the brain's nerve cells. These deposits prevent the nerve cells from working properly. Experts are not certain what causes the beta-amyloid deposits in this disease. °RISK FACTORS °The following major risk factors have been identified: °· Increasing age. °· Certain genetic variations, such as Down syndrome (trisomy 21). °SYMPTOMS °In the early stages of Alzheimer disease, you are still able to perform daily activities but need greater effort, more time, or memory aids. Early symptoms include: °· Mild memory loss of recent events, names, or phone numbers. °· Loss of objects. °· Minor loss of vocabulary. °· Difficulty with complex tasks, such as paying bills or driving in unfamiliar locations. °Other mental functions deteriorate as the disease worsens. These changes slowly go from mild to severe. Symptoms at this stage include: °· Difficulty remembering. You may not be able to recall personal information such as your address and telephone number. You may become confused about the date, the season of the year, or your location. °· Difficulty maintaining attention. You may forget what you wanted to say  during conversations and repeat what you have already said. °· Difficulty learning new information or tasks. You may not remember what you read or the name of a new friend you met. °· Difficulty counting or doing math. You may have difficulty with complex math problems. You may make mistakes in paying bills or managing your checkbook. °· Poor reasoning and judgment. You may make poor decisions or not dress right for the weather. °· Difficulty communicating. You may have regular difficulty remembering words, naming objects, expressing yourself clearly, or writing sentences that make sense. °· Difficulty performing familiar daily activities. You may get lost driving in familiar locations or need help eating, bathing, dressing, grooming, or using the toilet. You may have difficulty maintaining bladder or bowel control. °· Difficulty recognizing familiar faces. You may confuse family members or close friends with one another. You may not recognize a close relative or may mistake strangers for family. °Alzheimer disease also may cause changes in personality and behavior. These changes include:  °· Loss of interest or motivation. °· Social withdrawal. °· Anxiety. °· Difficulty sleeping. °· Uncharacteristic anger or combativeness. °· A false belief that someone is trying to harm you (paranoia). °· Seeing things that are not real (hallucinations). °· Agitation. °Confusion and disruptive behavior are often worse at night and may be triggered by changes in the environment or acute medical issues. °DIAGNOSIS  °Alzheimer disease is diagnosed through an assessment by your health care provider. During this assessment, your health care provider will do the following: °· Ask you and your family, friends, or caregivers questions about your symptoms, their frequency, their duration and progression, and the effect they are having on your life. °·   Ask questions about your personal and family medical history and use of alcohol or drugs,  including prescription medicine.  Perform a physical exam and order blood tests and brain imaging exams. Your health care provider may refer you to a specialist for detailed evaluation of your mental functions (neuropsychological testing).  Many different brain disorders, medical conditions, and certain substances can cause symptoms that resemble Alzheimer disease symptoms. These must be ruled out before this disease can be diagnosed. If Alzheimer disease is diagnosed, it will be considered either "possible" or "probable" Alzheimer disease. "Possible" Alzheimer disease means that your symptoms are typical of the disease and no other disorder is causing them. "Probable" Alzheimer disease means that you also have a family history of the disease or genetic test results that support the diagnosis. Certain tests, mostly used in research studies, are highly specific for Alzheimer disease.  TREATMENT  There is currently no cure for this disease. The goals of treatment are to:  Slow down the progression of the disease.  Preserve mental function as long as possible.  Manage behavioral symptoms.  Make life easier for the person with Alzheimer disease and his or her caregivers. The following treatment options are available:  Medicine. Certain medicines may help slow memory loss by changing the level of certain chemicals in the brain. Medicine may also help with behavioral symptoms.  Talk therapy. Talk therapy provides education, support, and memory aids for people with this disease. It is most effective in the early stages of the illness.  Caregiving. Caregivers may be family members, friends, or trained medical professionals. They help the person with Alzheimer disease with daily life activities. Caregiving may take place at home or at a nursing facility.  Family support groups. These provide education, emotional support, and information about community resources to family members who are taking care of  the person with this disease.   This information is not intended to replace advice given to you by your health care provider. Make sure you discuss any questions you have with your health care provider.   Document Released: 03/04/2004 Document Revised: 07/14/2014 Document Reviewed: 10/29/2012 Elsevier Interactive Patient Education [employer].

## 2016-01-07 NOTE — ED Notes (Addendum)
TB test is negative.  Area marked where tb test was administered shows no reaction.  Skin color is within normal limits.  No redness or induration noted.

## 2016-01-07 NOTE — Progress Notes (Signed)
CSW engaged with pt at her beside to inform her that she would be transported by EMS to [facility] shortly. Faxed FL-2, TB test results, and MD notes to [facility] at fax number [phone]. They are in agreement to accept pt today. CSW also notified pt's DSS guardian, Anselmo RodCraig Clay, about status of pt's placement-faxed FL-2 to him as well. No other CSW needs at this time. CSW signing off.  Jonathon JordanLynn B Tymara Saur, LCSWA [phone]

## 2016-01-07 NOTE — ED Notes (Signed)
RN spoke with DeDe at [facility]. DeDe reported she knew about the patient already through social workers. No new updates give to staff. Pt taken via EMS and [facility] made aware that pt was on the way.

## 2016-03-25 DIAGNOSIS — G300 Alzheimer's disease with early onset: Secondary | ICD-10-CM | POA: Diagnosis not present

## 2016-03-25 DIAGNOSIS — R2689 Other abnormalities of gait and mobility: Secondary | ICD-10-CM | POA: Diagnosis not present

## 2016-03-25 DIAGNOSIS — I10 Essential (primary) hypertension: Secondary | ICD-10-CM | POA: Diagnosis not present

## 2016-03-25 DIAGNOSIS — I2510 Atherosclerotic heart disease of native coronary artery without angina pectoris: Secondary | ICD-10-CM | POA: Diagnosis not present

## 2016-05-06 DIAGNOSIS — G309 Alzheimer's disease, unspecified: Secondary | ICD-10-CM | POA: Diagnosis not present

## 2016-05-06 DIAGNOSIS — R54 Age-related physical debility: Secondary | ICD-10-CM | POA: Diagnosis not present

## 2016-05-06 DIAGNOSIS — J449 Chronic obstructive pulmonary disease, unspecified: Secondary | ICD-10-CM | POA: Diagnosis not present

## 2016-05-06 DIAGNOSIS — I10 Essential (primary) hypertension: Secondary | ICD-10-CM | POA: Diagnosis not present

## 2016-05-09 ENCOUNTER — Emergency Department
Admission: EM | Admit: 2016-05-09 | Discharge: 2016-05-10 | Disposition: A | Discharge status: Home / Self Care | Payer: Medicare Other | Attending: Emergency Medicine | Admitting: Emergency Medicine

## 2016-05-09 ENCOUNTER — Encounter: Payer: Self-pay | Admitting: Emergency Medicine

## 2016-05-09 DIAGNOSIS — G300 Alzheimer's disease with early onset: Secondary | ICD-10-CM | POA: Diagnosis not present

## 2016-05-09 DIAGNOSIS — Z79899 Other long term (current) drug therapy: Secondary | ICD-10-CM | POA: Insufficient documentation

## 2016-05-09 DIAGNOSIS — I2510 Atherosclerotic heart disease of native coronary artery without angina pectoris: Secondary | ICD-10-CM | POA: Diagnosis not present

## 2016-05-09 DIAGNOSIS — F17210 Nicotine dependence, cigarettes, uncomplicated: Secondary | ICD-10-CM | POA: Insufficient documentation

## 2016-05-09 DIAGNOSIS — F918 Other conduct disorders: Secondary | ICD-10-CM | POA: Insufficient documentation

## 2016-05-09 DIAGNOSIS — F02818 Dementia in other diseases classified elsewhere, unspecified severity, with other behavioral disturbance: Secondary | ICD-10-CM | POA: Diagnosis present

## 2016-05-09 DIAGNOSIS — F0281 Dementia in other diseases classified elsewhere with behavioral disturbance: Secondary | ICD-10-CM | POA: Insufficient documentation

## 2016-05-09 DIAGNOSIS — R4689 Other symptoms and signs involving appearance and behavior: Secondary | ICD-10-CM

## 2016-05-09 DIAGNOSIS — I10 Essential (primary) hypertension: Secondary | ICD-10-CM | POA: Insufficient documentation

## 2016-05-09 DIAGNOSIS — Z7982 Long term (current) use of aspirin: Secondary | ICD-10-CM | POA: Insufficient documentation

## 2016-05-09 LAB — URINE DRUG SCREEN, QUALITATIVE (ARMC ONLY)
AMPHETAMINES, UR SCREEN: NOT DETECTED
BARBITURATES, UR SCREEN: NOT DETECTED
BENZODIAZEPINE, UR SCRN: NOT DETECTED
CANNABINOID 50 NG, UR ~~LOC~~: NOT DETECTED
Cocaine Metabolite,Ur ~~LOC~~: NOT DETECTED
MDMA (Ecstasy)Ur Screen: NOT DETECTED
Methadone Scn, Ur: NOT DETECTED
OPIATE, UR SCREEN: NOT DETECTED
PHENCYCLIDINE (PCP) UR S: NOT DETECTED
Tricyclic, Ur Screen: NOT DETECTED

## 2016-05-09 LAB — URINALYSIS COMPLETE WITH MICROSCOPIC (ARMC ONLY)
BILIRUBIN URINE: NEGATIVE
GLUCOSE, UA: NEGATIVE mg/dL
KETONES UR: NEGATIVE mg/dL
Nitrite: NEGATIVE
PH: 5 (ref 5.0–8.0)
Protein, ur: NEGATIVE mg/dL
Specific Gravity, Urine: 1.012 (ref 1.005–1.030)

## 2016-05-09 LAB — CBC
HEMATOCRIT: 37.6 % (ref 35.0–47.0)
Hemoglobin: 13.1 g/dL (ref 12.0–16.0)
MCH: 34 pg (ref 26.0–34.0)
MCHC: 34.9 g/dL (ref 32.0–36.0)
MCV: 97.4 fL (ref 80.0–100.0)
PLATELETS: 304 10*3/uL (ref 150–440)
RBC: 3.86 MIL/uL (ref 3.80–5.20)
RDW: 12.9 % (ref 11.5–14.5)
WBC: 7.6 10*3/uL (ref 3.6–11.0)

## 2016-05-09 LAB — COMPREHENSIVE METABOLIC PANEL
ALK PHOS: 99 U/L (ref 38–126)
ALT: 26 U/L (ref 14–54)
AST: 47 U/L — AB (ref 15–41)
Albumin: 3.7 g/dL (ref 3.5–5.0)
Anion gap: 9 (ref 5–15)
BILIRUBIN TOTAL: 0.5 mg/dL (ref 0.3–1.2)
BUN: 18 mg/dL (ref 6–20)
CALCIUM: 9.6 mg/dL (ref 8.9–10.3)
CO2: 25 mmol/L (ref 22–32)
Chloride: 106 mmol/L (ref 101–111)
Creatinine, Ser: 1.22 mg/dL — ABNORMAL HIGH (ref 0.44–1.00)
GFR calc Af Amer: 49 mL/min — ABNORMAL LOW (ref >60)
GFR, EST NON AFRICAN AMERICAN: 42 mL/min — AB (ref >60)
GLUCOSE: 124 mg/dL — AB (ref 65–99)
POTASSIUM: 4.3 mmol/L (ref 3.5–5.1)
Sodium: 140 mmol/L (ref 135–145)
TOTAL PROTEIN: 8.7 g/dL — AB (ref 6.5–8.1)

## 2016-05-09 LAB — ACETAMINOPHEN LEVEL: Acetaminophen (Tylenol), Serum: <10 ug/mL — ABNORMAL LOW (ref 10–30)

## 2016-05-09 LAB — SALICYLATE LEVEL: Salicylate Lvl: <7 mg/dL (ref 2.8–30.0)

## 2016-05-09 LAB — ETHANOL

## 2016-05-09 MED ORDER — RISPERIDONE 0.5 MG PO TBDP: 0.5000 mg | ORAL_TABLET | Freq: Two times a day (BID) | ORAL | 1 refills | Status: DC

## 2016-05-09 MED ORDER — FOSFOMYCIN TROMETHAMINE 3 G PO PACK
3.0000 g | PACK | Freq: Once | ORAL | Status: AC
  Administered 2016-05-09: 3 g via ORAL
  Filled 2016-05-09: qty 3

## 2016-05-09 NOTE — Progress Notes (Signed)
LCSW received a call from [place]- [place] just wanting an update on her patient. She is happy to hear there were no further incidents and that she will have a PRN home with her.  No further needs   [employer] Air LinesClaudine Vedant Shehadeh LCSW [phone]

## 2016-05-09 NOTE — ED Notes (Signed)
Gearldine BienenstockBrandy was called and updated that pt would be given medication to help decrease irritability. Pt informed of medication and Brandy informed of prescription and UTI treatment in ED.

## 2016-05-09 NOTE — ED Notes (Signed)
Blood sent to lab

## 2016-05-09 NOTE — ED Provider Notes (Signed)
-----------------------------------------   5:25 PM on 05/09/2016 -----------------------------------------   Patient initially with aggressive behavior but has been seen and evaluated by the psychiatrist and has been deemed safe for discharge with outpatient follow-up.  Found to have evidence of a UTI as well on her UA. We'll give one time dose of fosfomycin.    On my evaluation the patient is calm and cooperative. She is not in a state of agitation. Will be discharged according to psychiatric recommendations with a prescription for risperidone.     Myrna Blazeravid Matthew Syaire Saber, MD 05/09/16 1726
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
## 2016-05-09 NOTE — Progress Notes (Addendum)
LCSW called [facility] [phone] spoke to supervisor and informed them patient is to be picked up and returned to her home as she has been medically and psychiatrically stable. Spoke to Producer, television/film/videodirector and nursing director Tracey and Dr Toni Amendlapacs has agreed to send her home with a script.  EDP notified. To be sent home by EMS  [facility] Hospital[facility]audine Seanpatrick Maisano LCSW [phone]

## 2016-05-09 NOTE — ED Notes (Addendum)
Report also called to [facility]. PT left ED with [facility].

## 2016-05-09 NOTE — ED Triage Notes (Signed)
Brought in [facility], pt has dementia.  Very aggressive to ER staff.

## 2016-05-09 NOTE — ED Notes (Signed)
This RN [facility] and spoke to [place] about pts hospital stay. RN reported they are refusing to take pt back unless there are PRN medications for agitation. This RN reports she will talk to MD and charge nurse to discover solution. As far as transportation home, Gearldine BienenstockBrandy reports EMS will have to be called because they do not have transportation and there is no family to call.

## 2016-05-09 NOTE — ED Provider Notes (Addendum)
[facility] Emergency Department Provider Note  ____________________________________________   I have reviewed the triage vital signs and the nursing notes.   HISTORY  Chief Complaint Aggressive Behavior    HPI Tiffany Turner is a 75 y.o. female According to notes patient has been "aggressive" at the facility where she stays. She has a history of this. She denies everything. She has no complaints.Does have a history of aggressive behavior apparently in the past.      Past Medical History:  Diagnosis Date  . Coronary artery disease    stents placed  . Hip dislocation, right (HCC)    hx of dislocation  . Hyperlipemia   . Hypertension     Patient Active Problem List   Diagnosis Date Noted  . 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 Dementia, Alzheimer's, with behavior disturbance 02/16/2015  . Hypertension 02/16/2015    Past Surgical History:  Procedure Laterality Date  . CARDIAC SURGERY     stents  . CORONARY ANGIOPLASTY WITH STENT PLACEMENT      Prior to Admission medications   Medication Sig Start Date End Date Taking? Authorizing Provider  Artificial Tear Ointment ([place] PETROL-MINERAL OIL-LANOLIN) 0.1-0.1 % OINT Place 1 drop into the right eye at bedtime. 01/01/16   Historical Provider, MD  aspirin 81 MG chewable tablet Chew 81 mg by mouth daily.    Historical Provider, MD  atorvastatin (LIPITOR) 40 MG tablet Take 40 mg by mouth daily.    Historical Provider, MD  enalapril (VASOTEC) 20 MG tablet Take 20 mg by mouth at bedtime.     Historical Provider, MD  fluticasone (FLONASE) 50 MCG/ACT nasal spray Place 2 sprays into both nostrils every 12 (twelve) hours.     Historical Provider, MD  metoprolol succinate (TOPROL-XL) 50 MG 24 hr tablet Take 50 mg by mouth daily. Take with or immediately following a meal.    Historical Provider, MD  traZODone (DESYREL) 50 MG tablet Take 1 tablet by mouth at bedtime  as needed for sleep.  01/01/16 01/31/16  Historical Provider, MD    Allergies Penicillins and Sulfa antibiotics  No family history on file.  Social History Social History  Substance Use Topics  . Smoking status: Current Every Day Smoker    Packs/day: 0.50    Types: Cigarettes  . Smokeless tobacco: Never Used  . Alcohol use No    Review of Systems Constitutional: No fever/chills Eyes: No visual changes. ENT: No sore throat. No stiff neck no neck pain Cardiovascular: Denies chest pain. Respiratory: Denies shortness of breath. Gastrointestinal:   no vomiting.  No diarrhea.  No constipation. Genitourinary: Negative for dysuria. Musculoskeletal: Negative lower extremity swelling Skin: Negative for rash. Neurological: Negative for severe headaches, focal weakness or numbness. 10-point ROS otherwise negative.  ____________________________________________   PHYSICAL EXAM:  VITAL SIGNS: ED Triage Vitals  Enc Vitals Group     BP      Pulse      Resp      Temp      Temp src      SpO2      Weight      Height      Head Circumference      Peak Flow      Pain Score      Pain Loc      Pain Edu?      Excl. in GC?     Constitutional: Alert and oriented. Well appearing and  in no acute distress. Eyes: Conjunctivae are normal. PERRL. EOMI. Head: Atraumatic. Nose: No congestion/rhinnorhea. Mouth/Throat: Mucous membranes are moist.  Oropharynx non-erythematous. Neck: No stridor.   Nontender with no meningismus Cardiovascular: Normal rate, regular rhythm. Grossly normal heart sounds.  Good peripheral circulation. Respiratory: Normal respiratory effort.  No retractions. Lungs CTAB. Abdominal: Soft and nontender. No distention. No guarding no rebound Back:  There is no focal tenderness or step off.  there is no midline tenderness there are no lesions noted. there is no CVA tenderness Musculoskeletal: No lower extremity tenderness, no upper extremity tenderness. No joint  effusions, no DVT signs strong distal pulses no edema Neurologic:  Normal speech and language. No gross focal neurologic deficits are appreciated.  Skin:  Skin is warm, dry and intact. No rash noted. Psychiatric: Mood and affect are normal. Speech and behavior are normal.  ____________________________________________   LABS (all labs ordered are listed, but only abnormal results are displayed)  Labs Reviewed  COMPREHENSIVE METABOLIC PANEL - Abnormal; Notable for the following:       Result Value   Glucose, Bld 124 (*)    Creatinine, Ser 1.22 (*)    Total Protein 8.7 (*)    AST 47 (*)    GFR calc non Af Amer 42 (*)    GFR calc Af Amer 49 (*)    All other components within normal limits  ACETAMINOPHEN LEVEL - Abnormal; Notable for the following:    Acetaminophen (Tylenol), Serum <10 (*)    All other components within normal limits  ETHANOL  SALICYLATE LEVEL  CBC  URINE DRUG SCREEN, QUALITATIVE ([facility] ONLY)   ____________________________________________  EKG  I personally interpreted any EKGs ordered by me or triage  ____________________________________________  RADIOLOGY  I reviewed any imaging ordered by me or triage that were performed during my shift and, if possible, patient and/or family made aware of any abnormal findings. ____________________________________________   PROCEDURES  Procedure(s) performed: None  Procedures  Critical Care performed: None  ____________________________________________   INITIAL IMPRESSION / ASSESSMENT AND PLAN / ED COURSE  Pertinent labs & imaging results that were available during my care of the patient were reviewed by me and considered in my medical decision making (see chart for details).  Patient here for alleged aggressiveness, IVC paperwork taken out. Being seen by psychiatry.  ----------------------------------------- 2:54 PM on 05/09/2016 -----------------------------------------  Psychiatry feels that she is  safe for d/c. At this time there is no evidence of acute psychiatric decompensation.  We are awaiting ua. If negative we will likely be able to medically clear her for d/c. Pt remains calm and cooperative here.   Psych has d/w nursing home and will start with risperdal which has worked in the past.   Clinical Course   ____________________________________________   FINAL CLINICAL IMPRESSION(S) / ED DIAGNOSES  Final diagnoses:  None      This chart was dictated using voice recognition software.  Despite best efforts to proofread,  errors can occur which can change meaning.      Jeanmarie PlantJames A Angelgabriel Willmore, MD 05/09/16 1456    Jeanmarie PlantJames A Shivank Pinedo, MD 05/09/16 [id number]    Jeanmarie PlantJames A Sherie Dobrowolski, MD 05/09/16 [phone]

## 2016-05-11 LAB — URINE CULTURE

## 2016-05-12 NOTE — Progress Notes (Signed)
ED Antimicrobial Stewardship Positive Culture Follow Up   Tiffany Turner is an 75 y.o. female who presented to [facility] on 05/09/2016 with a chief complaint of  Chief Complaint  Patient presents with  . Aggressive Behavior    Recent Results (from the past 720 hour(s))  Urine culture     Status: Abnormal   Collection Time: 05/09/16  3:20 PM  Result Value Ref Range Status   Specimen Description URINE, RANDOM  Final   Special Requests NONE  Final   Culture >=100,000 COLONIES/mL KLEBSIELLA PNEUMONIAE (A)  Final   Report Status 05/11/2016 FINAL  Final   Organism ID, Bacteria KLEBSIELLA PNEUMONIAE (A)  Final      Susceptibility   Klebsiella pneumoniae - MIC*    AMPICILLIN >=32 RESISTANT Resistant     CEFAZOLIN <=4 SENSITIVE Sensitive     CEFTRIAXONE <=1 SENSITIVE Sensitive     CIPROFLOXACIN <=0.25 SENSITIVE Sensitive     GENTAMICIN <=1 SENSITIVE Sensitive     IMIPENEM <=0.25 SENSITIVE Sensitive     NITROFURANTOIN 128 RESISTANT Resistant     TRIMETH/SULFA <=20 SENSITIVE Sensitive     AMPICILLIN/SULBACTAM 16 INTERMEDIATE Intermediate     PIP/TAZO 16 SENSITIVE Sensitive     Extended ESBL NEGATIVE Sensitive     * >=100,000 COLONIES/mL KLEBSIELLA PNEUMONIAE    [x]  Patient discharged originally without antimicrobial agent and treatment is now indicated  [facility] [place] [facility] and obtained fax number. Faxed culture to MD at [phone].  Cindi CarbonMary M Kamdin Follett, PharmD, BCPS 05/12/2016, 1:52 PM

## 2016-05-13 DIAGNOSIS — J449 Chronic obstructive pulmonary disease, unspecified: Secondary | ICD-10-CM | POA: Diagnosis not present

## 2016-05-13 DIAGNOSIS — I10 Essential (primary) hypertension: Secondary | ICD-10-CM | POA: Diagnosis not present

## 2016-05-13 DIAGNOSIS — F0281 Dementia in other diseases classified elsewhere with behavioral disturbance: Secondary | ICD-10-CM | POA: Diagnosis not present

## 2016-05-13 DIAGNOSIS — R54 Age-related physical debility: Secondary | ICD-10-CM | POA: Diagnosis not present

## 2016-05-30 DIAGNOSIS — R0602 Shortness of breath: Secondary | ICD-10-CM | POA: Diagnosis not present

## 2016-06-03 DIAGNOSIS — R54 Age-related physical debility: Secondary | ICD-10-CM | POA: Diagnosis not present

## 2016-06-03 DIAGNOSIS — I10 Essential (primary) hypertension: Secondary | ICD-10-CM | POA: Diagnosis not present

## 2016-06-03 DIAGNOSIS — F0281 Dementia in other diseases classified elsewhere with behavioral disturbance: Secondary | ICD-10-CM | POA: Diagnosis not present

## 2016-06-09 DIAGNOSIS — F321 Major depressive disorder, single episode, moderate: Secondary | ICD-10-CM | POA: Diagnosis not present

## 2016-06-09 DIAGNOSIS — F0391 Unspecified dementia with behavioral disturbance: Secondary | ICD-10-CM | POA: Diagnosis not present

## 2016-06-18 DIAGNOSIS — L84 Corns and callosities: Secondary | ICD-10-CM | POA: Diagnosis not present

## 2016-06-18 DIAGNOSIS — M2010 Hallux valgus (acquired), unspecified foot: Secondary | ICD-10-CM | POA: Diagnosis not present

## 2016-06-18 DIAGNOSIS — B351 Tinea unguium: Secondary | ICD-10-CM | POA: Diagnosis not present

## 2016-06-18 DIAGNOSIS — I739 Peripheral vascular disease, unspecified: Secondary | ICD-10-CM | POA: Diagnosis not present

## 2016-07-03 DIAGNOSIS — I10 Essential (primary) hypertension: Secondary | ICD-10-CM | POA: Diagnosis not present

## 2016-07-03 DIAGNOSIS — F0281 Dementia in other diseases classified elsewhere with behavioral disturbance: Secondary | ICD-10-CM | POA: Diagnosis not present

## 2016-07-03 DIAGNOSIS — R54 Age-related physical debility: Secondary | ICD-10-CM | POA: Diagnosis not present

## 2016-07-17 DIAGNOSIS — R05 Cough: Secondary | ICD-10-CM | POA: Diagnosis not present

## 2016-07-21 DIAGNOSIS — I10 Essential (primary) hypertension: Secondary | ICD-10-CM | POA: Diagnosis not present

## 2016-07-21 DIAGNOSIS — R54 Age-related physical debility: Secondary | ICD-10-CM | POA: Diagnosis not present

## 2016-07-21 DIAGNOSIS — J40 Bronchitis, not specified as acute or chronic: Secondary | ICD-10-CM | POA: Diagnosis not present

## 2016-07-21 DIAGNOSIS — F0281 Dementia in other diseases classified elsewhere with behavioral disturbance: Secondary | ICD-10-CM | POA: Diagnosis not present

## 2016-08-03 DIAGNOSIS — R2232 Localized swelling, mass and lump, left upper limb: Secondary | ICD-10-CM | POA: Diagnosis not present

## 2016-08-03 DIAGNOSIS — M25532 Pain in left wrist: Secondary | ICD-10-CM | POA: Diagnosis not present

## 2016-08-04 DIAGNOSIS — F0281 Dementia in other diseases classified elsewhere with behavioral disturbance: Secondary | ICD-10-CM | POA: Diagnosis not present

## 2016-08-04 DIAGNOSIS — R54 Age-related physical debility: Secondary | ICD-10-CM | POA: Diagnosis not present

## 2016-08-04 DIAGNOSIS — J40 Bronchitis, not specified as acute or chronic: Secondary | ICD-10-CM | POA: Diagnosis not present

## 2016-08-04 DIAGNOSIS — I10 Essential (primary) hypertension: Secondary | ICD-10-CM | POA: Diagnosis not present

## 2016-08-04 DIAGNOSIS — Z79899 Other long term (current) drug therapy: Secondary | ICD-10-CM | POA: Diagnosis not present

## 2016-08-04 DIAGNOSIS — S63592D Other specified sprain of left wrist, subsequent encounter: Secondary | ICD-10-CM | POA: Diagnosis not present

## 2016-08-04 DIAGNOSIS — G301 Alzheimer's disease with late onset: Secondary | ICD-10-CM | POA: Diagnosis not present

## 2016-08-05 DIAGNOSIS — Z79899 Other long term (current) drug therapy: Secondary | ICD-10-CM | POA: Diagnosis not present

## 2016-08-11 DIAGNOSIS — I10 Essential (primary) hypertension: Secondary | ICD-10-CM | POA: Diagnosis not present

## 2016-08-11 DIAGNOSIS — E785 Hyperlipidemia, unspecified: Secondary | ICD-10-CM | POA: Diagnosis not present

## 2016-08-11 DIAGNOSIS — R54 Age-related physical debility: Secondary | ICD-10-CM | POA: Diagnosis not present

## 2016-08-11 DIAGNOSIS — N289 Disorder of kidney and ureter, unspecified: Secondary | ICD-10-CM | POA: Diagnosis not present

## 2016-08-11 DIAGNOSIS — S63592D Other specified sprain of left wrist, subsequent encounter: Secondary | ICD-10-CM | POA: Diagnosis not present

## 2016-08-11 DIAGNOSIS — Z79899 Other long term (current) drug therapy: Secondary | ICD-10-CM | POA: Diagnosis not present

## 2016-08-11 DIAGNOSIS — J40 Bronchitis, not specified as acute or chronic: Secondary | ICD-10-CM | POA: Diagnosis not present

## 2016-08-27 DIAGNOSIS — E785 Hyperlipidemia, unspecified: Secondary | ICD-10-CM | POA: Diagnosis not present

## 2016-08-27 DIAGNOSIS — I10 Essential (primary) hypertension: Secondary | ICD-10-CM | POA: Diagnosis not present

## 2016-08-27 DIAGNOSIS — M1712 Unilateral primary osteoarthritis, left knee: Secondary | ICD-10-CM | POA: Diagnosis not present

## 2016-08-27 DIAGNOSIS — M25531 Pain in right wrist: Secondary | ICD-10-CM | POA: Diagnosis not present

## 2016-08-27 DIAGNOSIS — Z79899 Other long term (current) drug therapy: Secondary | ICD-10-CM | POA: Diagnosis not present

## 2016-09-08 DIAGNOSIS — I10 Essential (primary) hypertension: Secondary | ICD-10-CM | POA: Diagnosis not present

## 2016-09-08 DIAGNOSIS — E785 Hyperlipidemia, unspecified: Secondary | ICD-10-CM | POA: Diagnosis not present

## 2016-09-08 DIAGNOSIS — M1712 Unilateral primary osteoarthritis, left knee: Secondary | ICD-10-CM | POA: Diagnosis not present

## 2016-09-08 DIAGNOSIS — R413 Other amnesia: Secondary | ICD-10-CM | POA: Diagnosis not present

## 2016-09-08 DIAGNOSIS — R2689 Other abnormalities of gait and mobility: Secondary | ICD-10-CM | POA: Diagnosis not present

## 2016-09-08 DIAGNOSIS — J449 Chronic obstructive pulmonary disease, unspecified: Secondary | ICD-10-CM | POA: Diagnosis not present

## 2016-09-08 DIAGNOSIS — F0391 Unspecified dementia with behavioral disturbance: Secondary | ICD-10-CM | POA: Diagnosis not present

## 2016-09-08 DIAGNOSIS — M25862 Other specified joint disorders, left knee: Secondary | ICD-10-CM | POA: Diagnosis not present

## 2016-09-08 DIAGNOSIS — Z79899 Other long term (current) drug therapy: Secondary | ICD-10-CM | POA: Diagnosis not present

## 2016-09-08 DIAGNOSIS — F321 Major depressive disorder, single episode, moderate: Secondary | ICD-10-CM | POA: Diagnosis not present

## 2016-09-09 DIAGNOSIS — Z79899 Other long term (current) drug therapy: Secondary | ICD-10-CM | POA: Diagnosis not present

## 2016-09-10 DIAGNOSIS — I10 Essential (primary) hypertension: Secondary | ICD-10-CM | POA: Diagnosis not present

## 2016-09-10 DIAGNOSIS — F0280 Dementia in other diseases classified elsewhere without behavioral disturbance: Secondary | ICD-10-CM | POA: Diagnosis not present

## 2016-09-10 DIAGNOSIS — I2510 Atherosclerotic heart disease of native coronary artery without angina pectoris: Secondary | ICD-10-CM | POA: Diagnosis not present

## 2016-09-10 DIAGNOSIS — G300 Alzheimer's disease with early onset: Secondary | ICD-10-CM | POA: Diagnosis not present

## 2016-09-10 DIAGNOSIS — I252 Old myocardial infarction: Secondary | ICD-10-CM | POA: Diagnosis not present

## 2016-09-10 DIAGNOSIS — M1712 Unilateral primary osteoarthritis, left knee: Secondary | ICD-10-CM | POA: Diagnosis not present

## 2016-09-15 DIAGNOSIS — G300 Alzheimer's disease with early onset: Secondary | ICD-10-CM | POA: Diagnosis not present

## 2016-09-15 DIAGNOSIS — F0280 Dementia in other diseases classified elsewhere without behavioral disturbance: Secondary | ICD-10-CM | POA: Diagnosis not present

## 2016-09-15 DIAGNOSIS — I252 Old myocardial infarction: Secondary | ICD-10-CM | POA: Diagnosis not present

## 2016-09-15 DIAGNOSIS — I10 Essential (primary) hypertension: Secondary | ICD-10-CM | POA: Diagnosis not present

## 2016-09-15 DIAGNOSIS — E785 Hyperlipidemia, unspecified: Secondary | ICD-10-CM | POA: Diagnosis not present

## 2016-09-15 DIAGNOSIS — N289 Disorder of kidney and ureter, unspecified: Secondary | ICD-10-CM | POA: Diagnosis not present

## 2016-09-15 DIAGNOSIS — G309 Alzheimer's disease, unspecified: Secondary | ICD-10-CM | POA: Diagnosis not present

## 2016-09-15 DIAGNOSIS — M1712 Unilateral primary osteoarthritis, left knee: Secondary | ICD-10-CM | POA: Diagnosis not present

## 2016-09-15 DIAGNOSIS — Z79899 Other long term (current) drug therapy: Secondary | ICD-10-CM | POA: Diagnosis not present

## 2016-09-15 DIAGNOSIS — R739 Hyperglycemia, unspecified: Secondary | ICD-10-CM | POA: Diagnosis not present

## 2016-09-15 DIAGNOSIS — I2510 Atherosclerotic heart disease of native coronary artery without angina pectoris: Secondary | ICD-10-CM | POA: Diagnosis not present

## 2016-09-15 DIAGNOSIS — M25562 Pain in left knee: Secondary | ICD-10-CM | POA: Diagnosis not present

## 2016-09-16 DIAGNOSIS — M1712 Unilateral primary osteoarthritis, left knee: Secondary | ICD-10-CM | POA: Diagnosis not present

## 2016-09-16 DIAGNOSIS — I252 Old myocardial infarction: Secondary | ICD-10-CM | POA: Diagnosis not present

## 2016-09-16 DIAGNOSIS — G300 Alzheimer's disease with early onset: Secondary | ICD-10-CM | POA: Diagnosis not present

## 2016-09-16 DIAGNOSIS — I2510 Atherosclerotic heart disease of native coronary artery without angina pectoris: Secondary | ICD-10-CM | POA: Diagnosis not present

## 2016-09-16 DIAGNOSIS — I10 Essential (primary) hypertension: Secondary | ICD-10-CM | POA: Diagnosis not present

## 2016-09-16 DIAGNOSIS — F0280 Dementia in other diseases classified elsewhere without behavioral disturbance: Secondary | ICD-10-CM | POA: Diagnosis not present

## 2016-09-19 DIAGNOSIS — I2510 Atherosclerotic heart disease of native coronary artery without angina pectoris: Secondary | ICD-10-CM | POA: Diagnosis not present

## 2016-09-19 DIAGNOSIS — M1712 Unilateral primary osteoarthritis, left knee: Secondary | ICD-10-CM | POA: Diagnosis not present

## 2016-09-19 DIAGNOSIS — F0280 Dementia in other diseases classified elsewhere without behavioral disturbance: Secondary | ICD-10-CM | POA: Diagnosis not present

## 2016-09-19 DIAGNOSIS — I252 Old myocardial infarction: Secondary | ICD-10-CM | POA: Diagnosis not present

## 2016-09-19 DIAGNOSIS — I10 Essential (primary) hypertension: Secondary | ICD-10-CM | POA: Diagnosis not present

## 2016-09-19 DIAGNOSIS — G300 Alzheimer's disease with early onset: Secondary | ICD-10-CM | POA: Diagnosis not present

## 2016-09-22 DIAGNOSIS — I10 Essential (primary) hypertension: Secondary | ICD-10-CM | POA: Diagnosis not present

## 2016-09-22 DIAGNOSIS — I252 Old myocardial infarction: Secondary | ICD-10-CM | POA: Diagnosis not present

## 2016-09-22 DIAGNOSIS — G300 Alzheimer's disease with early onset: Secondary | ICD-10-CM | POA: Diagnosis not present

## 2016-09-22 DIAGNOSIS — M1712 Unilateral primary osteoarthritis, left knee: Secondary | ICD-10-CM | POA: Diagnosis not present

## 2016-09-22 DIAGNOSIS — I2510 Atherosclerotic heart disease of native coronary artery without angina pectoris: Secondary | ICD-10-CM | POA: Diagnosis not present

## 2016-09-22 DIAGNOSIS — F0280 Dementia in other diseases classified elsewhere without behavioral disturbance: Secondary | ICD-10-CM | POA: Diagnosis not present

## 2016-09-24 DIAGNOSIS — M1712 Unilateral primary osteoarthritis, left knee: Secondary | ICD-10-CM | POA: Diagnosis not present

## 2016-09-24 DIAGNOSIS — I10 Essential (primary) hypertension: Secondary | ICD-10-CM | POA: Diagnosis not present

## 2016-09-24 DIAGNOSIS — I252 Old myocardial infarction: Secondary | ICD-10-CM | POA: Diagnosis not present

## 2016-09-24 DIAGNOSIS — F0280 Dementia in other diseases classified elsewhere without behavioral disturbance: Secondary | ICD-10-CM | POA: Diagnosis not present

## 2016-09-24 DIAGNOSIS — G300 Alzheimer's disease with early onset: Secondary | ICD-10-CM | POA: Diagnosis not present

## 2016-09-24 DIAGNOSIS — I2510 Atherosclerotic heart disease of native coronary artery without angina pectoris: Secondary | ICD-10-CM | POA: Diagnosis not present

## 2016-09-26 DIAGNOSIS — F0281 Dementia in other diseases classified elsewhere with behavioral disturbance: Secondary | ICD-10-CM | POA: Diagnosis not present

## 2016-09-26 DIAGNOSIS — I10 Essential (primary) hypertension: Secondary | ICD-10-CM | POA: Diagnosis not present

## 2016-09-26 DIAGNOSIS — E785 Hyperlipidemia, unspecified: Secondary | ICD-10-CM | POA: Diagnosis not present

## 2016-09-26 DIAGNOSIS — I2510 Atherosclerotic heart disease of native coronary artery without angina pectoris: Secondary | ICD-10-CM | POA: Diagnosis not present

## 2016-09-26 DIAGNOSIS — R05 Cough: Secondary | ICD-10-CM | POA: Diagnosis not present

## 2016-09-30 DIAGNOSIS — I252 Old myocardial infarction: Secondary | ICD-10-CM | POA: Diagnosis not present

## 2016-09-30 DIAGNOSIS — F0280 Dementia in other diseases classified elsewhere without behavioral disturbance: Secondary | ICD-10-CM | POA: Diagnosis not present

## 2016-09-30 DIAGNOSIS — I10 Essential (primary) hypertension: Secondary | ICD-10-CM | POA: Diagnosis not present

## 2016-09-30 DIAGNOSIS — I2510 Atherosclerotic heart disease of native coronary artery without angina pectoris: Secondary | ICD-10-CM | POA: Diagnosis not present

## 2016-09-30 DIAGNOSIS — G300 Alzheimer's disease with early onset: Secondary | ICD-10-CM | POA: Diagnosis not present

## 2016-09-30 DIAGNOSIS — M1712 Unilateral primary osteoarthritis, left knee: Secondary | ICD-10-CM | POA: Diagnosis not present

## 2016-10-03 DIAGNOSIS — I252 Old myocardial infarction: Secondary | ICD-10-CM | POA: Diagnosis not present

## 2016-10-03 DIAGNOSIS — F0280 Dementia in other diseases classified elsewhere without behavioral disturbance: Secondary | ICD-10-CM | POA: Diagnosis not present

## 2016-10-03 DIAGNOSIS — G300 Alzheimer's disease with early onset: Secondary | ICD-10-CM | POA: Diagnosis not present

## 2016-10-03 DIAGNOSIS — M1712 Unilateral primary osteoarthritis, left knee: Secondary | ICD-10-CM | POA: Diagnosis not present

## 2016-10-03 DIAGNOSIS — I10 Essential (primary) hypertension: Secondary | ICD-10-CM | POA: Diagnosis not present

## 2016-10-03 DIAGNOSIS — I2510 Atherosclerotic heart disease of native coronary artery without angina pectoris: Secondary | ICD-10-CM | POA: Diagnosis not present

## 2016-10-07 DIAGNOSIS — M1712 Unilateral primary osteoarthritis, left knee: Secondary | ICD-10-CM | POA: Diagnosis not present

## 2016-10-07 DIAGNOSIS — I10 Essential (primary) hypertension: Secondary | ICD-10-CM | POA: Diagnosis not present

## 2016-10-07 DIAGNOSIS — F0280 Dementia in other diseases classified elsewhere without behavioral disturbance: Secondary | ICD-10-CM | POA: Diagnosis not present

## 2016-10-07 DIAGNOSIS — G300 Alzheimer's disease with early onset: Secondary | ICD-10-CM | POA: Diagnosis not present

## 2016-10-07 DIAGNOSIS — I2510 Atherosclerotic heart disease of native coronary artery without angina pectoris: Secondary | ICD-10-CM | POA: Diagnosis not present

## 2016-10-07 DIAGNOSIS — I252 Old myocardial infarction: Secondary | ICD-10-CM | POA: Diagnosis not present

## 2016-10-08 DIAGNOSIS — F0280 Dementia in other diseases classified elsewhere without behavioral disturbance: Secondary | ICD-10-CM | POA: Diagnosis not present

## 2016-10-08 DIAGNOSIS — G300 Alzheimer's disease with early onset: Secondary | ICD-10-CM | POA: Diagnosis not present

## 2016-10-08 DIAGNOSIS — I252 Old myocardial infarction: Secondary | ICD-10-CM | POA: Diagnosis not present

## 2016-10-08 DIAGNOSIS — M1712 Unilateral primary osteoarthritis, left knee: Secondary | ICD-10-CM | POA: Diagnosis not present

## 2016-10-08 DIAGNOSIS — I2510 Atherosclerotic heart disease of native coronary artery without angina pectoris: Secondary | ICD-10-CM | POA: Diagnosis not present

## 2016-10-08 DIAGNOSIS — I10 Essential (primary) hypertension: Secondary | ICD-10-CM | POA: Diagnosis not present

## 2016-10-09 DIAGNOSIS — F0280 Dementia in other diseases classified elsewhere without behavioral disturbance: Secondary | ICD-10-CM | POA: Diagnosis not present

## 2016-10-09 DIAGNOSIS — G300 Alzheimer's disease with early onset: Secondary | ICD-10-CM | POA: Diagnosis not present

## 2016-10-09 DIAGNOSIS — I252 Old myocardial infarction: Secondary | ICD-10-CM | POA: Diagnosis not present

## 2016-10-09 DIAGNOSIS — I10 Essential (primary) hypertension: Secondary | ICD-10-CM | POA: Diagnosis not present

## 2016-10-09 DIAGNOSIS — M1712 Unilateral primary osteoarthritis, left knee: Secondary | ICD-10-CM | POA: Diagnosis not present

## 2016-10-09 DIAGNOSIS — I2510 Atherosclerotic heart disease of native coronary artery without angina pectoris: Secondary | ICD-10-CM | POA: Diagnosis not present

## 2016-10-15 DIAGNOSIS — I2510 Atherosclerotic heart disease of native coronary artery without angina pectoris: Secondary | ICD-10-CM | POA: Diagnosis not present

## 2016-10-15 DIAGNOSIS — I252 Old myocardial infarction: Secondary | ICD-10-CM | POA: Diagnosis not present

## 2016-10-15 DIAGNOSIS — I10 Essential (primary) hypertension: Secondary | ICD-10-CM | POA: Diagnosis not present

## 2016-10-15 DIAGNOSIS — F0280 Dementia in other diseases classified elsewhere without behavioral disturbance: Secondary | ICD-10-CM | POA: Diagnosis not present

## 2016-10-15 DIAGNOSIS — G300 Alzheimer's disease with early onset: Secondary | ICD-10-CM | POA: Diagnosis not present

## 2016-10-15 DIAGNOSIS — M1712 Unilateral primary osteoarthritis, left knee: Secondary | ICD-10-CM | POA: Diagnosis not present

## 2016-10-17 DIAGNOSIS — I252 Old myocardial infarction: Secondary | ICD-10-CM | POA: Diagnosis not present

## 2016-10-17 DIAGNOSIS — M1712 Unilateral primary osteoarthritis, left knee: Secondary | ICD-10-CM | POA: Diagnosis not present

## 2016-10-17 DIAGNOSIS — G300 Alzheimer's disease with early onset: Secondary | ICD-10-CM | POA: Diagnosis not present

## 2016-10-17 DIAGNOSIS — I2510 Atherosclerotic heart disease of native coronary artery without angina pectoris: Secondary | ICD-10-CM | POA: Diagnosis not present

## 2016-10-17 DIAGNOSIS — I10 Essential (primary) hypertension: Secondary | ICD-10-CM | POA: Diagnosis not present

## 2016-10-17 DIAGNOSIS — F0280 Dementia in other diseases classified elsewhere without behavioral disturbance: Secondary | ICD-10-CM | POA: Diagnosis not present

## 2016-10-22 DIAGNOSIS — F0280 Dementia in other diseases classified elsewhere without behavioral disturbance: Secondary | ICD-10-CM | POA: Diagnosis not present

## 2016-10-22 DIAGNOSIS — G300 Alzheimer's disease with early onset: Secondary | ICD-10-CM | POA: Diagnosis not present

## 2016-10-22 DIAGNOSIS — I2510 Atherosclerotic heart disease of native coronary artery without angina pectoris: Secondary | ICD-10-CM | POA: Diagnosis not present

## 2016-10-22 DIAGNOSIS — I10 Essential (primary) hypertension: Secondary | ICD-10-CM | POA: Diagnosis not present

## 2016-10-22 DIAGNOSIS — M1712 Unilateral primary osteoarthritis, left knee: Secondary | ICD-10-CM | POA: Diagnosis not present

## 2016-10-22 DIAGNOSIS — I252 Old myocardial infarction: Secondary | ICD-10-CM | POA: Diagnosis not present

## 2016-10-24 DIAGNOSIS — M1712 Unilateral primary osteoarthritis, left knee: Secondary | ICD-10-CM | POA: Diagnosis not present

## 2016-10-24 DIAGNOSIS — I10 Essential (primary) hypertension: Secondary | ICD-10-CM | POA: Diagnosis not present

## 2016-10-24 DIAGNOSIS — I2510 Atherosclerotic heart disease of native coronary artery without angina pectoris: Secondary | ICD-10-CM | POA: Diagnosis not present

## 2016-10-24 DIAGNOSIS — F0280 Dementia in other diseases classified elsewhere without behavioral disturbance: Secondary | ICD-10-CM | POA: Diagnosis not present

## 2016-10-24 DIAGNOSIS — I252 Old myocardial infarction: Secondary | ICD-10-CM | POA: Diagnosis not present

## 2016-10-24 DIAGNOSIS — G300 Alzheimer's disease with early onset: Secondary | ICD-10-CM | POA: Diagnosis not present

## 2016-10-27 DIAGNOSIS — I10 Essential (primary) hypertension: Secondary | ICD-10-CM | POA: Diagnosis not present

## 2016-10-27 DIAGNOSIS — I252 Old myocardial infarction: Secondary | ICD-10-CM | POA: Diagnosis not present

## 2016-10-27 DIAGNOSIS — G300 Alzheimer's disease with early onset: Secondary | ICD-10-CM | POA: Diagnosis not present

## 2016-10-27 DIAGNOSIS — M1712 Unilateral primary osteoarthritis, left knee: Secondary | ICD-10-CM | POA: Diagnosis not present

## 2016-10-27 DIAGNOSIS — F0280 Dementia in other diseases classified elsewhere without behavioral disturbance: Secondary | ICD-10-CM | POA: Diagnosis not present

## 2016-10-27 DIAGNOSIS — I2510 Atherosclerotic heart disease of native coronary artery without angina pectoris: Secondary | ICD-10-CM | POA: Diagnosis not present

## 2016-10-31 DIAGNOSIS — I10 Essential (primary) hypertension: Secondary | ICD-10-CM | POA: Diagnosis not present

## 2016-10-31 DIAGNOSIS — M1712 Unilateral primary osteoarthritis, left knee: Secondary | ICD-10-CM | POA: Diagnosis not present

## 2016-10-31 DIAGNOSIS — F0280 Dementia in other diseases classified elsewhere without behavioral disturbance: Secondary | ICD-10-CM | POA: Diagnosis not present

## 2016-10-31 DIAGNOSIS — I252 Old myocardial infarction: Secondary | ICD-10-CM | POA: Diagnosis not present

## 2016-10-31 DIAGNOSIS — G300 Alzheimer's disease with early onset: Secondary | ICD-10-CM | POA: Diagnosis not present

## 2016-10-31 DIAGNOSIS — I2510 Atherosclerotic heart disease of native coronary artery without angina pectoris: Secondary | ICD-10-CM | POA: Diagnosis not present

## 2016-11-03 DIAGNOSIS — G300 Alzheimer's disease with early onset: Secondary | ICD-10-CM | POA: Diagnosis not present

## 2016-11-03 DIAGNOSIS — M1712 Unilateral primary osteoarthritis, left knee: Secondary | ICD-10-CM | POA: Diagnosis not present

## 2016-11-03 DIAGNOSIS — I252 Old myocardial infarction: Secondary | ICD-10-CM | POA: Diagnosis not present

## 2016-11-03 DIAGNOSIS — I10 Essential (primary) hypertension: Secondary | ICD-10-CM | POA: Diagnosis not present

## 2016-11-03 DIAGNOSIS — F0280 Dementia in other diseases classified elsewhere without behavioral disturbance: Secondary | ICD-10-CM | POA: Diagnosis not present

## 2016-11-03 DIAGNOSIS — I2510 Atherosclerotic heart disease of native coronary artery without angina pectoris: Secondary | ICD-10-CM | POA: Diagnosis not present

## 2016-11-06 DIAGNOSIS — I2510 Atherosclerotic heart disease of native coronary artery without angina pectoris: Secondary | ICD-10-CM | POA: Diagnosis not present

## 2016-11-06 DIAGNOSIS — I10 Essential (primary) hypertension: Secondary | ICD-10-CM | POA: Diagnosis not present

## 2016-11-06 DIAGNOSIS — G300 Alzheimer's disease with early onset: Secondary | ICD-10-CM | POA: Diagnosis not present

## 2016-11-06 DIAGNOSIS — M1712 Unilateral primary osteoarthritis, left knee: Secondary | ICD-10-CM | POA: Diagnosis not present

## 2016-11-06 DIAGNOSIS — F0280 Dementia in other diseases classified elsewhere without behavioral disturbance: Secondary | ICD-10-CM | POA: Diagnosis not present

## 2016-11-06 DIAGNOSIS — I252 Old myocardial infarction: Secondary | ICD-10-CM | POA: Diagnosis not present

## 2016-11-10 DIAGNOSIS — F0391 Unspecified dementia with behavioral disturbance: Secondary | ICD-10-CM | POA: Diagnosis not present

## 2016-11-10 DIAGNOSIS — F321 Major depressive disorder, single episode, moderate: Secondary | ICD-10-CM | POA: Diagnosis not present

## 2016-11-12 DIAGNOSIS — H269 Unspecified cataract: Secondary | ICD-10-CM | POA: Diagnosis not present

## 2016-11-12 DIAGNOSIS — H02102 Unspecified ectropion of right lower eyelid: Secondary | ICD-10-CM | POA: Diagnosis not present

## 2016-11-12 DIAGNOSIS — H35111 Retinopathy of prematurity, stage 0, right eye: Secondary | ICD-10-CM | POA: Diagnosis not present

## 2016-11-20 DIAGNOSIS — M2010 Hallux valgus (acquired), unspecified foot: Secondary | ICD-10-CM | POA: Diagnosis not present

## 2016-11-20 DIAGNOSIS — I739 Peripheral vascular disease, unspecified: Secondary | ICD-10-CM | POA: Diagnosis not present

## 2016-11-20 DIAGNOSIS — B351 Tinea unguium: Secondary | ICD-10-CM | POA: Diagnosis not present

## 2016-11-20 DIAGNOSIS — L84 Corns and callosities: Secondary | ICD-10-CM | POA: Diagnosis not present

## 2016-12-09 DIAGNOSIS — Z79899 Other long term (current) drug therapy: Secondary | ICD-10-CM | POA: Diagnosis not present

## 2016-12-25 DIAGNOSIS — E785 Hyperlipidemia, unspecified: Secondary | ICD-10-CM | POA: Diagnosis not present

## 2016-12-25 DIAGNOSIS — Z79899 Other long term (current) drug therapy: Secondary | ICD-10-CM | POA: Diagnosis not present

## 2016-12-25 DIAGNOSIS — M25562 Pain in left knee: Secondary | ICD-10-CM | POA: Diagnosis not present

## 2016-12-25 DIAGNOSIS — N289 Disorder of kidney and ureter, unspecified: Secondary | ICD-10-CM | POA: Diagnosis not present

## 2016-12-25 DIAGNOSIS — R739 Hyperglycemia, unspecified: Secondary | ICD-10-CM | POA: Diagnosis not present

## 2016-12-25 DIAGNOSIS — G309 Alzheimer's disease, unspecified: Secondary | ICD-10-CM | POA: Diagnosis not present

## 2016-12-25 DIAGNOSIS — M1712 Unilateral primary osteoarthritis, left knee: Secondary | ICD-10-CM | POA: Diagnosis not present

## 2016-12-25 DIAGNOSIS — I10 Essential (primary) hypertension: Secondary | ICD-10-CM | POA: Diagnosis not present

## 2016-12-30 DIAGNOSIS — Z79899 Other long term (current) drug therapy: Secondary | ICD-10-CM | POA: Diagnosis not present

## 2017-01-27 DIAGNOSIS — L84 Corns and callosities: Secondary | ICD-10-CM | POA: Diagnosis not present

## 2017-01-27 DIAGNOSIS — B351 Tinea unguium: Secondary | ICD-10-CM | POA: Diagnosis not present

## 2017-01-27 DIAGNOSIS — I739 Peripheral vascular disease, unspecified: Secondary | ICD-10-CM | POA: Diagnosis not present

## 2017-01-27 DIAGNOSIS — M2010 Hallux valgus (acquired), unspecified foot: Secondary | ICD-10-CM | POA: Diagnosis not present

## 2017-02-06 DIAGNOSIS — F321 Major depressive disorder, single episode, moderate: Secondary | ICD-10-CM | POA: Diagnosis not present

## 2017-02-06 DIAGNOSIS — F0391 Unspecified dementia with behavioral disturbance: Secondary | ICD-10-CM | POA: Diagnosis not present

## 2017-03-13 DIAGNOSIS — F321 Major depressive disorder, single episode, moderate: Secondary | ICD-10-CM | POA: Diagnosis not present

## 2017-03-13 DIAGNOSIS — F0391 Unspecified dementia with behavioral disturbance: Secondary | ICD-10-CM | POA: Diagnosis not present

## 2017-03-31 DIAGNOSIS — B351 Tinea unguium: Secondary | ICD-10-CM | POA: Diagnosis not present

## 2017-03-31 DIAGNOSIS — I739 Peripheral vascular disease, unspecified: Secondary | ICD-10-CM | POA: Diagnosis not present

## 2017-03-31 DIAGNOSIS — M2010 Hallux valgus (acquired), unspecified foot: Secondary | ICD-10-CM | POA: Diagnosis not present

## 2017-03-31 DIAGNOSIS — R262 Difficulty in walking, not elsewhere classified: Secondary | ICD-10-CM | POA: Diagnosis not present

## 2017-04-09 DIAGNOSIS — Z79899 Other long term (current) drug therapy: Secondary | ICD-10-CM | POA: Diagnosis not present

## 2017-04-16 DIAGNOSIS — G309 Alzheimer's disease, unspecified: Secondary | ICD-10-CM | POA: Diagnosis not present

## 2017-04-16 DIAGNOSIS — I10 Essential (primary) hypertension: Secondary | ICD-10-CM | POA: Diagnosis not present

## 2017-04-16 DIAGNOSIS — Z79899 Other long term (current) drug therapy: Secondary | ICD-10-CM | POA: Diagnosis not present

## 2017-04-16 DIAGNOSIS — M1712 Unilateral primary osteoarthritis, left knee: Secondary | ICD-10-CM | POA: Diagnosis not present

## 2017-04-16 DIAGNOSIS — N289 Disorder of kidney and ureter, unspecified: Secondary | ICD-10-CM | POA: Diagnosis not present

## 2017-04-16 DIAGNOSIS — M25562 Pain in left knee: Secondary | ICD-10-CM | POA: Diagnosis not present

## 2017-04-16 DIAGNOSIS — E785 Hyperlipidemia, unspecified: Secondary | ICD-10-CM | POA: Diagnosis not present

## 2017-04-16 DIAGNOSIS — R739 Hyperglycemia, unspecified: Secondary | ICD-10-CM | POA: Diagnosis not present

## 2017-05-07 DIAGNOSIS — E785 Hyperlipidemia, unspecified: Secondary | ICD-10-CM | POA: Diagnosis not present

## 2017-05-07 DIAGNOSIS — M25562 Pain in left knee: Secondary | ICD-10-CM | POA: Diagnosis not present

## 2017-05-07 DIAGNOSIS — M1712 Unilateral primary osteoarthritis, left knee: Secondary | ICD-10-CM | POA: Diagnosis not present

## 2017-05-07 DIAGNOSIS — G309 Alzheimer's disease, unspecified: Secondary | ICD-10-CM | POA: Diagnosis not present

## 2017-05-07 DIAGNOSIS — Z79899 Other long term (current) drug therapy: Secondary | ICD-10-CM | POA: Diagnosis not present

## 2017-05-07 DIAGNOSIS — R739 Hyperglycemia, unspecified: Secondary | ICD-10-CM | POA: Diagnosis not present

## 2017-05-07 DIAGNOSIS — N289 Disorder of kidney and ureter, unspecified: Secondary | ICD-10-CM | POA: Diagnosis not present

## 2017-05-07 DIAGNOSIS — I10 Essential (primary) hypertension: Secondary | ICD-10-CM | POA: Diagnosis not present

## 2017-05-09 ENCOUNTER — Emergency Department
Admission: EM | Admit: 2017-05-09 | Discharge: 2017-05-09 | Disposition: A | Discharge status: Skilled Nursing Facility | Payer: Medicare Other | Attending: Emergency Medicine | Admitting: Emergency Medicine

## 2017-05-09 ENCOUNTER — Emergency Department: Payer: Medicare Other

## 2017-05-09 DIAGNOSIS — Z951 Presence of aortocoronary bypass graft: Secondary | ICD-10-CM | POA: Diagnosis not present

## 2017-05-09 DIAGNOSIS — M6281 Muscle weakness (generalized): Secondary | ICD-10-CM | POA: Diagnosis not present

## 2017-05-09 DIAGNOSIS — G300 Alzheimer's disease with early onset: Secondary | ICD-10-CM | POA: Insufficient documentation

## 2017-05-09 DIAGNOSIS — Z79899 Other long term (current) drug therapy: Secondary | ICD-10-CM | POA: Insufficient documentation

## 2017-05-09 DIAGNOSIS — I2510 Atherosclerotic heart disease of native coronary artery without angina pectoris: Secondary | ICD-10-CM | POA: Insufficient documentation

## 2017-05-09 DIAGNOSIS — R079 Chest pain, unspecified: Secondary | ICD-10-CM | POA: Diagnosis not present

## 2017-05-09 DIAGNOSIS — R110 Nausea: Secondary | ICD-10-CM | POA: Diagnosis not present

## 2017-05-09 DIAGNOSIS — F17210 Nicotine dependence, cigarettes, uncomplicated: Secondary | ICD-10-CM | POA: Insufficient documentation

## 2017-05-09 DIAGNOSIS — Z7401 Bed confinement status: Secondary | ICD-10-CM | POA: Diagnosis not present

## 2017-05-09 DIAGNOSIS — I10 Essential (primary) hypertension: Secondary | ICD-10-CM | POA: Diagnosis not present

## 2017-05-09 LAB — CBC WITH DIFFERENTIAL/PLATELET
BASOS ABS: 0 10*3/uL (ref 0–0.1)
BASOS PCT: 1 %
Eosinophils Absolute: 0.2 10*3/uL (ref 0–0.7)
Eosinophils Relative: 2 %
HCT: 36.6 % (ref 35.0–47.0)
HEMOGLOBIN: 12.6 g/dL (ref 12.0–16.0)
LYMPHS ABS: 1.8 10*3/uL (ref 1.0–3.6)
Lymphocytes Relative: 27 %
MCH: 35.4 pg — ABNORMAL HIGH (ref 26.0–34.0)
MCHC: 34.5 g/dL (ref 32.0–36.0)
MCV: 102.6 fL — ABNORMAL HIGH (ref 80.0–100.0)
Monocytes Absolute: 0.7 10*3/uL (ref 0.2–0.9)
Monocytes Relative: 10 %
NEUTROS PCT: 60 %
Neutro Abs: 4.1 10*3/uL (ref 1.4–6.5)
Platelets: 237 10*3/uL (ref 150–440)
RBC: 3.56 MIL/uL — AB (ref 3.80–5.20)
RDW: 13.4 % (ref 11.5–14.5)
WBC: 6.9 10*3/uL (ref 3.6–11.0)

## 2017-05-09 LAB — COMPREHENSIVE METABOLIC PANEL
ALK PHOS: 101 U/L (ref 38–126)
ALT: 19 U/L (ref 14–54)
ANION GAP: 8 (ref 5–15)
AST: 46 U/L — ABNORMAL HIGH (ref 15–41)
Albumin: 3.3 g/dL — ABNORMAL LOW (ref 3.5–5.0)
BUN: 24 mg/dL — ABNORMAL HIGH (ref 6–20)
CALCIUM: 8.9 mg/dL (ref 8.9–10.3)
CO2: 26 mmol/L (ref 22–32)
Chloride: 103 mmol/L (ref 101–111)
Creatinine, Ser: 1.57 mg/dL — ABNORMAL HIGH (ref 0.44–1.00)
GFR calc non Af Amer: 31 mL/min — ABNORMAL LOW (ref >60)
GFR, EST AFRICAN AMERICAN: 36 mL/min — AB (ref >60)
Glucose, Bld: 108 mg/dL — ABNORMAL HIGH (ref 65–99)
Potassium: 3.8 mmol/L (ref 3.5–5.1)
SODIUM: 137 mmol/L (ref 135–145)
Total Bilirubin: 0.6 mg/dL (ref 0.3–1.2)
Total Protein: 7.3 g/dL (ref 6.5–8.1)

## 2017-05-09 LAB — TROPONIN I

## 2017-05-09 NOTE — ED Notes (Signed)
Pt assisted to BR, pt used BR without difficulty and is resting back in bed

## 2017-05-09 NOTE — Discharge Instructions (Signed)
Fortunately today your blood work is reassuring and you did not have a heart attack.  Please do make an appointment to follow-up with your primary care physician this coming Monday for recheck.  Return to the emergency department sooner for any concerns.  It was a pleasure to take care of you today, and thank you for coming to our emergency department.  If you have any questions or concerns before leaving please ask the nurse to grab me and I'm more than happy to go through your aftercare instructions again.  If you were prescribed any opioid pain medication today such as Norco, Vicodin, Percocet, morphine, hydrocodone, or oxycodone please make sure you do not drive when you are taking this medication as it can alter your ability to drive safely.  If you have any concerns once you are home that you are not improving or are in fact getting worse before you can make it to your follow-up appointment, please do not hesitate to call [phone] and come back for further evaluation.  Merrily BrittleNeil Rayhan Groleau, MD  Results for orders placed or performed during the hospital encounter of 05/09/17  Comprehensive metabolic panel  Result Value Ref Range   Sodium 137 135 - 145 mmol/L   Potassium 3.8 3.5 - 5.1 mmol/L   Chloride 103 101 - 111 mmol/L   CO2 26 22 - 32 mmol/L   Glucose, Bld 108 (H) 65 - 99 mg/dL   BUN 24 (H) 6 - 20 mg/dL   Creatinine, Ser 1.611.57 (H) 0.44 - 1.00 mg/dL   Calcium 8.9 8.9 - 09.610.3 mg/dL   Total Protein 7.3 6.5 - 8.1 g/dL   Albumin 3.3 (L) 3.5 - 5.0 g/dL   AST 46 (H) 15 - 41 U/L   ALT 19 14 - 54 U/L   Alkaline Phosphatase 101 38 - 126 U/L   Total Bilirubin 0.6 0.3 - 1.2 mg/dL   GFR calc non Af Amer 31 (L) >60 mL/min   GFR calc Af Amer 36 (L) >60 mL/min   Anion gap 8 5 - 15  Troponin I  Result Value Ref Range   Troponin I <0.03 <0.03 ng/mL  CBC with Differential  Result Value Ref Range   WBC 6.9 3.6 - 11.0 K/uL   RBC 3.56 (L) 3.80 - 5.20 MIL/uL   Hemoglobin 12.6 12.0 - 16.0 g/dL   HCT 04.536.6  40.935.0 - 81.147.0 %   MCV 102.6 (H) 80.0 - 100.0 fL   MCH 35.4 (H) 26.0 - 34.0 pg   MCHC 34.5 32.0 - 36.0 g/dL   RDW 91.413.4 78.211.5 - 95.614.5 %   Platelets 237 150 - 440 K/uL   Neutrophils Relative % 60 %   Neutro Abs 4.1 1.4 - 6.5 K/uL   Lymphocytes Relative 27 %   Lymphs Abs 1.8 1.0 - 3.6 K/uL   Monocytes Relative 10 %   Monocytes Absolute 0.7 0.2 - 0.9 K/uL   Eosinophils Relative 2 %   Eosinophils Absolute 0.2 0 - 0.7 K/uL   Basophils Relative 1 %   Basophils Absolute 0.0 0 - 0.1 K/uL  Troponin I  Result Value Ref Range   Troponin I <0.03 <0.03 ng/mL   Dg Chest Port 1 View  Result Date: 05/09/2017 CLINICAL DATA:  76 year old female with chest pain. EXAM: PORTABLE CHEST 1 VIEW COMPARISON:  Chest radiograph dated 12/25/2015 FINDINGS: The lungs are clear. There is no pleural effusion or pneumothorax. The cardiac silhouette is within normal limits. Coronary vascular calcification or stent noted. There  is atherosclerotic calcification of the aortic arch. No acute osseous pathology. IMPRESSION: No active disease. Electronically Signed   By: Elgie Collard M.D.   On: 05/09/2017 00:43

## 2017-05-09 NOTE — ED Notes (Signed)
Pt sleeping at this time, equal and unlabored resp noted 

## 2017-05-09 NOTE — ED Notes (Signed)
Pt assisted to BR.

## 2017-05-09 NOTE — ED Triage Notes (Signed)
Per EMS, pt from [facility] and reports substernal CP that began around 2330 after taking a hydrocodone for her knee pain. EMS reports hx of 2 stents and CABG. Pt given 4 baby aspirin which pt states pain has resolved. Pt denies SHOB. Pt A&O and able to answer questions at this time.

## 2017-05-09 NOTE — ED Provider Notes (Signed)
[facility] Emergency Department Provider Note  ____________________________________________   First MD Initiated Contact with Patient 05/09/17 0011     (approximate)  I have reviewed the triage vital signs and the nursing notes.   HISTORY  Chief Complaint Chest Pain  Level 5 exemption history limited by the patient's dementia  HPI Tiffany Turner is a 76 y.o. female who comes to the emergency department via EMS with sudden onset moderate severity lower chest aching discomfort that awoke her from her sleep roughly 11:30 PM today.  She does have a past medical history of coronary artery disease and has had a two-vessel CABG.  EMS gave her 324 mg of aspirin in route.  The patient is currently in no pain.  She said the pain did make her nauseated.  It did not radiate.  It seemed to be worse when she sat up and slightly improved when lying flat.  She has had no leg swelling.  No history of DVT or pulmonary embolism.  The pain is not ripping or tearing and did not go straight to her back.  The patient has a family history of hypertension.  Past Medical History:  Diagnosis Date  . Coronary artery disease    stents placed  . Hip dislocation, right (HCC)    hx of dislocation  . Hyperlipemia   . Hypertension     Patient Active Problem List   Diagnosis Date Noted  . 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 Dementia, Alzheimer's, with behavior disturbance 02/16/2015  . Hypertension 02/16/2015    Past Surgical History:  Procedure Laterality Date  . CARDIAC SURGERY     stents  . CORONARY ANGIOPLASTY WITH STENT PLACEMENT      Prior to Admission medications   Medication Sig Start Date End Date Taking? Authorizing Provider  aspirin 81 MG chewable tablet Chew 81 mg by mouth daily.   Yes [provider]  atorvastatin (LIPITOR) 40 MG tablet Take 40 mg by mouth daily.   Yes [provider]  enalapril  (VASOTEC) 20 MG tablet Take 20 mg by mouth at bedtime.    Yes [provider]  fluticasone (FLONASE) 50 MCG/ACT nasal spray Place 2 sprays into both nostrils every 12 (twelve) hours.    Yes [provider]  HYDROcodone-acetaminophen (NORCO/VICODIN) 5-325 MG tablet Take 1 tablet by mouth 2 (two) times daily.   Yes [provider]  ibuprofen (ADVIL,MOTRIN) 400 MG tablet Take 400 mg by mouth 2 (two) times daily.   Yes [provider]  magnesium hydroxide (MILK OF MAGNESIA) 400 MG/5ML suspension Take 30 mLs by mouth daily as needed for mild constipation.   Yes [provider]  metoprolol succinate (TOPROL-XL) 50 MG 24 hr tablet Take 50 mg by mouth daily. Take with or immediately following a meal.   Yes [provider]  risperiDONE (RISPERDAL) 1 MG/ML oral solution Take 0.5 mg by mouth 2 (two) times daily.   Yes [provider]  rivastigmine (EXELON) 1.5 MG capsule Take 1.5 mg by mouth 2 (two) times daily.   Yes [provider]  traZODone (DESYREL) 50 MG tablet Take 50 mg by mouth at bedtime.   Yes [provider]  Valproate Sodium (DEPAKENE) 250 MG/5ML SOLN solution Take 250 mg by mouth 2 (two) times daily.   Yes [provider]  Artificial Tear Ointment (WH PETROL-MINERAL OIL-LANOLIN) 0.1-0.1 % OINT Place 1 drop into the right eye at bedtime.  01/01/16   [provider]  risperiDONE (RISPERIDONE M-TAB) 0.5 MG disintegrating tablet Take 1 tablet (0.5 mg total) by mouth 2 (two) times daily. Patient not taking: Reported on 05/09/2017 05/09/16   Clapacs, Jackquline Denmark, MD    Allergies Penicillins and Sulfa antibiotics  No family history on file.  Social History Social History  Substance Use Topics  . Smoking status: Current Every Day Smoker    Packs/day: 0.50    Types: Cigarettes  . Smokeless tobacco: Never Used  . Alcohol use No    Review of Systems Level 5 exemption history limited by the patient's  dementia ____________________________________________   PHYSICAL EXAM:  VITAL SIGNS: ED Triage Vitals  Enc Vitals Group     BP      Pulse      Resp      Temp      Temp src      SpO2      Weight      Height      Head Circumference      Peak Flow      Pain Score      Pain Loc      Pain Edu?      Excl. in GC?     Constitutional: Pleasant cooperative no acute distress Eyes: PERRL EOMI. significant lacrimation from right eye Head: Atraumatic. Nose: No congestion/rhinnorhea. Mouth/Throat: No trismus Neck: No stridor.   Cardiovascular: Normal rate, regular rhythm. Grossly normal heart sounds.  Good peripheral circulation. Respiratory: Normal respiratory effort.  No retractions. Lungs CTAB and moving good air Gastrointestinal: Soft nontender Musculoskeletal: No lower extremity edema   Neurologic:   No gross focal neurologic deficits are appreciated. Skin:  Skin is warm, dry and intact. No rash noted. Psychiatric: Slow methodical speech with significant dementia    ____________________________________________   DIFFERENTIAL includes but not limited to  Acute coronary syndrome, pulmonary embolism, aortic dissection, pneumothorax ____________________________________________   LABS (all labs ordered are listed, but only abnormal results are displayed)  Labs Reviewed  COMPREHENSIVE METABOLIC PANEL - Abnormal; Notable for the following:       Result Value   Glucose, Bld 108 (*)    BUN 24 (*)    Creatinine, Ser 1.57 (*)    Albumin 3.3 (*)    AST 46 (*)    GFR calc non Af Amer 31 (*)    GFR calc Af Amer 36 (*)    All other components within normal limits  CBC WITH DIFFERENTIAL/PLATELET - Abnormal; Notable for the following:    RBC 3.56 (*)    MCV 102.6 (*)    MCH 35.4 (*)    All other components within normal limits  TROPONIN I  TROPONIN I    Blood work reviewed and interpreted by me shows no acute ischemia  x2 __________________________________________  EKG  ED ECG REPORT I, Merrily Brittle, the attending physician, personally viewed and interpreted this ECG.  Date: 05/09/2017 EKG Time:  Rate: 63 Rhythm: normal sinus rhythm QRS Axis: normal Intervals: normal ST/T Wave abnormalities: normal Narrative Interpretation: no evidence of acute ischemia ______________________________________  RADIOLOGY  Chest x-ray reviewed by me with no acute disease ________________________________________   PROCEDURES  Procedure(s) performed: no  Procedures  Critical Care performed: no  Observation: yes  ----------------------------------------- 12:13 AM on 05/09/2017 -----------------------------------------   OBSERVATION CARE: This patient is being placed under observation care for the following reasons: Chest pain with repeat testing to rule out ischemia    ____________________________________________   INITIAL  IMPRESSION / ASSESSMENT AND PLAN / ED COURSE  Pertinent labs & imaging results that were available during my care of the patient were reviewed by me and considered in my medical decision making (see chart for details).  ----------------------------------------- 3:35 AM on 05/09/2017 ----------------------------------------- The patient is resting comfortably pending second troponin.     ----------------------------------------- 4:38 AM on 05/09/2017 -----------------------------------------   END OF OBSERVATION STATUS: After an appropriate period of observation, this patient is being discharged due to the following reason(s): The patient's second troponin is negative.  Her EKG shows no ischemic changes.  Her pain is resolved.  She has no evidence of acute myocardial infarction.  She will be discharged home with primary care follow-up.  Strict return precautions have been given and the patient verbalizes understanding and agreement with the  plan.  ____________________________________________   FINAL CLINICAL IMPRESSION(S) / ED DIAGNOSES  Final diagnoses:  Chest pain, unspecified type      NEW MEDICATIONS STARTED DURING THIS VISIT:  New Prescriptions   No medications on file     Note:  This document was prepared using Dragon voice recognition software and may include unintentional dictation errors.     Merrily Brittleifenbark, Hedda Crumbley, MD 05/09/17 [phone]

## 2017-05-09 NOTE — ED Notes (Signed)
Pt signed hard copy of ED discharge due to signature pad not respding, placed on pts chart

## 2017-05-11 DIAGNOSIS — R079 Chest pain, unspecified: Secondary | ICD-10-CM | POA: Diagnosis not present

## 2017-05-11 DIAGNOSIS — G309 Alzheimer's disease, unspecified: Secondary | ICD-10-CM | POA: Diagnosis not present

## 2017-05-11 DIAGNOSIS — N289 Disorder of kidney and ureter, unspecified: Secondary | ICD-10-CM | POA: Diagnosis not present

## 2017-05-11 DIAGNOSIS — E785 Hyperlipidemia, unspecified: Secondary | ICD-10-CM | POA: Diagnosis not present

## 2017-05-11 DIAGNOSIS — R739 Hyperglycemia, unspecified: Secondary | ICD-10-CM | POA: Diagnosis not present

## 2017-05-11 DIAGNOSIS — I10 Essential (primary) hypertension: Secondary | ICD-10-CM | POA: Diagnosis not present

## 2017-05-11 DIAGNOSIS — M1712 Unilateral primary osteoarthritis, left knee: Secondary | ICD-10-CM | POA: Diagnosis not present

## 2017-05-11 DIAGNOSIS — M25562 Pain in left knee: Secondary | ICD-10-CM | POA: Diagnosis not present

## 2017-05-14 DIAGNOSIS — Z79899 Other long term (current) drug therapy: Secondary | ICD-10-CM | POA: Diagnosis not present

## 2017-05-18 DIAGNOSIS — F0391 Unspecified dementia with behavioral disturbance: Secondary | ICD-10-CM | POA: Diagnosis not present

## 2017-05-18 DIAGNOSIS — F321 Major depressive disorder, single episode, moderate: Secondary | ICD-10-CM | POA: Diagnosis not present

## 2017-05-19 DIAGNOSIS — G309 Alzheimer's disease, unspecified: Secondary | ICD-10-CM | POA: Diagnosis not present

## 2017-05-19 DIAGNOSIS — I10 Essential (primary) hypertension: Secondary | ICD-10-CM | POA: Diagnosis not present

## 2017-05-19 DIAGNOSIS — M25562 Pain in left knee: Secondary | ICD-10-CM | POA: Diagnosis not present

## 2017-05-19 DIAGNOSIS — N289 Disorder of kidney and ureter, unspecified: Secondary | ICD-10-CM | POA: Diagnosis not present

## 2017-05-19 DIAGNOSIS — M1712 Unilateral primary osteoarthritis, left knee: Secondary | ICD-10-CM | POA: Diagnosis not present

## 2017-05-19 DIAGNOSIS — Z79899 Other long term (current) drug therapy: Secondary | ICD-10-CM | POA: Diagnosis not present

## 2017-06-02 DIAGNOSIS — I739 Peripheral vascular disease, unspecified: Secondary | ICD-10-CM | POA: Diagnosis not present

## 2017-06-02 DIAGNOSIS — M2010 Hallux valgus (acquired), unspecified foot: Secondary | ICD-10-CM | POA: Diagnosis not present

## 2017-06-02 DIAGNOSIS — B351 Tinea unguium: Secondary | ICD-10-CM | POA: Diagnosis not present

## 2017-06-02 DIAGNOSIS — L84 Corns and callosities: Secondary | ICD-10-CM | POA: Diagnosis not present

## 2017-06-12 DIAGNOSIS — M542 Cervicalgia: Secondary | ICD-10-CM | POA: Diagnosis not present

## 2017-10-10 ENCOUNTER — Other Ambulatory Visit: Payer: Self-pay

## 2017-10-10 ENCOUNTER — Emergency Department
Admission: EM | Admit: 2017-10-10 | Discharge: 2017-10-10 | Disposition: A | Discharge status: Home / Self Care | Payer: Medicare Other | Attending: Emergency Medicine | Admitting: Emergency Medicine

## 2017-10-10 DIAGNOSIS — Z79899 Other long term (current) drug therapy: Secondary | ICD-10-CM | POA: Insufficient documentation

## 2017-10-10 DIAGNOSIS — R451 Restlessness and agitation: Secondary | ICD-10-CM | POA: Diagnosis present

## 2017-10-10 DIAGNOSIS — G300 Alzheimer's disease with early onset: Secondary | ICD-10-CM | POA: Insufficient documentation

## 2017-10-10 DIAGNOSIS — I2510 Atherosclerotic heart disease of native coronary artery without angina pectoris: Secondary | ICD-10-CM | POA: Insufficient documentation

## 2017-10-10 DIAGNOSIS — I10 Essential (primary) hypertension: Secondary | ICD-10-CM | POA: Insufficient documentation

## 2017-10-10 DIAGNOSIS — F0281 Dementia in other diseases classified elsewhere with behavioral disturbance: Secondary | ICD-10-CM | POA: Diagnosis not present

## 2017-10-10 DIAGNOSIS — F0391 Unspecified dementia with behavioral disturbance: Secondary | ICD-10-CM

## 2017-10-10 DIAGNOSIS — F17210 Nicotine dependence, cigarettes, uncomplicated: Secondary | ICD-10-CM | POA: Insufficient documentation

## 2017-10-10 NOTE — ED Provider Notes (Addendum)
[facility] Emergency Department Provider Note  ____________________________________________   I have reviewed the triage vital signs and the nursing notes. Where available I have reviewed prior notes and, if possible and indicated, outside hospital notes.    HISTORY  Chief Complaint evaluation    HPI Tiffany Turner is a 77 y.o. female with a history of dementia, she is here because she got "riled up".  She not physically struck or injured anyone but she was apparently agitated with staff and told 1 of them that he was full of "Mullarkey".  Patient herself has no complaints does not want to be here.  I did call [facility] and they stated that in fact the patient has a long history of somewhat agitated behavior and it seems to be progressive over the years, the state that this is not atypical behavior for her, she is in a dementia ward.  They state they want her prescribe something else for agitation.  They have not yet talked to the primary care doctor today.  Patient denies all complaints and states that she will refrain from using the word [facility] [place], she has no HI or SI and she does not have any signs or symptoms of UTI in her opinion.   Past Medical History:  Diagnosis Date  . Coronary artery disease    stents placed  . Hip dislocation, right (HCC)    hx of dislocation  . Hyperlipemia   . Hypertension     Patient Active Problem List   Diagnosis Date Noted  . 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 Dementia, Alzheimer's, with behavior disturbance 02/16/2015  . Hypertension 02/16/2015    Past Surgical History:  Procedure Laterality Date  . CARDIAC SURGERY     stents  . CORONARY ANGIOPLASTY WITH STENT PLACEMENT      Prior to Admission medications   Medication Sig Start Date End Date Taking? Authorizing Provider  Artificial Tear Ointment (WH PETROL-MINERAL OIL-LANOLIN) 0.1-0.1 % OINT Place 1 drop into  the right eye at bedtime. 01/01/16   [provider]  aspirin 81 MG chewable tablet Chew 81 mg by mouth daily.    [provider]  atorvastatin (LIPITOR) 40 MG tablet Take 40 mg by mouth daily.    [provider]  enalapril (VASOTEC) 20 MG tablet Take 20 mg by mouth at bedtime.     [provider]  fluticasone (FLONASE) 50 MCG/ACT nasal spray Place 2 sprays into both nostrils every 12 (twelve) hours.     [provider]  HYDROcodone-acetaminophen (NORCO/VICODIN) 5-325 MG tablet Take 1 tablet by mouth 2 (two) times daily.    [provider]  ibuprofen (ADVIL,MOTRIN) 400 MG tablet Take 400 mg by mouth 2 (two) times daily.    [provider]  magnesium hydroxide (MILK OF MAGNESIA) 400 MG/5ML suspension Take 30 mLs by mouth daily as needed for mild constipation.    [provider]  metoprolol succinate (TOPROL-XL) 50 MG 24 hr tablet Take 50 mg by mouth daily. Take with or immediately following a meal.    [provider]  risperiDONE (RISPERDAL) 1 MG/ML oral solution Take 0.5 mg by mouth 2 (two) times daily.    [provider]  risperiDONE (RISPERIDONE M-TAB) 0.5 MG disintegrating tablet Take 1 tablet (0.5 mg total) by mouth 2 (two) times daily. Patient not taking: Reported on 05/09/2017 05/09/16   Clapacs, Jackquline DenmarkJohn T, MD  rivastigmine (EXELON) 1.5 MG capsule Take  1.5 mg by mouth 2 (two) times daily.    [provider]  traZODone (DESYREL) 50 MG tablet Take 50 mg by mouth at bedtime.    [provider]  Valproate Sodium (DEPAKENE) 250 MG/5ML SOLN solution Take 250 mg by mouth 2 (two) times daily.    [provider]    Allergies Penicillins and Sulfa antibiotics  No family history on file.  Social History Social History   Tobacco Use  . Smoking status: Current Every Day Smoker    Packs/day: 0.50    Types: Cigarettes  . Smokeless tobacco: Never Used  Substance Use Topics  . Alcohol  use: No  . Drug use: No    Review of Systems Constitutional: No fever/chills Eyes: No visual changes. ENT: No sore throat. No stiff neck no neck pain Cardiovascular: Denies chest pain. Respiratory: Denies shortness of breath. Gastrointestinal:   no vomiting.  No diarrhea.  No constipation. Genitourinary: Negative for dysuria. Musculoskeletal: Negative lower extremity swelling Skin: Negative for rash. Neurological: Negative for severe headaches, focal weakness or numbness.   ____________________________________________   PHYSICAL EXAM:  VITAL SIGNS: ED Triage Vitals  Enc Vitals Group     BP 10/10/17 1816 (!) 153/65     Pulse Rate 10/10/17 1816 97     Resp 10/10/17 1816 16     Temp 10/10/17 1816 (!) 97.2 F (36.2 C)     Temp Source 10/10/17 1816 Oral     SpO2 10/10/17 1816 98 %     Weight 10/10/17 1814 103 lb (46.7 kg)     Height 10/10/17 1814 5\' 2"  (1.575 m)     Head Circumference --      Peak Flow --      Pain Score 10/10/17 1814 0     Pain Loc --      Pain Edu? --      Excl. in GC? --     Constitutional: Alert and oriented. Well appearing and in no acute distress. Eyes: Conjunctivae are normal Head: Atraumatic HEENT: No congestion/rhinnorhea. Mucous membranes are moist.  Oropharynx non-erythematous Neck:   Nontender with no meningismus, no masses, no stridor Cardiovascular: Normal rate, regular rhythm. Grossly normal heart sounds.  Good peripheral circulation. Respiratory: Normal respiratory effort.  No retractions. Lungs CTAB. Abdominal: Soft and nontender. No distention. No guarding no rebound Back:  There is no focal tenderness or step off.  there is no midline tenderness there are no lesions noted. there is no CVA tenderness Musculoskeletal: No lower extremity tenderness, no upper extremity tenderness. No joint effusions, no DVT signs strong distal pulses no edema Neurologic:  Normal speech and language. No gross focal neurologic deficits are appreciated.   Skin:  Skin is warm, dry and intact. No rash noted. Psychiatric: Mood and affect are normal. Speech and behavior are normal.  ____________________________________________   LABS (all labs ordered are listed, but only abnormal results are displayed)  Labs Reviewed - No data to display  Pertinent labs  results that were available during my care of the patient were reviewed by me and considered in my medical decision making (see chart for details). ____________________________________________  EKG  I personally interpreted any EKGs ordered by me or triage  ____________________________________________  RADIOLOGY  Pertinent labs & imaging results that were available during my care of the patient were reviewed by me and considered in my medical decision making (see chart for details). If possible, patient and/or family made aware of any abnormal findings.  No results found.  ____________________________________________    PROCEDURES  Procedure(s) performed: None  Procedures  Critical Care performed: None  ____________________________________________   INITIAL IMPRESSION / ASSESSMENT AND PLAN / ED COURSE  Pertinent labs & imaging results that were available during my care of the patient were reviewed by me and considered in my medical decision making (see chart for details).  Here for somewhat agitated behavior at the nursing home she has no acute complaints at this time and she would prefer not to have any further workup at this time.  She is at her baseline according to EMS and the nursing home and she has no medical complaints.  Chronic ongoing agitation at the nursing home was not usually the kind of thing the emergency room and or seeds upon unless there is some sort of danger to self or others which is not been reported or alleged.  We usually defer sedating medications in elderly patients to the primary care doctor to her better cope to titrate this.  I have called [facility]  [employer] who are her guardian, and we hopefully can get her safely home.  ----------------------------------------- 6:43 PM on 10/10/2017 -----------------------------------------  Discussed with Ms. meadows, of [facility] [employer], as they are her guardian, she agrees that this is a chronic problem in a patient with a history of dementia with behavioral disturbances in the past, there does not appear to be on my initial screening exam any indications that there is any significant pathology present that should appropriately be addressed in the emergency department.  Patient will refer follow-up with primary care I have also talked to [facility] to let them know that occasional agitation in a demented patient is not the kind of thing that we can usually treat here especially if it appears to be a chronic problem however we are always happy to evaluate any patient and do our best to make sure that they get the care that is needed.  They understand that she can return to the emergency room for any new or worrisome symptoms.   ____________________________________________   FINAL CLINICAL IMPRESSION(S) / ED DIAGNOSES  Final diagnoses:  None      This chart was dictated using voice recognition software.  Despite best efforts to proofread,  errors can occur which can change meaning.      Jeanmarie Plant, MD 10/10/17 Kristeen Mans    Jeanmarie Plant, MD 10/10/17 [phone]

## 2017-10-10 NOTE — ED Notes (Signed)
Ambulated to the BR independently.  Steady gait.  NAD.

## 2017-10-10 NOTE — ED Notes (Signed)
Awake, alert, calm and cooperative. NAD

## 2017-10-10 NOTE — ED Triage Notes (Signed)
[facility]  Patient sent to ED for evaluation by staff due to aggressive behavior, but none today.  Patient and EMS states that the patient had asked a staff member to move out of her chair and another staff member "got fresh and started mouthing off".  Patient states she told him he was full of malarkey, and staff called EMS for ED evaluation of behavior.  Patient is AAOx3.  Skin warm and dry. NAD.  Denies all complaints.  STates she does not want to be seen in ED

## 2017-11-06 IMAGING — CT CT HIP*R* W/O CM
2 of 6 series · 15 of 46 positions shown, 19 images · non-contrast
Comparison: Radiographs 12/25/2015

CLINICAL DATA: Injured hip yesterday. Pain is slightly better
today. Remote right total hip arthroplasty.

EXAM:
CT OF THE RIGHT HIP WITHOUT CONTRAST
TECHNIQUE: Multidetector CT imaging of the right hip was performed according to
the standard protocol. Multiplanar CT image reconstructions were
also generated.

[Series 4: hip soft tissue · axial · 0.38mm/px · z∈[-1099,-897]mm · 12 of 116 slices shown, 16 images]
[im 10/116  soft-tissue]
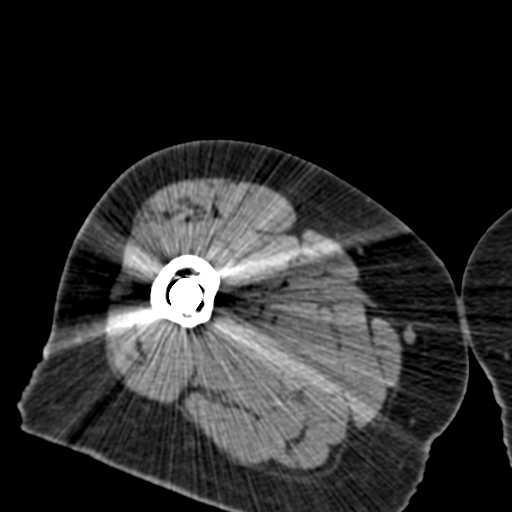
[im 10/116  bone]
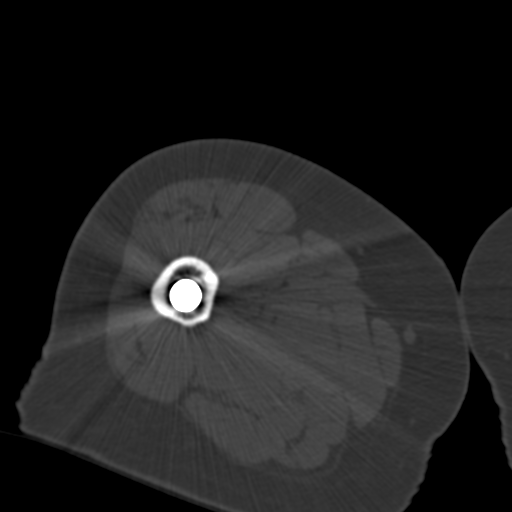
[im 20/116  soft-tissue]
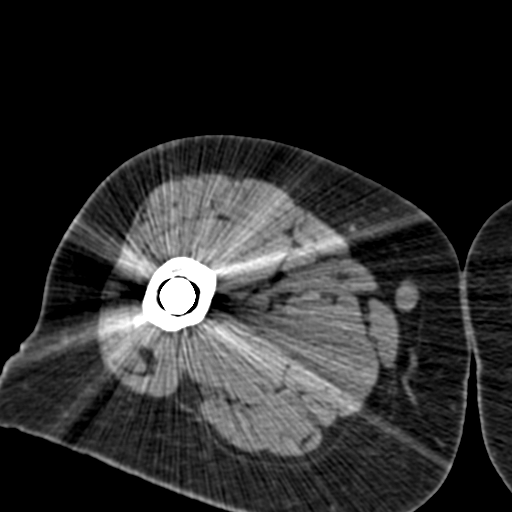
[im 29/116  soft-tissue]
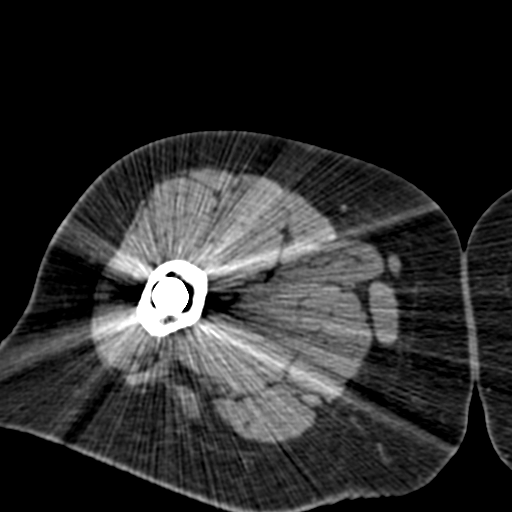
[im 44/116  soft-tissue]
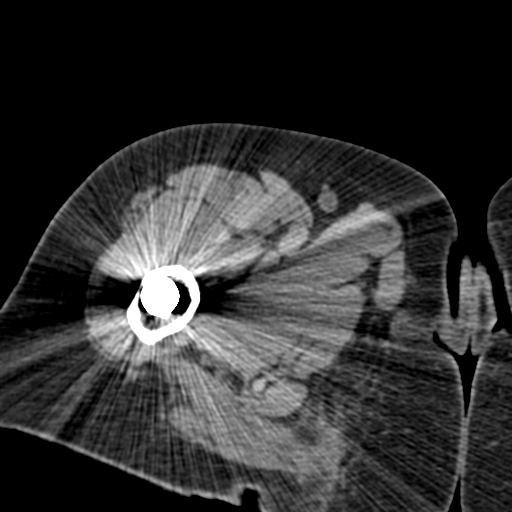
[im 53/116  soft-tissue]
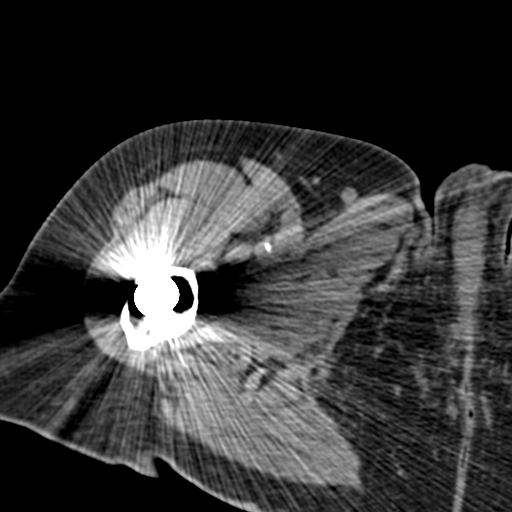
[im 63/116  soft-tissue]
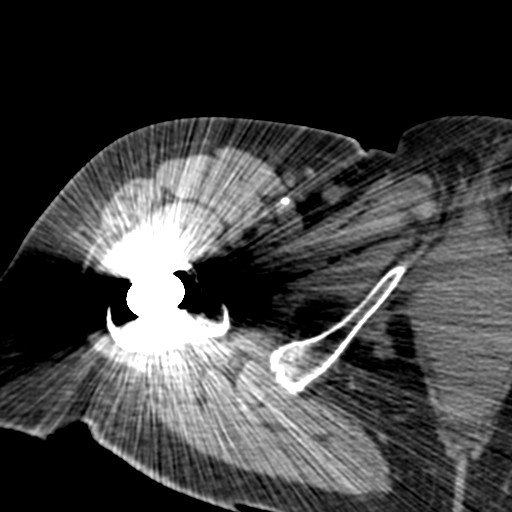
[im 72/116  soft-tissue]
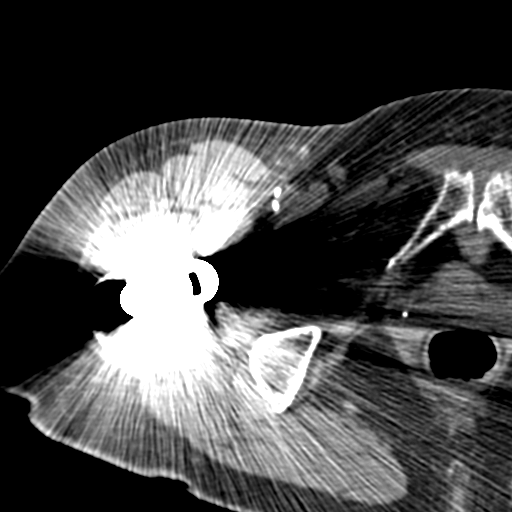
[im 87/116  soft-tissue]
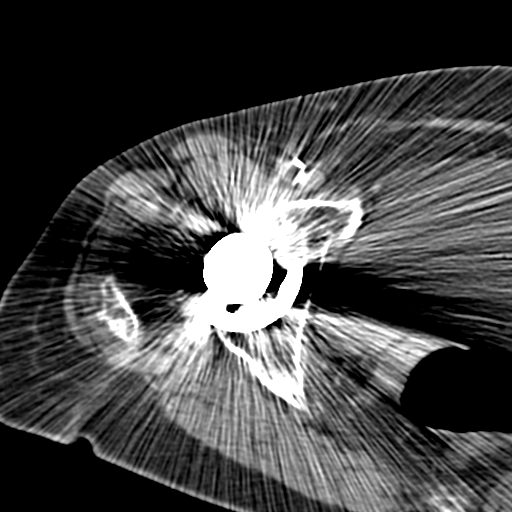
[im 96/116  soft-tissue]
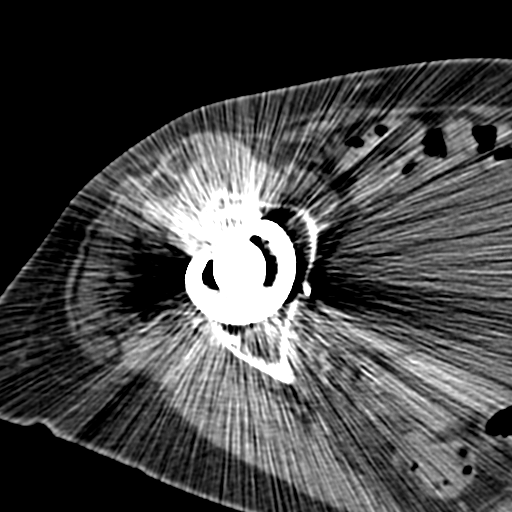
[im 96/116  lung]
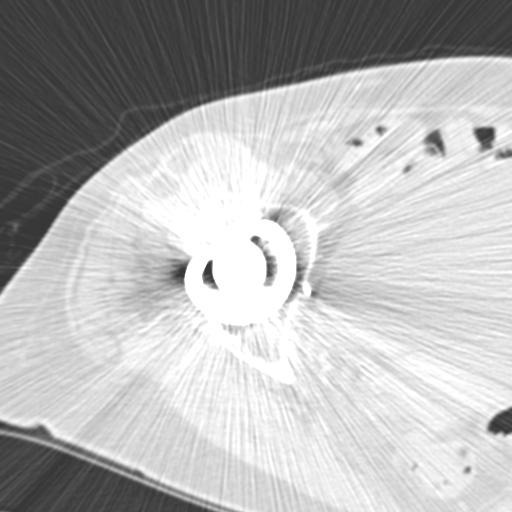
[im 96/116  bone]
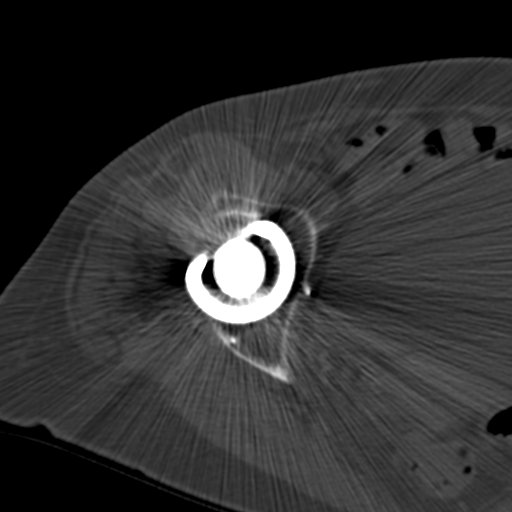
[im 101/116  lung]
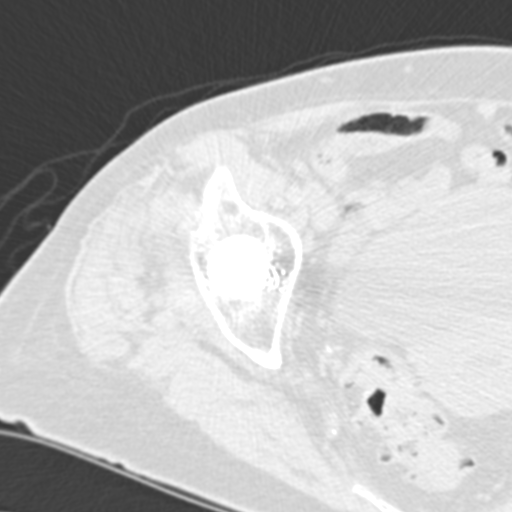
[im 106/116  soft-tissue]
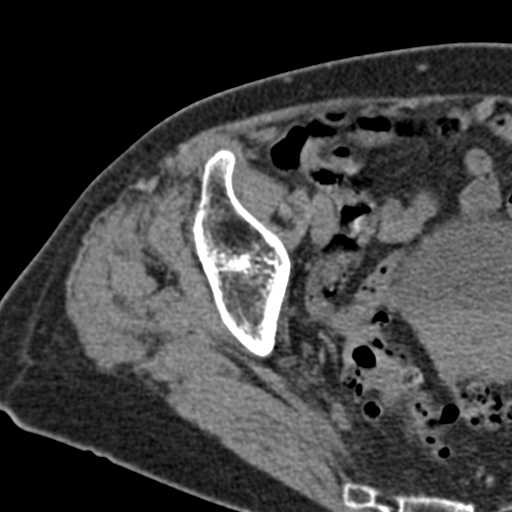
[im 106/116  lung]
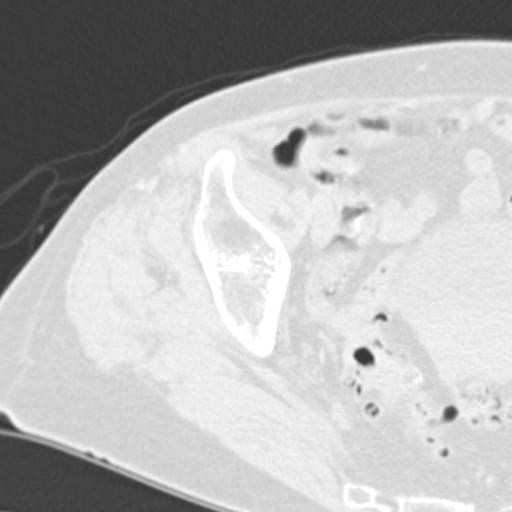
[im 111/116  lung]
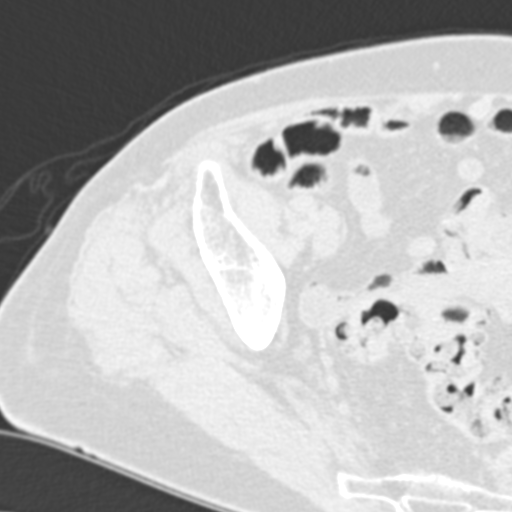

[Series 7: hip soft tissue cor proximal · coronal · 0.37mm/px · 3 of 73 slices shown]
[im 19/73  soft-tissue]
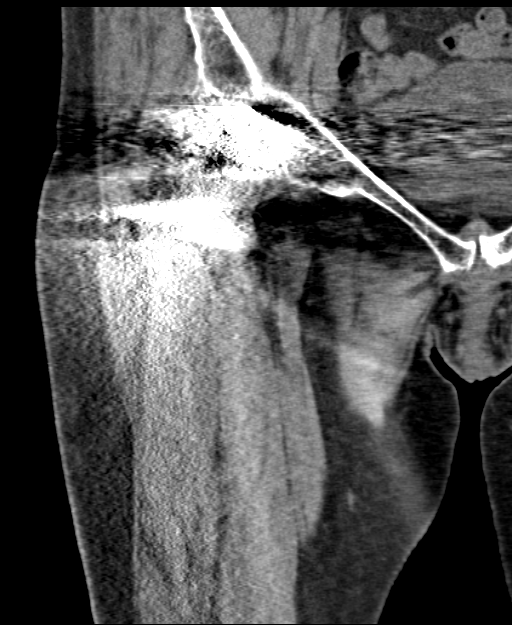
[im 37/73  soft-tissue]
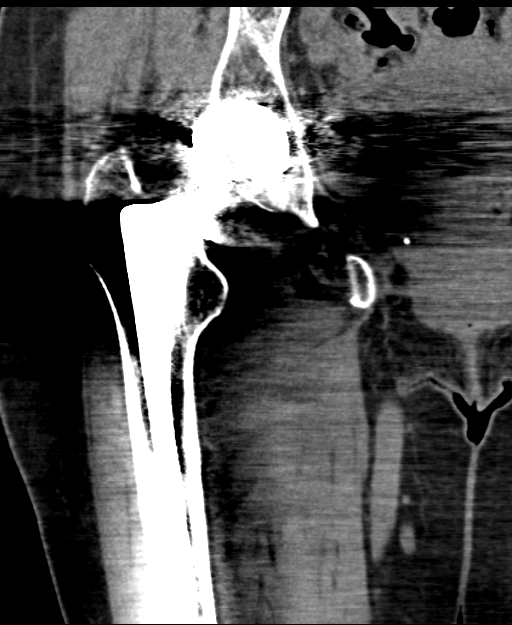
[im 55/73  soft-tissue]
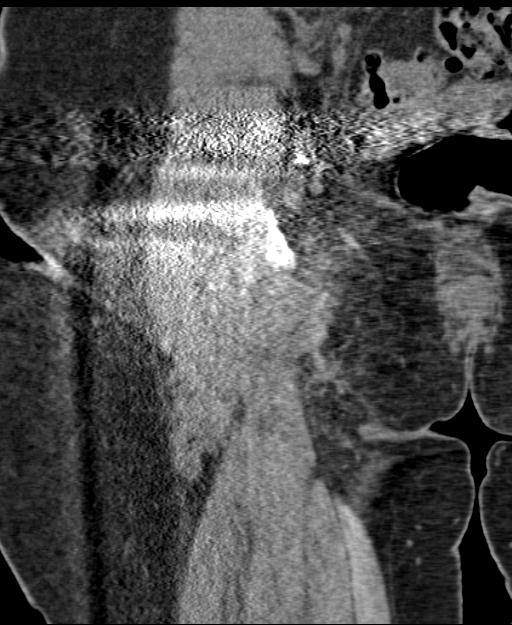

[15 of 46 positions shown; findings below may reference images not displayed]

FINDINGS: The femoral and acetabular components are well seated. No
complicating features such as periprosthetic fracture or
dislocation. The right-sided pubic rami appear intact.
IMPRESSION: Intact femoral and acetabular components. No periprosthetic fracture
or dislocation is identified.

## 2018-01-25 ENCOUNTER — Emergency Department: Payer: Medicare Other

## 2018-01-25 ENCOUNTER — Other Ambulatory Visit: Payer: Self-pay

## 2018-01-25 ENCOUNTER — Emergency Department
Admission: EM | Admit: 2018-01-25 | Discharge: 2018-01-26 | Disposition: A | Discharge status: Skilled Nursing Facility | Payer: Medicare Other | Attending: Emergency Medicine | Admitting: Emergency Medicine

## 2018-01-25 DIAGNOSIS — F17210 Nicotine dependence, cigarettes, uncomplicated: Secondary | ICD-10-CM | POA: Insufficient documentation

## 2018-01-25 DIAGNOSIS — Z955 Presence of coronary angioplasty implant and graft: Secondary | ICD-10-CM | POA: Diagnosis not present

## 2018-01-25 DIAGNOSIS — I2510 Atherosclerotic heart disease of native coronary artery without angina pectoris: Secondary | ICD-10-CM | POA: Insufficient documentation

## 2018-01-25 DIAGNOSIS — F0280 Dementia in other diseases classified elsewhere without behavioral disturbance: Secondary | ICD-10-CM | POA: Insufficient documentation

## 2018-01-25 DIAGNOSIS — R0789 Other chest pain: Secondary | ICD-10-CM | POA: Insufficient documentation

## 2018-01-25 DIAGNOSIS — Z7982 Long term (current) use of aspirin: Secondary | ICD-10-CM | POA: Diagnosis not present

## 2018-01-25 DIAGNOSIS — I10 Essential (primary) hypertension: Secondary | ICD-10-CM | POA: Diagnosis not present

## 2018-01-25 DIAGNOSIS — G309 Alzheimer's disease, unspecified: Secondary | ICD-10-CM | POA: Insufficient documentation

## 2018-01-25 LAB — BASIC METABOLIC PANEL
ANION GAP: 8 (ref 5–15)
BUN: 28 mg/dL — ABNORMAL HIGH (ref 8–23)
CO2: 24 mmol/L (ref 22–32)
Calcium: 8.8 mg/dL — ABNORMAL LOW (ref 8.9–10.3)
Chloride: 107 mmol/L (ref 98–111)
Creatinine, Ser: 1.43 mg/dL — ABNORMAL HIGH (ref 0.44–1.00)
GFR calc non Af Amer: 34 mL/min — ABNORMAL LOW (ref >60)
GFR, EST AFRICAN AMERICAN: 40 mL/min — AB (ref >60)
Glucose, Bld: 121 mg/dL — ABNORMAL HIGH (ref 70–99)
Potassium: 4 mmol/L (ref 3.5–5.1)
Sodium: 139 mmol/L (ref 135–145)

## 2018-01-25 LAB — CBC
HEMATOCRIT: 33.9 % — AB (ref 35.0–47.0)
HEMOGLOBIN: 11.7 g/dL — AB (ref 12.0–16.0)
MCH: 35.9 pg — ABNORMAL HIGH (ref 26.0–34.0)
MCHC: 34.4 g/dL (ref 32.0–36.0)
MCV: 104.2 fL — ABNORMAL HIGH (ref 80.0–100.0)
Platelets: 323 10*3/uL (ref 150–440)
RBC: 3.25 MIL/uL — AB (ref 3.80–5.20)
RDW: 13.1 % (ref 11.5–14.5)
WBC: 7 10*3/uL (ref 3.6–11.0)

## 2018-01-25 LAB — TROPONIN I
Troponin I: <0.03 ng/mL (ref <0.03)
Troponin I: <0.03 ng/mL (ref <0.03)

## 2018-01-25 NOTE — ED Notes (Signed)
Keelis, Child psychotherapistsocial worker, successfully notified that pt will be transported back to [facility] via ems.

## 2018-01-25 NOTE — ED Notes (Signed)
Called Cleophas DunkerAngela Riley, social worker assigned to pt, with no answer. Called [place] [employer] to page on-call social worker. Awaiting call back.

## 2018-01-25 NOTE — ED Triage Notes (Signed)
Pt c/o chest pain after dinner. Denies any s/s at this time.

## 2018-01-25 NOTE — ED Provider Notes (Signed)
[facility] Emergency Department Provider Note ____________________________________________   First MD Initiated Contact with Patient 01/25/18 [phone]     (approximate)  I have reviewed the triage vital signs and the nursing notes.   HISTORY  Chief Complaint Chest Pain    HPI Tiffany Turner is a 77 y.o. female with PMH as noted below who presents with chest pain, acute onset approximately 30 minutes prior to coming to the hospital, described as sharp, substernal, and somewhat radiating to the back.  She states that it has almost completely resolved at this time.  She denies associated shortness of breath, nausea or vomiting, or lightheadedness.  She reports one prior episode of similar pain previously.   Past Medical History:  Diagnosis Date  . Coronary artery disease    stents placed  . Hip dislocation, right (HCC)    hx of dislocation  . Hyperlipemia   . Hypertension     Patient Active Problem List   Diagnosis Date Noted  . 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 Dementia, Alzheimer's, with behavior disturbance 02/16/2015  . Hypertension 02/16/2015    Past Surgical History:  Procedure Laterality Date  . CARDIAC SURGERY     stents  . CORONARY ANGIOPLASTY WITH STENT PLACEMENT      Prior to Admission medications   Medication Sig Start Date End Date Taking? Authorizing Provider  Artificial Tear Ointment (WH PETROL-MINERAL OIL-LANOLIN) 0.1-0.1 % OINT Place 1 drop into the right eye at bedtime. 01/01/16   [provider]  aspirin 81 MG chewable tablet Chew 81 mg by mouth daily.    [provider]  atorvastatin (LIPITOR) 40 MG tablet Take 40 mg by mouth daily.    [provider]  enalapril (VASOTEC) 20 MG tablet Take 20 mg by mouth at bedtime.     [provider]  fluticasone (FLONASE) 50 MCG/ACT nasal spray Place 2 sprays into both nostrils every 12 (twelve) hours.      [provider]  HYDROcodone-acetaminophen (NORCO/VICODIN) 5-325 MG tablet Take 1 tablet by mouth 2 (two) times daily.    [provider]  ibuprofen (ADVIL,MOTRIN) 400 MG tablet Take 400 mg by mouth 2 (two) times daily.    [provider]  magnesium hydroxide (MILK OF MAGNESIA) 400 MG/5ML suspension Take 30 mLs by mouth daily as needed for mild constipation.    [provider]  metoprolol succinate (TOPROL-XL) 50 MG 24 hr tablet Take 50 mg by mouth daily. Take with or immediately following a meal.    [provider]  risperiDONE (RISPERDAL) 1 MG/ML oral solution Take 0.5 mg by mouth 2 (two) times daily.    [provider]  risperiDONE (RISPERIDONE M-TAB) 0.5 MG disintegrating tablet Take 1 tablet (0.5 mg total) by mouth 2 (two) times daily. Patient not taking: Reported on 05/09/2017 05/09/16   Clapacs, Jackquline DenmarkJohn T, MD  rivastigmine (EXELON) 1.5 MG capsule Take 1.5 mg by mouth 2 (two) times daily.    [provider]  traZODone (DESYREL) 50 MG tablet Take 50 mg by mouth at bedtime.    [provider]  Valproate Sodium (DEPAKENE) 250 MG/5ML SOLN solution Take 250 mg by mouth 2 (two) times daily.    [provider]    Allergies Penicillins and Sulfa antibiotics  No family history on file.  Social History Social History   Tobacco Use  . Smoking status: Current Every Day Smoker    Packs/day: 0.50  Types: Cigarettes  . Smokeless tobacco: Never Used  Substance Use Topics  . Alcohol use: No  . Drug use: No    Review of Systems  Constitutional: No fever. Eyes: No redness. ENT: No sore throat. Cardiovascular: Positive for resolved chest pain. Respiratory: Denies shortness of breath. Gastrointestinal: No nausea. Genitourinary: Negative for flank pain.  Musculoskeletal: Negative for back pain. Skin: Negative for rash. Neurological: Negative for  headache.   ____________________________________________   PHYSICAL EXAM:  VITAL SIGNS: ED Triage Vitals [01/25/18 1914]  Enc Vitals Group     BP (!) 147/62     Pulse Rate 66     Resp 18     Temp 98.7 F (37.1 C)     Temp src      SpO2 96 %     Weight 103 lb (46.7 kg)     Height      Head Circumference      Peak Flow      Pain Score 0     Pain Loc      Pain Edu?      Excl. in GC?     Constitutional: Alert and oriented. Well appearing and in no acute distress. Eyes: Conjunctivae are normal.  Head: Atraumatic. Nose: No congestion/rhinnorhea. Mouth/Throat: Mucous membranes are moist.   Neck: Normal range of motion.  Cardiovascular: Normal rate, regular rhythm. Grossly normal heart sounds.  Good peripheral circulation. Respiratory: Normal respiratory effort.  No retractions. Lungs CTAB. Gastrointestinal: Soft and nontender. No distention.  Genitourinary: No flank tenderness. Musculoskeletal: No lower extremity edema.  Extremities warm and well perfused.  Neurologic:  Normal speech and language. No gross focal neurologic deficits are appreciated.  Skin:  Skin is warm and dry. No rash noted. Psychiatric: Mood and affect are normal. Speech and behavior are normal.  ____________________________________________   LABS (all labs ordered are listed, but only abnormal results are displayed)  Labs Reviewed  BASIC METABOLIC PANEL - Abnormal; Notable for the following components:      Result Value   Glucose, Bld 121 (*)    BUN 28 (*)    Creatinine, Ser 1.43 (*)    Calcium 8.8 (*)    GFR calc non Af Amer 34 (*)    GFR calc Af Amer 40 (*)    All other components within normal limits  CBC - Abnormal; Notable for the following components:   RBC 3.25 (*)    Hemoglobin 11.7 (*)    HCT 33.9 (*)    MCV 104.2 (*)    MCH 35.9 (*)    All other components within normal limits  TROPONIN I  TROPONIN I   ____________________________________________  EKG  ED ECG REPORT I,  Dionne Bucy, the attending physician, personally viewed and interpreted this ECG.  Date: 01/25/2018 EKG Time: 1919 Rate: 66 Rhythm: normal sinus rhythm QRS Axis: normal Intervals: normal ST/T Wave abnormalities: normal Narrative Interpretation: no evidence of acute ischemia  ____________________________________________  RADIOLOGY  CXR: No focal infiltrate or other acute findings ____________________________________________   PROCEDURES  Procedure(s) performed: No  Procedures  Critical Care performed: No ____________________________________________   INITIAL IMPRESSION / ASSESSMENT AND PLAN / ED COURSE  Pertinent labs & imaging results that were available during my care of the patient were reviewed by me and considered in my medical decision making (see chart for details).  77 year old female with PMH as noted above including history of CAD presents with sharp, atypical chest pain acute onset a short time after eating and now  mostly resolved.  No significant associated symptoms.  The patient states that she feels well at this time.  I reviewed the past medical records in Epic; the patient was seen in the ED and discharged in November of last year for chest pain with negative work-up at that time.  On exam, she is well-appearing, the vital signs are normal, and the remainder of the exam is unremarkable.  She does report that the pain is somewhat reproducible to palpation of the anterior chest.  Overall I suspect most likely musculoskeletal pain versus GERD.  In this patient with somewhat increased ACS risk, will obtain cardiac enzymes x2.  Although she did report that the pain seemed to radiate to the back, given her stable vital signs and the resolved symptoms there is no evidence of aortic dissection or other vascular etiology, and there is no clinical evidence for DVT or PE.  Anticipate discharge home if negative troponin  x2.  ----------------------------------------- 10:59 PM on 01/25/2018 -----------------------------------------  Patient has remained pain-free for the last several hours since her arrival.  Troponins are negative x2.  Her other lab work-up is unremarkable, and the chest x-ray is clear.  Patient feels well to go home.  She is safe for discharge at this time.  Return precautions given, and she expresses understanding.  ____________________________________________   FINAL CLINICAL IMPRESSION(S) / ED DIAGNOSES  Final diagnoses:  Atypical chest pain      NEW MEDICATIONS STARTED DURING THIS VISIT:  New Prescriptions   No medications on file     Note:  This document was prepared using Dragon voice recognition software and may include unintentional dictation errors.    Dionne Bucy, MD 01/25/18 2300

## 2018-01-25 NOTE — ED Notes (Addendum)
Received call from on-call social worker, notified that they are no longer guardian's for pt. Pt's son is now the current guardian. There is no number listed for son but there is a number listed for Anselmo RodClay, Craig ([phone]) which states that he is the legal guardian. Multiple calls placed, busy tone. Will inform [facility].

## 2018-01-25 NOTE — Discharge Instructions (Addendum)
Return to the ER for new, worsening, persistent severe chest pain, difficulty breathing, weakness or lightheadedness, or any other new or worsening symptoms that concern you.  Make an appointment to follow-up with your cardiologist.

## 2018-01-26 NOTE — ED Notes (Signed)
Report called to Tiffany at [facility].

## 2018-03-31 ENCOUNTER — Emergency Department
Admission: EM | Admit: 2018-03-31 | Discharge: 2018-03-31 | Disposition: A | Discharge status: Home / Self Care | Payer: Medicare Other | Attending: Emergency Medicine | Admitting: Emergency Medicine

## 2018-03-31 DIAGNOSIS — F17210 Nicotine dependence, cigarettes, uncomplicated: Secondary | ICD-10-CM | POA: Insufficient documentation

## 2018-03-31 DIAGNOSIS — I10 Essential (primary) hypertension: Secondary | ICD-10-CM | POA: Insufficient documentation

## 2018-03-31 DIAGNOSIS — Z79899 Other long term (current) drug therapy: Secondary | ICD-10-CM | POA: Insufficient documentation

## 2018-03-31 DIAGNOSIS — N3001 Acute cystitis with hematuria: Secondary | ICD-10-CM | POA: Insufficient documentation

## 2018-03-31 DIAGNOSIS — G300 Alzheimer's disease with early onset: Secondary | ICD-10-CM | POA: Diagnosis not present

## 2018-03-31 DIAGNOSIS — Z7982 Long term (current) use of aspirin: Secondary | ICD-10-CM | POA: Diagnosis not present

## 2018-03-31 DIAGNOSIS — I2510 Atherosclerotic heart disease of native coronary artery without angina pectoris: Secondary | ICD-10-CM | POA: Insufficient documentation

## 2018-03-31 DIAGNOSIS — F0280 Dementia in other diseases classified elsewhere without behavioral disturbance: Secondary | ICD-10-CM | POA: Insufficient documentation

## 2018-03-31 DIAGNOSIS — Z955 Presence of coronary angioplasty implant and graft: Secondary | ICD-10-CM | POA: Insufficient documentation

## 2018-03-31 DIAGNOSIS — R319 Hematuria, unspecified: Secondary | ICD-10-CM | POA: Diagnosis present

## 2018-03-31 LAB — BASIC METABOLIC PANEL
Anion gap: 10 (ref 5–15)
BUN: 30 mg/dL — AB (ref 8–23)
CO2: 25 mmol/L (ref 22–32)
Calcium: 8.9 mg/dL (ref 8.9–10.3)
Chloride: 103 mmol/L (ref 98–111)
Creatinine, Ser: 1.23 mg/dL — ABNORMAL HIGH (ref 0.44–1.00)
GFR calc Af Amer: 47 mL/min — ABNORMAL LOW (ref >60)
GFR calc non Af Amer: 41 mL/min — ABNORMAL LOW (ref >60)
Glucose, Bld: 133 mg/dL — ABNORMAL HIGH (ref 70–99)
Potassium: 5 mmol/L (ref 3.5–5.1)
SODIUM: 138 mmol/L (ref 135–145)

## 2018-03-31 LAB — URINALYSIS, COMPLETE (UACMP) WITH MICROSCOPIC
Bilirubin Urine: NEGATIVE
Glucose, UA: NEGATIVE mg/dL
KETONES UR: 5 mg/dL — AB
Nitrite: NEGATIVE
PH: 5 (ref 5.0–8.0)
Protein, ur: 100 mg/dL — AB
SPECIFIC GRAVITY, URINE: 1.014 (ref 1.005–1.030)

## 2018-03-31 LAB — CBC WITH DIFFERENTIAL/PLATELET
Basophils Absolute: 0 10*3/uL (ref 0–0.1)
Basophils Relative: 0 %
EOS ABS: 0.1 10*3/uL (ref 0–0.7)
EOS PCT: 1 %
HCT: 30.6 % — ABNORMAL LOW (ref 35.0–47.0)
Hemoglobin: 10.8 g/dL — ABNORMAL LOW (ref 12.0–16.0)
LYMPHS ABS: 1.1 10*3/uL (ref 1.0–3.6)
Lymphocytes Relative: 13 %
MCH: 36.4 pg — AB (ref 26.0–34.0)
MCHC: 35.3 g/dL (ref 32.0–36.0)
MCV: 103.1 fL — ABNORMAL HIGH (ref 80.0–100.0)
MONO ABS: 0.8 10*3/uL (ref 0.2–0.9)
MONOS PCT: 9 %
Neutro Abs: 6.6 10*3/uL — ABNORMAL HIGH (ref 1.4–6.5)
Neutrophils Relative %: 77 %
PLATELETS: 338 10*3/uL (ref 150–440)
RBC: 2.97 MIL/uL — ABNORMAL LOW (ref 3.80–5.20)
RDW: 12.8 % (ref 11.5–14.5)
WBC: 8.6 10*3/uL (ref 3.6–11.0)

## 2018-03-31 MED ORDER — FOSFOMYCIN TROMETHAMINE 3 G PO PACK
3.0000 g | PACK | Freq: Once | ORAL | Status: AC
  Administered 2018-03-31: 3 g via ORAL
  Filled 2018-03-31: qty 3

## 2018-03-31 MED ORDER — WH PETROL-MINERAL OIL-LANOLIN 0.1-0.1 % OP OINT: 0.5000 [in_us] | TOPICAL_OINTMENT | Freq: Two times a day (BID) | OPHTHALMIC | 2 refills | Status: DC

## 2018-03-31 NOTE — ED Notes (Signed)
Pt aware we are waiting for EMS to take pt back to [facility]. Pt denies needs/complaints. ABCs intact. VSS. NAD

## 2018-03-31 NOTE — Discharge Instructions (Signed)
Your labs show a urinary tract infection but not any complications.  Vital signs were normal.  We gave you an antibiotic that should treat the infection with a single dose.  Please follow-up with your doctor for continued monitoring of your symptoms in the next 2 days.  A urine culture was started today which can be followed up as well.

## 2018-03-31 NOTE — ED Provider Notes (Signed)
[facility] Emergency Department Provider Note  ____________________________________________  Time seen: Approximately 4:58 PM  I have reviewed the triage vital signs and the nursing notes.   HISTORY  Chief Complaint Urinary Tract Infection  Level 5 Caveat: Portions of the History and Physical including HPI and review of systems are unable to be completely obtained due to patient being a poor historian due to advanced dementia   HPI Tiffany Turner is a 77 y.o. female with a history of hypertension hyperlipidemia CAD Alzheimer's dementia who was sent to the ED with a report of blood in the urine happened yesterday and today but then resolved.  No other history is available.  Some labs were performed by doctors making house calls 2 days ago which show a normal chemistry panel, normal CBC, normal TSH.  A urine infection DNA screen was run that was positive for E. coli and Klebsiella.  A urine antibiotic resistance gene DNA test was also performed which suggests these infections will be resistant to cephalosporins macrolides and fluoroquinolones.  Does not address sulfonamides or fosfomycin or Macrobid.  It suggest that there will be penicillin susceptibility.    Patient reports that she feels sick but is unable to relate any specific symptoms.  She ate pasta for lunch.  Past Medical History:  Diagnosis Date  . Coronary artery disease    stents placed  . Hip dislocation, right (HCC)    hx of dislocation  . Hyperlipemia   . Hypertension      Patient Active Problem List   Diagnosis Date Noted  . 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 Dementia, Alzheimer's, with behavior disturbance 02/16/2015  . Hypertension 02/16/2015     Past Surgical History:  Procedure Laterality Date  . CARDIAC SURGERY     stents  . CORONARY ANGIOPLASTY WITH STENT PLACEMENT       Prior to Admission medications   Medication Sig Start  Date End Date Taking? Authorizing Provider  Artificial Tear Ointment (WH PETROL-MINERAL OIL-LANOLIN) 0.1-0.1 % OINT Place 0.5 inches into the right eye 2 (two) times daily. 03/31/18   Sharman Cheek, MD  aspirin 81 MG chewable tablet Chew 81 mg by mouth daily.    [provider]  atorvastatin (LIPITOR) 40 MG tablet Take 40 mg by mouth daily.    [provider]  enalapril (VASOTEC) 20 MG tablet Take 20 mg by mouth at bedtime.     [provider]  fluticasone (FLONASE) 50 MCG/ACT nasal spray Place 2 sprays into both nostrils every 12 (twelve) hours.     [provider]  HYDROcodone-acetaminophen (NORCO/VICODIN) 5-325 MG tablet Take 1 tablet by mouth 2 (two) times daily.    [provider]  ibuprofen (ADVIL,MOTRIN) 400 MG tablet Take 400 mg by mouth 2 (two) times daily.    [provider]  magnesium hydroxide (MILK OF MAGNESIA) 400 MG/5ML suspension Take 30 mLs by mouth daily as needed for mild constipation.    [provider]  metoprolol succinate (TOPROL-XL) 50 MG 24 hr tablet Take 50 mg by mouth daily. Take with or immediately following a meal.    [provider]  risperiDONE (RISPERDAL) 1 MG/ML oral solution Take 0.5 mg by mouth 2 (two) times daily.    [provider]  risperiDONE (RISPERIDONE M-TAB) 0.5 MG disintegrating tablet Take 1 tablet (0.5 mg total) by mouth 2 (two) times daily. Patient not taking: Reported on 05/09/2017 05/09/16   Clapacs, Jonny Ruiz  T, MD  rivastigmine (EXELON) 1.5 MG capsule Take 1.5 mg by mouth 2 (two) times daily.    [provider]  traZODone (DESYREL) 50 MG tablet Take 50 mg by mouth at bedtime.    [provider]  Valproate Sodium (DEPAKENE) 250 MG/5ML SOLN solution Take 250 mg by mouth 2 (two) times daily.    [provider]     Allergies Penicillins and Sulfa antibiotics   No family history on file.  Social History Social History   Tobacco Use  .  Smoking status: Current Every Day Smoker    Packs/day: 0.50    Types: Cigarettes  . Smokeless tobacco: Never Used  Substance Use Topics  . Alcohol use: No  . Drug use: No    Review of Systems Level 5 Caveat: Portions of the History and Physical including HPI and review of systems are unable to be completely obtained due to patient being a poor historian due to advanced dementia  ____________________________________________   PHYSICAL EXAM:  VITAL SIGNS: ED Triage Vitals  Enc Vitals Group     BP 03/31/18 1337 115/64     Pulse Rate 03/31/18 1337 77     Resp 03/31/18 1337 19     Temp 03/31/18 1337 97.8 F (36.6 C)     Temp Source 03/31/18 1337 Oral     SpO2 03/31/18 1337 95 %     Weight 03/31/18 1338 105 lb 13.1 oz (48 kg)     Height --      Head Circumference --      Peak Flow --      Pain Score --      Pain Loc --      Pain Edu? --      Excl. in GC? --     Vital signs reviewed, nursing assessments reviewed.   Constitutional:   Alert and oriented to self. Non-toxic appearance. Eyes:   Conjunctivae are normal. EOMI. PERRL. ENT      Head:   Normocephalic and atraumatic.      Nose:   No congestion/rhinnorhea.       Mouth/Throat:   MMM, no pharyngeal erythema. No peritonsillar mass.       Neck:   No meningismus. Full ROM. Hematological/Lymphatic/Immunilogical:   No cervical lymphadenopathy. Cardiovascular:   RRR. Symmetric bilateral radial and DP pulses.  No murmurs. Cap refill less than 2 seconds. Respiratory:   Normal respiratory effort without tachypnea/retractions. Breath sounds are clear and equal bilaterally. No wheezes/rales/rhonchi. Gastrointestinal:   Soft with suprapubic tenderness. Non distended. There is no CVA tenderness.  No rebound, rigidity, or guarding. Musculoskeletal:   Normal range of motion in all extremities. No joint effusions.  No lower extremity tenderness.  No edema. Neurologic:   Normal speech and language.  Motor grossly intact. No acute  focal neurologic deficits are appreciated.  Skin:    Skin is warm, dry and intact. No rash noted.  No petechiae, purpura, or bullae.  ____________________________________________    LABS (pertinent positives/negatives) (all labs ordered are listed, but only abnormal results are displayed) Labs Reviewed  CBC WITH DIFFERENTIAL/PLATELET - Abnormal; Notable for the following components:      Result Value   RBC 2.97 (*)    Hemoglobin 10.8 (*)    HCT 30.6 (*)    MCV 103.1 (*)    MCH 36.4 (*)    Neutro Abs 6.6 (*)    All other components within normal limits  BASIC METABOLIC PANEL - Abnormal; Notable for  the following components:   Glucose, Bld 133 (*)    BUN 30 (*)    Creatinine, Ser 1.23 (*)    GFR calc non Af Amer 41 (*)    GFR calc Af Amer 47 (*)    All other components within normal limits  URINALYSIS, COMPLETE (UACMP) WITH MICROSCOPIC - Abnormal; Notable for the following components:   Color, Urine YELLOW (*)    APPearance CLOUDY (*)    Hgb urine dipstick MODERATE (*)    Ketones, ur 5 (*)    Protein, ur 100 (*)    Leukocytes, UA LARGE (*)    WBC, UA >50 (*)    Bacteria, UA FEW (*)    All other components within normal limits  URINE CULTURE   ____________________________________________   EKG    ____________________________________________    RADIOLOGY  No results found.  ____________________________________________   PROCEDURES Procedures  ____________________________________________    CLINICAL IMPRESSION / ASSESSMENT AND PLAN / ED COURSE  Pertinent labs & imaging results that were available during my care of the patient were reviewed by me and considered in my medical decision making (see chart for details).    Patient presents with abnormal urine result, labs do show a urinary tract infection.  Baseline CKD.  Vital signs are normal, no evidence of pyelonephritis or sepsis.  She is overall well-appearing and not significantly dehydrated.  Tolerating  oral intake.  It is unclear how to interpret this panel of resistance gene testing that was provided by her primary care service.  Clinical Course as of Mar 31 1657  Wed Mar 31, 2018  1559 Discussed with infectious disease Dr. Joylene Draft, suggest fosfomycin given the patient is overall not ill-appearing not septic.  I doubt pyelonephritis.  Suitable for outpatient management while urine culture is performed.   [PS]    Clinical Course User Index [PS] Sharman Cheek, MD     ____________________________________________   FINAL CLINICAL IMPRESSION(S) / ED DIAGNOSES    Final diagnoses:  Acute cystitis with hematuria     ED Discharge Orders         Ordered    Artificial Tear Ointment (WH PETROL-MINERAL OIL-LANOLIN) 0.1-0.1 % OINT  2 times daily     03/31/18 1657          Portions of this note were generated with dragon dictation software. Dictation errors may occur despite best attempts at proofreading.    Sharman Cheek, MD 03/31/18 1705

## 2018-03-31 NOTE — ED Notes (Signed)
[facility] here to take pt back to [facility]. At time of leaving ED, pt alert, ABCs intact. NAD.

## 2018-03-31 NOTE — ED Notes (Signed)
Pt straight cath'd per [facility] protocol. Sterility maintained throughout. Carollee HerterShannon RN witness. Pt tolerated cath well. Specimen sent to lab for testing.

## 2018-03-31 NOTE — ED Triage Notes (Signed)
Pt BIB [facility] from [facility] for UTI/hematuria. Hx dementia. Pt endorses weakness and decreased appetite.

## 2018-03-31 NOTE — ED Notes (Signed)
[facility]  CALLED  FOR  TRANSPORT   TO  [facility] 

## 2018-03-31 NOTE — ED Notes (Signed)
Dee or [place] at [facility] for updates on pt

## 2018-04-04 LAB — URINE CULTURE: Culture: 30000 — AB

## 2018-04-14 ENCOUNTER — Emergency Department: Payer: Medicare Other

## 2018-04-14 ENCOUNTER — Other Ambulatory Visit: Payer: Self-pay

## 2018-04-14 ENCOUNTER — Inpatient Hospital Stay
Admission: EM | Admit: 2018-04-14 | Discharge: 2018-04-18 | DRG: 871 | Disposition: A | Discharge status: Nursing Home (Includes ICF) | Payer: Medicare Other | Attending: Internal Medicine | Admitting: Internal Medicine

## 2018-04-14 DIAGNOSIS — F17210 Nicotine dependence, cigarettes, uncomplicated: Secondary | ICD-10-CM | POA: Diagnosis present

## 2018-04-14 DIAGNOSIS — Z79891 Long term (current) use of opiate analgesic: Secondary | ICD-10-CM

## 2018-04-14 DIAGNOSIS — I10 Essential (primary) hypertension: Secondary | ICD-10-CM | POA: Diagnosis present

## 2018-04-14 DIAGNOSIS — R402143 Coma scale, eyes open, spontaneous, at hospital admission: Secondary | ICD-10-CM | POA: Diagnosis present

## 2018-04-14 DIAGNOSIS — J189 Pneumonia, unspecified organism: Secondary | ICD-10-CM | POA: Diagnosis present

## 2018-04-14 DIAGNOSIS — F0390 Unspecified dementia without behavioral disturbance: Secondary | ICD-10-CM | POA: Diagnosis present

## 2018-04-14 DIAGNOSIS — E875 Hyperkalemia: Secondary | ICD-10-CM | POA: Diagnosis present

## 2018-04-14 DIAGNOSIS — I2510 Atherosclerotic heart disease of native coronary artery without angina pectoris: Secondary | ICD-10-CM | POA: Diagnosis present

## 2018-04-14 DIAGNOSIS — N3001 Acute cystitis with hematuria: Secondary | ICD-10-CM | POA: Diagnosis present

## 2018-04-14 DIAGNOSIS — B961 Klebsiella pneumoniae [K. pneumoniae] as the cause of diseases classified elsewhere: Secondary | ICD-10-CM | POA: Diagnosis present

## 2018-04-14 DIAGNOSIS — J9601 Acute respiratory failure with hypoxia: Secondary | ICD-10-CM | POA: Diagnosis present

## 2018-04-14 DIAGNOSIS — Z96641 Presence of right artificial hip joint: Secondary | ICD-10-CM | POA: Diagnosis present

## 2018-04-14 DIAGNOSIS — N390 Urinary tract infection, site not specified: Secondary | ICD-10-CM

## 2018-04-14 DIAGNOSIS — R339 Retention of urine, unspecified: Secondary | ICD-10-CM

## 2018-04-14 DIAGNOSIS — R402213 Coma scale, best verbal response, none, at hospital admission: Secondary | ICD-10-CM | POA: Diagnosis present

## 2018-04-14 DIAGNOSIS — Z882 Allergy status to sulfonamides status: Secondary | ICD-10-CM | POA: Diagnosis not present

## 2018-04-14 DIAGNOSIS — R402333 Coma scale, best motor response, abnormal, at hospital admission: Secondary | ICD-10-CM | POA: Diagnosis present

## 2018-04-14 DIAGNOSIS — G9341 Metabolic encephalopathy: Secondary | ICD-10-CM | POA: Diagnosis present

## 2018-04-14 DIAGNOSIS — N183 Chronic kidney disease, stage 3 (moderate): Secondary | ICD-10-CM | POA: Diagnosis present

## 2018-04-14 DIAGNOSIS — Z7189 Other specified counseling: Secondary | ICD-10-CM

## 2018-04-14 DIAGNOSIS — Z7982 Long term (current) use of aspirin: Secondary | ICD-10-CM

## 2018-04-14 DIAGNOSIS — E870 Hyperosmolality and hypernatremia: Secondary | ICD-10-CM | POA: Diagnosis present

## 2018-04-14 DIAGNOSIS — Z515 Encounter for palliative care: Secondary | ICD-10-CM | POA: Diagnosis present

## 2018-04-14 DIAGNOSIS — R319 Hematuria, unspecified: Secondary | ICD-10-CM

## 2018-04-14 DIAGNOSIS — N3000 Acute cystitis without hematuria: Secondary | ICD-10-CM | POA: Diagnosis present

## 2018-04-14 DIAGNOSIS — F05 Delirium due to known physiological condition: Secondary | ICD-10-CM | POA: Diagnosis present

## 2018-04-14 DIAGNOSIS — A419 Sepsis, unspecified organism: Secondary | ICD-10-CM | POA: Diagnosis not present

## 2018-04-14 DIAGNOSIS — N179 Acute kidney failure, unspecified: Secondary | ICD-10-CM | POA: Diagnosis present

## 2018-04-14 DIAGNOSIS — Z955 Presence of coronary angioplasty implant and graft: Secondary | ICD-10-CM

## 2018-04-14 DIAGNOSIS — I129 Hypertensive chronic kidney disease with stage 1 through stage 4 chronic kidney disease, or unspecified chronic kidney disease: Secondary | ICD-10-CM | POA: Diagnosis present

## 2018-04-14 DIAGNOSIS — F03A0 Unspecified dementia, mild, without behavioral disturbance, psychotic disturbance, mood disturbance, and anxiety: Secondary | ICD-10-CM

## 2018-04-14 DIAGNOSIS — R4182 Altered mental status, unspecified: Secondary | ICD-10-CM

## 2018-04-14 DIAGNOSIS — E785 Hyperlipidemia, unspecified: Secondary | ICD-10-CM | POA: Diagnosis present

## 2018-04-14 DIAGNOSIS — Z79899 Other long term (current) drug therapy: Secondary | ICD-10-CM

## 2018-04-14 DIAGNOSIS — D631 Anemia in chronic kidney disease: Secondary | ICD-10-CM | POA: Diagnosis present

## 2018-04-14 DIAGNOSIS — Z993 Dependence on wheelchair: Secondary | ICD-10-CM

## 2018-04-14 DIAGNOSIS — Z880 Allergy status to penicillin: Secondary | ICD-10-CM

## 2018-04-14 HISTORY — DX: Unspecified dementia, unspecified severity, without behavioral disturbance, psychotic disturbance, mood disturbance, and anxiety: F03.90

## 2018-04-14 LAB — COMPREHENSIVE METABOLIC PANEL
ALT: 20 U/L (ref 0–44)
ANION GAP: 10 (ref 5–15)
AST: 50 U/L — AB (ref 15–41)
Albumin: 2.3 g/dL — ABNORMAL LOW (ref 3.5–5.0)
Alkaline Phosphatase: 62 U/L (ref 38–126)
BILIRUBIN TOTAL: 0.6 mg/dL (ref 0.3–1.2)
BUN: 118 mg/dL — ABNORMAL HIGH (ref 8–23)
CHLORIDE: 117 mmol/L — AB (ref 98–111)
CO2: 16 mmol/L — ABNORMAL LOW (ref 22–32)
Calcium: 8.1 mg/dL — ABNORMAL LOW (ref 8.9–10.3)
Creatinine, Ser: 3.5 mg/dL — ABNORMAL HIGH (ref 0.44–1.00)
GFR, EST AFRICAN AMERICAN: 13 mL/min — AB (ref >60)
GFR, EST NON AFRICAN AMERICAN: 12 mL/min — AB (ref >60)
Glucose, Bld: 265 mg/dL — ABNORMAL HIGH (ref 70–99)
POTASSIUM: 5.6 mmol/L — AB (ref 3.5–5.1)
Sodium: 143 mmol/L (ref 135–145)
TOTAL PROTEIN: 5.9 g/dL — AB (ref 6.5–8.1)

## 2018-04-14 LAB — CBC WITH DIFFERENTIAL/PLATELET
Abs Immature Granulocytes: 0.05 10*3/uL (ref 0.00–0.07)
BASOS PCT: 0 %
Basophils Absolute: 0 10*3/uL (ref 0.0–0.1)
EOS ABS: 0 10*3/uL (ref 0.0–0.5)
EOS PCT: 0 %
HEMATOCRIT: 30.1 % — AB (ref 36.0–46.0)
Hemoglobin: 9.7 g/dL — ABNORMAL LOW (ref 12.0–15.0)
Immature Granulocytes: 1 %
LYMPHS ABS: 1.2 10*3/uL (ref 0.7–4.0)
Lymphocytes Relative: 12 %
MCH: 34.9 pg — AB (ref 26.0–34.0)
MCHC: 32.2 g/dL (ref 30.0–36.0)
MCV: 108.3 fL — AB (ref 80.0–100.0)
MONO ABS: 0.9 10*3/uL (ref 0.1–1.0)
MONOS PCT: 9 %
NEUTROS PCT: 78 %
Neutro Abs: 7.6 10*3/uL (ref 1.7–7.7)
PLATELETS: 356 10*3/uL (ref 150–400)
RBC: 2.78 MIL/uL — ABNORMAL LOW (ref 3.87–5.11)
RDW: 13.2 % (ref 11.5–15.5)
WBC: 9.9 10*3/uL (ref 4.0–10.5)
nRBC: 0 % (ref 0.0–0.2)

## 2018-04-14 LAB — URINALYSIS, COMPLETE (UACMP) WITH MICROSCOPIC
RBC / HPF: >50 RBC/hpf — ABNORMAL HIGH (ref 0–5)
SPECIFIC GRAVITY, URINE: 1.022 (ref 1.005–1.030)
SQUAMOUS EPITHELIAL / LPF: NONE SEEN (ref 0–5)
WBC, UA: >50 WBC/hpf — ABNORMAL HIGH (ref 0–5)

## 2018-04-14 LAB — TROPONIN I: TROPONIN I: 0.05 ng/mL — AB (ref <0.03)

## 2018-04-14 LAB — LACTIC ACID, PLASMA
LACTIC ACID, VENOUS: 1 mmol/L (ref 0.5–1.9)
LACTIC ACID, VENOUS: 2.4 mmol/L — AB (ref 0.5–1.9)

## 2018-04-14 LAB — AMMONIA: AMMONIA: 27 umol/L (ref 9–35)

## 2018-04-14 LAB — HEMOGLOBIN A1C
HEMOGLOBIN A1C: 6 % — AB (ref 4.8–5.6)
MEAN PLASMA GLUCOSE: 125.5 mg/dL

## 2018-04-14 LAB — GLUCOSE, CAPILLARY
GLUCOSE-CAPILLARY: 109 mg/dL — AB (ref 70–99)
Glucose-Capillary: 109 mg/dL — ABNORMAL HIGH (ref 70–99)

## 2018-04-14 MED ORDER — ACETAMINOPHEN 650 MG RE SUPP: 650.0000 mg | Freq: Four times a day (QID) | RECTAL | Status: DC | PRN

## 2018-04-14 MED ORDER — ONDANSETRON HCL 4 MG/2ML IJ SOLN: 4.0000 mg | Freq: Four times a day (QID) | INTRAMUSCULAR | Status: DC | PRN

## 2018-04-14 MED ORDER — ASPIRIN 300 MG RE SUPP
300.0000 mg | Freq: Every day | RECTAL | Status: DC
  Administered 2018-04-14 – 2018-04-17 (×4): 300 mg via RECTAL
  Filled 2018-04-14 (×4): qty 1

## 2018-04-14 MED ORDER — NAPHAZOLINE-GLYCERIN 0.012-0.2 % OP SOLN
2.0000 [drp] | Freq: Every day | OPHTHALMIC | Status: DC
  Administered 2018-04-14 – 2018-04-17 (×6): 2 [drp] via OPHTHALMIC
  Filled 2018-04-14: qty 15

## 2018-04-14 MED ORDER — INSULIN ASPART 100 UNIT/ML ~~LOC~~ SOLN
0.0000 [IU] | Freq: Three times a day (TID) | SUBCUTANEOUS | Status: DC
  Administered 2018-04-15: 18:00:00 2 [IU] via SUBCUTANEOUS
  Administered 2018-04-15 – 2018-04-18 (×3): 1 [IU] via SUBCUTANEOUS
  Filled 2018-04-14 (×5): qty 1

## 2018-04-14 MED ORDER — MORPHINE SULFATE (CONCENTRATE) 10 MG/0.5ML PO SOLN: 5.0000 mg | ORAL | Status: DC | PRN

## 2018-04-14 MED ORDER — LEVOFLOXACIN IN D5W 750 MG/150ML IV SOLN
750.0000 mg | Freq: Once | INTRAVENOUS | Status: AC
  Administered 2018-04-14: 19:00:00 750 mg via INTRAVENOUS
  Filled 2018-04-14 (×3): qty 150

## 2018-04-14 MED ORDER — POLYVINYL ALCOHOL 1.4 % OP SOLN
1.0000 [drp] | Freq: Every day | OPHTHALMIC | Status: DC
  Administered 2018-04-16 – 2018-04-18 (×3): 1 [drp] via OPHTHALMIC
  Filled 2018-04-14 (×2): qty 15

## 2018-04-14 MED ORDER — DEXTROSE 50 % IV SOLN
25.0000 g | Freq: Once | INTRAVENOUS | Status: AC
  Administered 2018-04-14: 25 g via INTRAVENOUS
  Filled 2018-04-14: qty 50

## 2018-04-14 MED ORDER — ARTIFICIAL TEARS OPHTHALMIC OINT
1.0000 "application " | TOPICAL_OINTMENT | Freq: Two times a day (BID) | OPHTHALMIC | Status: DC
  Administered 2018-04-14 – 2018-04-18 (×8): 1 via OPHTHALMIC
  Filled 2018-04-14 (×2): qty 3.5

## 2018-04-14 MED ORDER — METRONIDAZOLE IN NACL 5-0.79 MG/ML-% IV SOLN
500.0000 mg | Freq: Three times a day (TID) | INTRAVENOUS | Status: DC
  Administered 2018-04-14: 500 mg via INTRAVENOUS
  Filled 2018-04-14: qty 100

## 2018-04-14 MED ORDER — SODIUM CHLORIDE 0.9 % IV BOLUS (SEPSIS)
1000.0000 mL | Freq: Once | INTRAVENOUS | Status: AC
  Administered 2018-04-14: 1000 mL via INTRAVENOUS

## 2018-04-14 MED ORDER — SODIUM CHLORIDE 0.9 % IV SOLN
2.0000 g | Freq: Once | INTRAVENOUS | Status: AC
  Administered 2018-04-14: 2 g via INTRAVENOUS
  Filled 2018-04-14: qty 2

## 2018-04-14 MED ORDER — INSULIN ASPART 100 UNIT/ML ~~LOC~~ SOLN
10.0000 [IU] | Freq: Once | SUBCUTANEOUS | Status: AC
  Administered 2018-04-14: 10 [IU] via INTRAVENOUS
  Filled 2018-04-14: qty 1

## 2018-04-14 MED ORDER — RIVASTIGMINE TARTRATE 1.5 MG PO CAPS
1.5000 mg | ORAL_CAPSULE | Freq: Two times a day (BID) | ORAL | Status: DC
  Administered 2018-04-15 – 2018-04-18 (×6): 1.5 mg via ORAL
  Filled 2018-04-14 (×8): qty 1

## 2018-04-14 MED ORDER — FLUTICASONE PROPIONATE 50 MCG/ACT NA SUSP
2.0000 | Freq: Every day | NASAL | Status: DC
  Administered 2018-04-15 – 2018-04-18 (×4): 2 via NASAL
  Filled 2018-04-14: qty 16

## 2018-04-14 MED ORDER — ONDANSETRON HCL 4 MG PO TABS: 4.0000 mg | ORAL_TABLET | Freq: Four times a day (QID) | ORAL | Status: DC | PRN

## 2018-04-14 MED ORDER — RISPERIDONE 1 MG PO TABS
1.0000 mg | ORAL_TABLET | Freq: Two times a day (BID) | ORAL | Status: DC
  Administered 2018-04-15 – 2018-04-18 (×6): 1 mg via ORAL
  Filled 2018-04-14 (×9): qty 1

## 2018-04-14 MED ORDER — ACETAMINOPHEN 325 MG PO TABS
650.0000 mg | ORAL_TABLET | Freq: Four times a day (QID) | ORAL | Status: DC | PRN
  Administered 2018-04-17: 650 mg via ORAL
  Filled 2018-04-14: qty 2

## 2018-04-14 MED ORDER — SODIUM BICARBONATE 8.4 % IV SOLN
50.0000 meq | Freq: Once | INTRAVENOUS | Status: AC
  Administered 2018-04-14: 50 meq via INTRAVENOUS
  Filled 2018-04-14: qty 50

## 2018-04-14 MED ORDER — VANCOMYCIN HCL IN DEXTROSE 1-5 GM/200ML-% IV SOLN
1000.0000 mg | Freq: Once | INTRAVENOUS | Status: AC
  Administered 2018-04-14: 1000 mg via INTRAVENOUS
  Filled 2018-04-14: qty 200

## 2018-04-14 MED ORDER — LEVOFLOXACIN IN D5W 500 MG/100ML IV SOLN
500.0000 mg | INTRAVENOUS | Status: DC
  Filled 2018-04-14: qty 100

## 2018-04-14 MED ORDER — HEPARIN SODIUM (PORCINE) 5000 UNIT/ML IJ SOLN
5000.0000 [IU] | Freq: Three times a day (TID) | INTRAMUSCULAR | Status: DC
  Administered 2018-04-14 – 2018-04-18 (×11): 5000 [IU] via SUBCUTANEOUS
  Filled 2018-04-14 (×12): qty 1

## 2018-04-14 MED ORDER — SODIUM CHLORIDE 0.9 % IV SOLN
1.0000 g | Freq: Once | INTRAVENOUS | Status: AC
  Administered 2018-04-14: 1 g via INTRAVENOUS
  Filled 2018-04-14: qty 10

## 2018-04-14 MED ORDER — SODIUM CHLORIDE 0.9 % IV SOLN
Freq: Once | INTRAVENOUS | Status: AC
  Administered 2018-04-14: 14:00:00 via INTRAVENOUS

## 2018-04-14 MED ORDER — SODIUM CHLORIDE 0.9 % IV SOLN
INTRAVENOUS | Status: DC
  Administered 2018-04-14 – 2018-04-15 (×2): via INTRAVENOUS

## 2018-04-14 MED ORDER — INSULIN ASPART 100 UNIT/ML ~~LOC~~ SOLN: 0.0000 [IU] | Freq: Every day | SUBCUTANEOUS | Status: DC

## 2018-04-14 NOTE — ED Triage Notes (Signed)
PT arrived via [facility] for complaint of hypotension. BP for ems was 77/49, currently 92/72. Recent UTI, antibiotic therapy complete. CBG for ems 125. Pt refused morning meds at [facility].

## 2018-04-14 NOTE — ED Notes (Signed)
Pt now moving and saying words RN "how are you feeling" Ms. Tierce "wonderful"

## 2018-04-14 NOTE — ED Notes (Signed)
Lab called and stated that specimen had hemolyzed - advised lab to come and draw specimen

## 2018-04-14 NOTE — Progress Notes (Signed)
Pharmacy Antibiotic Note  Tiffany Turner is a 77 y.o. female admitted on 04/14/2018 with sepsis Pharmacy has been consulted for Levaquin dosing. PMH includes CAD, hyperlipidemia, HTN, dementia. She is noted to have a recent UTI. On this visit UA shows likely UTI and imaging is concerning for possible left lower lobe pneumonia. Additionally, she is in acute renal failure.  Plan: Levaquin 750mg  IV once then 500mg  IV every every 48 hours  Weight: 105 lb 13.1 oz (48 kg)  Temp (24hrs), Avg:100.4 F (38 C), Min:98.7 F (37.1 C), Max:102 F (38.9 C)  Recent Labs  Lab 04/14/18 1058  WBC 9.9  LATICACIDVEN 1.0    CrCl cannot be calculated (Unknown ideal weight.).    Allergies  Allergen Reactions  . Penicillins Other (See Comments)    Has patient had a PCN reaction causing immediate rash, facial/tongue/throat swelling, SOB or lightheadedness with hypotension: Unknown Has patient had a PCN reaction causing severe rash involving mucus membranes or skin necrosis: Unknown Has patient had a PCN reaction that required hospitalization: Unknown Has patient had a PCN reaction occurring within the last 10 years: Unknown If all of the above answers are "NO", then may proceed with Cephalosporin use.  . Sulfa Antibiotics Other (See Comments)    Antimicrobials this admission: Vancomycin  10/9 x1 azttreonam 10/9 x1 Flagyl 10/9 x1 Levaquin 10/9 >>  Microbiology results: 10/9 BCx: pending 10/9 UCx: pending   Thank you for allowing pharmacy to be a part of this patient's care.  Lowella Bandy, PharmD 04/14/2018 2:56 PM

## 2018-04-14 NOTE — ED Provider Notes (Signed)
[facility] Emergency Department Provider Note       Time seen: ----------------------------------------- 10:58 AM on 04/14/2018 -----------------------------------------   I have reviewed the triage vital signs and the nursing notes.  HISTORY   Chief Complaint No chief complaint on file.  Level V caveat: History/ROS limited by altered mental status  HPI Tiffany Turner is a 77 y.o. female with a history of coronary artery disease, hyperlipidemia, hypertension, dementia who presents to the ED for altered mental status and possible sepsis.  She does have a history of dementia and cannot give further review of systems or report.  Recently it has been reported that she has been treated for UTI.  No further information is available.  Past Medical History:  Diagnosis Date  . Coronary artery disease    stents placed  . Hip dislocation, right (HCC)    hx of dislocation  . Hyperlipemia   . Hypertension     Patient Active Problem List   Diagnosis Date Noted  . 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 Dementia, Alzheimer's, with behavior disturbance (HCC) 02/16/2015  . Hypertension 02/16/2015    Past Surgical History:  Procedure Laterality Date  . CARDIAC SURGERY     stents  . CORONARY ANGIOPLASTY WITH STENT PLACEMENT      Allergies Penicillins and Sulfa antibiotics  Social History Social History   Tobacco Use  . Smoking status: Current Every Day Smoker    Packs/day: 0.50    Types: Cigarettes  . Smokeless tobacco: Never Used  Substance Use Topics  . Alcohol use: No  . Drug use: No   Review of Systems Unknown, patient has dementia and potentially altered mental status  All systems negative/normal/unremarkable except as stated in the HPI  ____________________________________________   PHYSICAL EXAM:  VITAL SIGNS: ED Triage Vitals  Enc Vitals Group     BP      Pulse      Resp       Temp      Temp src      SpO2      Weight      Height      Head Circumference      Peak Flow      Pain Score      Pain Loc      Pain Edu?      Excl. in GC?    Constitutional: Lethargic, no obvious distress Eyes: Conjunctivae are normal. ENT   Head: Normocephalic and atraumatic.   Nose: No congestion/rhinnorhea.   Mouth/Throat: Mucous membranes are moist.   Neck: No stridor. Cardiovascular: Normal rate, regular rhythm. No murmurs, rubs, or gallops. Respiratory: Normal respiratory effort without tachypnea nor retractions.   Slight wheezing bilaterally Gastrointestinal: Soft and nontender. Normal bowel sounds Musculoskeletal: Limited range of motion of the extremities but nontender, no obvious edema Neurologic: Patient is poorly responsive and lethargic at this point. Skin:  Skin is warm, dry and intact. No rash noted. ____________________________________________  EKG: Interpreted by me.  Sinus rhythm the rate of 99 bpm, normal PR interval, normal QRS, normal QT  ____________________________________________  ED COURSE:  As part of my medical decision making, I reviewed the following data within the electronic MEDICAL RECORD NUMBER History obtained from family if available, nursing notes, old chart and ekg, as well as notes from prior ED visits. Patient presented for altered mental status with possible code sepsis, we will assess with labs and imaging as indicated at  this time. Clinical Course as of Apr 14 1402  Wed Apr 14, 2018  1340 Bladder scan performed with an average of 500 cc.  We will catheterize her.   [JW]    Clinical Course User Index [JW] Emily Filbert, MD   Procedures ____________________________________________   LABS (pertinent positives/negatives)  Labs Reviewed  CBC WITH DIFFERENTIAL/PLATELET - Abnormal; Notable for the following components:      Result Value   RBC 2.78 (*)    Hemoglobin 9.7 (*)    HCT 30.1 (*)    MCV 108.3 (*)    MCH  34.9 (*)    All other components within normal limits  URINALYSIS, COMPLETE (UACMP) WITH MICROSCOPIC - Abnormal; Notable for the following components:   APPearance TURBID (*)    Glucose, UA   (*)    Value: TEST NOT REPORTED DUE TO COLOR INTERFERENCE OF URINE PIGMENT   Hgb urine dipstick   (*)    Value: TEST NOT REPORTED DUE TO COLOR INTERFERENCE OF URINE PIGMENT   Bilirubin Urine   (*)    Value: TEST NOT REPORTED DUE TO COLOR INTERFERENCE OF URINE PIGMENT   Ketones, ur   (*)    Value: TEST NOT REPORTED DUE TO COLOR INTERFERENCE OF URINE PIGMENT   Protein, ur   (*)    Value: TEST NOT REPORTED DUE TO COLOR INTERFERENCE OF URINE PIGMENT   Nitrite   (*)    Value: TEST NOT REPORTED DUE TO COLOR INTERFERENCE OF URINE PIGMENT   Leukocytes, UA   (*)    Value: TEST NOT REPORTED DUE TO COLOR INTERFERENCE OF URINE PIGMENT   RBC / HPF >50 (*)    WBC, UA >50 (*)    Bacteria, UA MANY (*)    All other components within normal limits  CULTURE, BLOOD (ROUTINE X 2)  CULTURE, BLOOD (ROUTINE X 2)  URINE CULTURE  LACTIC ACID, PLASMA  LACTIC ACID, PLASMA  BLOOD GAS, VENOUS  AMMONIA  COMPREHENSIVE METABOLIC PANEL  TROPONIN I   CRITICAL CARE Performed by: Ulice Dash   Total critical care time: 60 minutes  Critical care time was exclusive of separately billable procedures and treating other patients.  Critical care was necessary to treat or prevent imminent or life-threatening deterioration.  Critical care was time spent personally by me on the following activities: development of treatment plan with patient and/or surrogate as well as nursing, discussions with consultants, evaluation of patient's response to treatment, examination of patient, obtaining history from patient or surrogate, ordering and performing treatments and interventions, ordering and review of laboratory studies, ordering and review of radiographic studies, pulse oximetry and re-evaluation of patient's  condition.  RADIOLOGY Images were viewed by me  Chest x-ray IMPRESSION: 1. Hazy left lower lobe airspace disease concerning for pneumonia. Followup PA and lateral chest X-ray is recommended in 3-4 weeks following trial of antibiotic therapy to ensure resolution and exclude underlying malignancy. ____________________________________________  DIFFERENTIAL DIAGNOSIS   Dehydration, sepsis, UTI, pneumonia, CVA, medication side effect, dementia  FINAL ASSESSMENT AND PLAN  Altered mental status, UTI with possible urosepsis, possible left lower lobe pneumonia, acute renal failure   Plan: The patient had presented for possible sepsis. Patient's labs revealed multiple abnormalities including acute renal failure, hyperkalemia, and UTI.  We started her on fluids as well as broad-spectrum antibiotics.  We also placed a Foley catheter because she was retaining over 500 cc of urine on multiple checks. Patient's imaging was concerning for possible left lower lobe  pneumonia.  Her vital signs have been stable and I have kept this on up-to-date with the plans.  Currently she is still full code.   Ulice Dash, MD   Note: This note was generated in part or whole with voice recognition software. Voice recognition is usually quite accurate but there are transcription errors that can and very often do occur. I apologize for any typographical errors that were not detected and corrected.     Emily Filbert, MD 04/14/18 [phone]

## 2018-04-14 NOTE — H&P (Signed)
[facility] [place] [facility]   PATIENT NAME: Tiffany Turner    MR#:  [id number]  DATE OF BIRTH:  Jun 27, 1941  DATE OF ADMISSION:  04/14/2018  PRIMARY CARE PHYSICIAN:  [facility]  REQUESTING/REFERRING PHYSICIAN: Dr Presley Raddle  CHIEF COMPLAINT:   Chief Complaint  Patient presents with  . Code Sepsis    HISTORY OF PRESENT ILLNESS:  Tiffany Turner  is a 77 y.o. female with a known history of Dementia.  Patient unable to give any information at this time.  As per son, she is normally able to remember things and have a conversation and eat.  Right now the patient is unable to give any history at this time.  Only able to say a few words.  Hospitalist services were contacted for admission for sepsis.  PAST MEDICAL HISTORY:   Past Medical History:  Diagnosis Date  . Coronary artery disease    stents placed  . Dementia (HCC)   . Hip dislocation, right (HCC)    hx of dislocation  . Hyperlipemia   . Hypertension     PAST SURGICAL HISTORY:   Past Surgical History:  Procedure Laterality Date  . CARDIAC SURGERY     stents  . CORONARY ANGIOPLASTY WITH STENT PLACEMENT    . TOTAL HIP ARTHROPLASTY Right     SOCIAL HISTORY:   Social History   Tobacco Use  . Smoking status: Current Every Day Smoker    Packs/day: 0.50    Types: Cigarettes  . Smokeless tobacco: Never Used  Substance Use Topics  . Alcohol use: No    FAMILY HISTORY:   Family History  Problem Relation Age of Onset  . CAD Father     DRUG ALLERGIES:   Allergies  Allergen Reactions  . Penicillins Other (See Comments)    Has patient had a PCN reaction causing immediate rash, facial/tongue/throat swelling, SOB or lightheadedness with hypotension: Unknown Has patient had a PCN reaction causing severe rash involving mucus membranes or skin necrosis: Unknown Has patient had a PCN reaction that required hospitalization: Unknown Has patient had a PCN  reaction occurring within the last 10 years: Unknown If all of the above answers are "NO", then may proceed with Cephalosporin use.  . Sulfa Antibiotics Other (See Comments)    REVIEW OF SYSTEMS:  Unable to provide any review of systems secondary to sepsis and altered mental status.  MEDICATIONS AT HOME:   Prior to Admission medications   Medication Sig Start Date End Date Taking? Authorizing Provider  acetaminophen (TYLENOL) 500 MG tablet Take 500 mg by mouth every 4 (four) hours as needed for fever or headache.   Yes [provider]  alum & mag hydroxide-simeth (MINTOX REGULAR STRENGTH) 200-200-20 MG/5ML suspension Take 30 mLs by mouth every 6 (six) hours as needed for indigestion or heartburn.   Yes [provider]  Artificial Tear Ointment (WH PETROL-MINERAL OIL-LANOLIN) 0.1-0.1 % OINT Place 0.5 inches into the right eye 2 (two) times daily. 03/31/18  Yes Sharman Cheek, MD  aspirin 81 MG tablet Take 81 mg by mouth daily.    Yes [provider]  atorvastatin (LIPITOR) 40 MG tablet Take 40 mg by mouth daily.   Yes [provider]  diclofenac sodium (VOLTAREN) 1 % GEL Apply 2 g topically 2 (two) times daily.   Yes [provider]  enalapril (VASOTEC) 20 MG tablet Take 20 mg by mouth daily.    Yes [provider]  fluticasone (FLONASE) 50  MCG/ACT nasal spray Place 2 sprays into both nostrils daily.    Yes [provider]  guaifenesin (ROBITUSSIN) 100 MG/5ML syrup Take 200 mg by mouth every 6 (six) hours as needed for cough.   Yes [provider]  ibuprofen (ADVIL,MOTRIN) 400 MG tablet Take 400 mg by mouth 3 (three) times daily.    Yes [provider]  loperamide (IMODIUM) 2 MG capsule Take 2 mg by mouth as needed for diarrhea or loose stools (max 8 doses daily).   Yes [provider]  LORazepam (ATIVAN) 0.5 MG tablet Take 0.5 mg by mouth 2 (two) times daily as needed for anxiety.   Yes [provider]  magnesium hydroxide (MILK OF MAGNESIA) 400 MG/5ML suspension Take 30 mLs by mouth daily as needed for mild constipation.   Yes [provider]  metoprolol succinate (TOPROL-XL) 50 MG 24 hr tablet Take 50 mg by mouth daily. Take with or immediately following a meal.   Yes [provider]  Morphine Sulfate (MORPHINE CONCENTRATE) 10 mg / 0.5 ml concentrated solution Take 5 mg by mouth every hour as needed for severe pain, anxiety or shortness of breath.   Yes [provider]  neomycin-bacitracin-polymyxin (NEOSPORIN) OINT Apply 1 application topically as needed for wound care.   Yes [provider]  Polyethyl Glycol-Propyl Glycol (SYSTANE) 0.4-0.3 % SOLN Place 1 drop into both eyes daily.   Yes [provider]  risperiDONE (RISPERDAL) 1 MG tablet Take 1 mg by mouth 2 (two) times daily.   Yes [provider]  rivastigmine (EXELON) 1.5 MG capsule Take 1.5 mg by mouth 2 (two) times daily.   Yes [provider]  tetrahydrozoline 0.05 % ophthalmic solution Place 3 drops into both eyes at bedtime.   Yes [provider]  traMADol (ULTRAM) 50 MG tablet Take 50 mg by mouth 3 (three) times daily.   Yes [provider]  traZODone (DESYREL) 50 MG tablet Take 50 mg by mouth at bedtime.   Yes [provider]  Valproate Sodium (DEPAKENE) 250 MG/5ML SOLN solution Take 250 mg by mouth 2 (two) times daily.   Yes [provider]      VITAL SIGNS:  Blood pressure (!) 121/59, pulse (!) 113, temperature 98.7 F (37.1 C), temperature source Rectal, resp. rate 19, weight 48 kg, SpO2 99 %.  PHYSICAL EXAMINATION:  GENERAL:  77 y.o.-year-old patient lying in the bed with no acute distress.  EYES: Pupils equal, round, reactive to light and accommodation. No scleral icterus.  HEENT: Head atraumatic, normocephalic. Oropharynx and nasopharynx clear.  NECK:  Supple, no jugular venous distention. No thyroid  enlargement, no tenderness.  LUNGS: Decreased breath sounds bilaterally, no wheezing, rales,rhonchi or crepitation. No use of accessory muscles of respiration.  CARDIOVASCULAR: S1, S2 tachycardic. No murmurs, rubs, or gallops.  ABDOMEN: Soft, nontender, nondistended. Bowel sounds present. No organomegaly or mass.  EXTREMITIES: No pedal edema, cyanosis, or clubbing.  NEUROLOGIC: Babinski negative.  Difficult to assess secondary to altered mental status gait not checked.  PSYCHIATRIC: The patient is lethargic.  SKIN: No rash, lesion, or ulcer.   Urine in Foley catheter almost looks like a dark milk consistency  LABORATORY PANEL:   CBC Recent Labs  Lab 04/14/18 1058  WBC 9.9  HGB 9.7*  HCT 30.1*  PLT 356   ------------------------------------------------------------------------------------------------------------------  Chemistries  Still pending  RADIOLOGY:  Dg Chest Port 1 View  Result Date: 04/14/2018 CLINICAL DATA:  Fever EXAM: PORTABLE CHEST 1 VIEW  COMPARISON:  01/25/2018 FINDINGS: There is hazy left lower lobe airspace disease concerning for pneumonia. There is no other focal parenchymal opacity. There is mild bilateral chronic interstitial thickening. There is no pleural effusion or pneumothorax. The heart and mediastinal contours are unremarkable. The osseous structures are unremarkable. IMPRESSION: 1. Hazy left lower lobe airspace disease concerning for pneumonia. Followup PA and lateral chest X-ray is recommended in 3-4 weeks following trial of antibiotic therapy to ensure resolution and exclude underlying malignancy. Electronically Signed   By: Elige Ko   On: 04/14/2018 12:37    EKG:   Sinus rhythm 99 bpm no acute ST-T wave changes  IMPRESSION AND PLAN:   1.  Clinical sepsis, acute cystitis, pneumonia, fever, tachycardia and altered mental status.  IV fluid hydration.  N.p.o. patient was given vancomycin and aztreonam and Flagyl in the emergency room.  I will switch  over the Levaquin which should cover pneumonia and urine.  Patient has an allergy to penicillins and sulfa.  Swallow evaluation.  Palliative care evaluation. 2.  Dementia history. 3.  Essential hypertension.  Current blood pressure okay.  Continue to monitor. 4.  History of CAD.  Rectal aspirin will be given. 5.  Hyperlipidemia unspecified.  Holding Lipitor until I am sure she is able to eat.    All the records are reviewed and case discussed with ED provider. Management plans discussed with the patient, family and they are in agreement.  CODE STATUS: Full code  TOTAL TIME TAKING CARE OF THIS PATIENT: 50 minutes.    Alford Highland M.D on 04/14/2018 at 3:16 PM  Between 7am to 6pm - Pager - [phone]  After 6pm call admission pager [phone]  [facility]  [phone]  CC: Primary care physician; doctors making house calls

## 2018-04-14 NOTE — ED Notes (Signed)
Bladder scan results approx. 504 mL Dr. Mayford Knife notified Order given for Foley catheter

## 2018-04-14 NOTE — ED Notes (Signed)
EDP Williams made aware of critical results potassium of 6.9 and troponin of 0.06. BMP being sent and run again

## 2018-04-14 NOTE — ED Notes (Signed)
BMP redrawn

## 2018-04-14 NOTE — Clinical Social Work Note (Addendum)
Clinical Social Work Assessment  Patient Details  Name: Tiffany Turner MRN: [id number] Date of Birth: 1941/04/01  Date of referral:  04/14/18               Reason for consult:  Other (Comment Required)(From [facility] )                Permission sought to share information with:    Permission granted to share information::     Name::        Agency::     Relationship::     Contact Information:     Housing/Transportation Living arrangements for the past 2 months:  Assisted Living Facility Source of Information:  Facility, Other (Comment Required)([facility] ) Patient Interpreter Needed:  None Criminal Activity/Legal Involvement Pertinent to Current Situation/Hospitalization:  No - Comment as needed Significant Relationships:  Adult Turner Lives with:  Facility Resident Do you feel safe going back to the place where you live?    Need for family participation in patient care:  Yes (Comment)  Care giving concerns:  Patient is a resident at [facility].    Social Worker assessment / plan:  Visual merchandiser (CSW) reviewed chart and noted that patient is from [facility]. Per chart [facility] Turner has guardianship over patient. CSW contacted [facility] Turner worker who stated that Turner no longer has guardianship and patient's son Tiffany Turner [phone] now has guardianship. CSW attempted to contact [place] however he did not answer and his voicemail was full. CSW contacted [facility] and spoke to the business [facility] and was told Tiffany Turner is patient's guardian and patient is in memory care. Per business [facility]iffany Turner the resident care Investment banker, corporate at [facility] will have to discuss D/C plan with CSW. CSW left message with care manager and resident care coordinator to call CSW back. Per chart patient is not alert and oriented.   Late Entry 04/15/18 8:38 am: Patient's  son Tiffany Turner called Clinical Social Worker (CSW) back on 04/14/18 after 5 pm. Per Tiffany Turner he has been patient's legal guardian for 1 year now. Per Tiffany Turner he is an Librarian, academic in the community. 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 Per Tiffany Turner she can transfer herself to the wheel chair. Per Tiffany Turner patient started hospice last week and believes it is [facility]. [facility] liaison reported that patient is not open with their agency. Per Tiffany Turner he asked the ALF to transfer patient to the hospital because he was afraid she was septic. Per Tiffany Turner the ALF has a copy of the guardianship paper work. CSW will continue to follow and assist as needed.   Employment status:  Disabled (Comment on whether or not currently receiving Disability), Retired Health and safety inspector:  Medicare, Medicaid In [place] PT Recommendations:  Not assessed at this time Information / Referral to community resources:  Other (Comment Required)(Patient will return to ALF )  Patient/Family's Response to care:  Patient's son/ guardian is agreeable to treatment at [facility].   Patient/Family's Understanding of and Emotional Response to Diagnosis, Current Treatment, and Prognosis:  Patient's son/guardian was very pleasant and thanked CSW for assistance.   Emotional Assessment Appearance:  Appears stated age Attitude/Demeanor/Rapport:  Unable to Assess Affect (typically observed):  Unable to Assess Orientation:  Oriented to Self, Fluctuating Orientation (  Suspected and/or reported Sundowners) Alcohol / Substance use:  Not Applicable Psych involvement (Current and /or in the community):  No (Comment)  Discharge Needs  Concerns to be addressed:  Discharge Planning Concerns Readmission within the last 30 days:  No Current discharge risk:  Cognitively Impaired, Chronically ill Barriers to Discharge:  Continued Medical Work up   [employer], Tiffany Crocker,  Tiffany Turner 04/14/2018, 4:40 PM

## 2018-04-14 NOTE — Progress Notes (Signed)
CODE SEPSIS - PHARMACY COMMUNICATION  **Broad Spectrum Antibiotics should be administered within 1 hour of Sepsis diagnosis**  Time Code Sepsis Called/Page Received: 1052  Antibiotics Ordered: Aztreonam/Vancomycin  Time of 1st antibiotic administration: Aztreonam 1136   Additional action taken by pharmacy: none indicated   If necessary, Name of Provider/Nurse Contacted: N/A    Sylina Henion L ,PharmD Clinical Pharmacist  04/14/2018  11:58 AM

## 2018-04-14 NOTE — Progress Notes (Signed)
Arrived to ED to draw ABG, ED MD stated ABG not needed, do not draw

## 2018-04-14 NOTE — ED Notes (Signed)
Lab in room attempting to get blood

## 2018-04-14 NOTE — ED Notes (Signed)
Lab called and stated that they were sending another phlebotomist to draw blood again

## 2018-04-14 NOTE — Progress Notes (Signed)
Patient ID: Tiffany Turner, female   DOB: 05-30-1941, 77 y.o.   MRN: [id number]  Chemistry came back and creatinine is up at 3.5.  This is acute kidney injury on chronic kidney disease.  Increase IV fluids to 75 cc an hour. Hyperkalemia likely secondary to acute kidney injury.  IV fluid should help.  Patient received calcium in the ED.  Dr. Alford Highland

## 2018-04-14 NOTE — ED Notes (Signed)
[facility] called to obtain emergency contact Scharlene Catalina (son) # [phone]

## 2018-04-14 NOTE — [place] FL2 (Addendum)
[place] MEDICAID FL2 LEVEL OF CARE SCREENING TOOL     IDENTIFICATION  Patient Name: Tiffany Turner Birthdate: 15-May-1941 Sex: female Admission Date (Current Location): 04/14/2018  [facility] and [place] Number:  [address] ([id number] [facility]) Facility and Address:  [facility], [address], [place], [place] [address]      Provider Number: [id number]  Attending Physician Name and Address:  Alford Highland, MD  Relative Name and Phone Number:       Current Level of Care: Hospital Recommended Level of Care: Assisted Living Facility, Memory Care Prior Approval Number:    Date Approved/Denied:   PASRR Number: [id number])  Discharge Plan: Domiciliary (Rest home)(Memory Care )    Current Diagnoses: Primary: Alzheimer's Dementia Patient Active Problem List   Diagnosis Date Noted  . Acute cystitis 04/14/2018  . 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 Dementia, Alzheimer's, with behavior disturbance (HCC) 02/16/2015  . Hypertension 02/16/2015    Orientation RESPIRATION BLADDER Height & Weight     Self  O2(2 Liters Oxygen. ) Continent Weight: 105 lb 13.1 oz (48 kg) Height:     BEHAVIORAL SYMPTOMS/MOOD NEUROLOGICAL BOWEL NUTRITION STATUS      Continent Diet(NPO to be advanced. )  AMBULATORY STATUS COMMUNICATION OF NEEDS Skin   Limited Assist Verbally Normal                       Personal Care Assistance Level of Assistance  Bathing, Feeding, Dressing Bathing Assistance: Limited assistance Feeding assistance: Independent Dressing Assistance: Limited assistance     Functional Limitations Info  Sight, Hearing, Speech Sight Info: Adequate Hearing Info: Adequate Speech Info: Adequate    SPECIAL CARE FACTORS FREQUENCY  PT (By licensed PT)     PT Frequency: (2-3 home health. )              Contractures      Additional Factors Info  Code Status, Allergies Code Status Info: (Full  Code. ) Allergies Info: (Penicillins, Sulfa Antibiotics)           Current Medications (04/14/2018):  This is the current hospital active medication list Current Facility-Administered Medications  Medication Dose Route Frequency Provider Last Rate Last Dose  . 0.9 %  sodium chloride infusion   Intravenous Continuous Alford Highland, MD 75 mL/hr at 04/14/18 1631    . acetaminophen (TYLENOL) tablet 650 mg  650 mg Oral Q6H PRN Alford Highland, MD       Or  . acetaminophen (TYLENOL) suppository 650 mg  650 mg Rectal Q6H PRN Wieting, Richard, MD      . artificial tears (LACRILUBE) ophthalmic ointment 1 application  1 application Right Eye BID Wieting, Richard, MD      . aspirin suppository 300 mg  300 mg Rectal Daily Wieting, Richard, MD      . fluticasone (FLONASE) 50 MCG/ACT nasal spray 2 spray  2 spray Each Nare Daily Wieting, Richard, MD      . heparin injection 5,000 Units  5,000 Units Subcutaneous Q8H Wieting, Richard, MD      . insulin aspart (novoLOG) injection 0-5 Units  0-5 Units Subcutaneous QHS Wieting, Richard, MD      . insulin aspart (novoLOG) injection 0-9 Units  0-9 Units Subcutaneous TID WC Wieting, Richard, MD      . Melene Muller ON 04/16/2018] levofloxacin (LEVAQUIN) IVPB 500 mg  500 mg Intravenous Q48H Wieting, Richard, MD      . levofloxacin [facility]) IVPB  750 mg  750 mg Intravenous Once Alford Highland, MD      . morphine CONCENTRATE 10 MG/0.5ML oral solution 5 mg  5 mg Oral Q1H PRN Wieting, Richard, MD      . naphazoline-glycerin (CLEAR EYES REDNESS) ophth solution 2 drop  2 drop Both Eyes QHS Wieting, Richard, MD      . ondansetron [facility]) tablet 4 mg  4 mg Oral Q6H PRN Alford Highland, MD       Or  . ondansetron (ZOFRAN) injection 4 mg  4 mg Intravenous Q6H PRN Alford Highland, MD      . Melene Muller ON 04/15/2018] polyvinyl alcohol (LIQUIFILM TEARS) 1.4 % ophthalmic solution 1 drop  1 drop Both Eyes Daily Wieting, Richard, MD      . risperiDONE (RISPERDAL) tablet 1 mg  1  mg Oral BID Alford Highland, MD      . Melene Muller ON 04/15/2018] rivastigmine (EXELON) capsule 1.5 mg  1.5 mg Oral BID Alford Highland, MD         Discharge Medications: STOP taking these medications   ibuprofen 400 MG tablet Commonly known as:  ADVIL,MOTRIN     TAKE these medications   acetaminophen 500 MG tablet Commonly known as:  TYLENOL Take 500 mg by mouth every 4 (four) hours as needed for fever or headache.   aspirin 81 MG tablet Take 81 mg by mouth daily.   atorvastatin 40 MG tablet Commonly known as:  LIPITOR Take 40 mg by mouth daily.   diclofenac sodium 1 % Gel Commonly known as:  VOLTAREN Apply 2 g topically 2 (two) times daily.   enalapril 5 MG tablet Commonly known as:  VASOTEC Take 1 tablet (5 mg total) by mouth daily. What changed:    medication strength  how much to take   fluticasone 50 MCG/ACT nasal spray Commonly known as:  FLONASE Place 2 sprays into both nostrils daily.   guaifenesin 100 MG/5ML syrup Commonly known as:  ROBITUSSIN Take 200 mg by mouth every 6 (six) hours as needed for cough.   levofloxacin 750 MG tablet Commonly known as:  LEVAQUIN Take 1 tablet (750 mg total) by mouth every other day.   loperamide 2 MG capsule Commonly known as:  IMODIUM Take 2 mg by mouth as needed for diarrhea or loose stools (max 8 doses daily).   LORazepam 0.5 MG tablet Commonly known as:  ATIVAN Take 0.5 mg by mouth 2 (two) times daily as needed for anxiety.   magnesium hydroxide 400 MG/5ML suspension Commonly known as:  MILK OF MAGNESIA Take 30 mLs by mouth daily as needed for mild constipation.   metoprolol succinate 25 MG 24 hr tablet Commonly known as:  TOPROL-XL Take 1 tablet (25 mg total) by mouth daily. Take with or immediately following a meal. What changed:    medication strength  how much to take   MINTOX REGULAR STRENGTH 200-200-20 MG/5ML suspension Generic drug:  alum & mag hydroxide-simeth Take 30 mLs by mouth  every 6 (six) hours as needed for indigestion or heartburn.   morphine CONCENTRATE 10 mg / 0.5 ml concentrated solution Take 5 mg by mouth every hour as needed for severe pain, anxiety or shortness of breath.   neomycin-bacitracin-polymyxin Oint Commonly known as:  NEOSPORIN Apply 1 application topically as needed for wound care.   risperiDONE 1 MG tablet Commonly known as:  RISPERDAL Take 1 mg by mouth 2 (two) times daily.   rivastigmine 1.5 MG capsule Commonly known as:  EXELON Take  1.5 mg by mouth 2 (two) times daily.   SYSTANE 0.4-0.3 % Soln Generic drug:  Polyethyl Glycol-Propyl Glycol Place 1 drop into both eyes daily.   tetrahydrozoline 0.05 % ophthalmic solution Place 3 drops into both eyes at bedtime.   traMADol 50 MG tablet Commonly known as:  ULTRAM Take 50 mg by mouth 3 (three) times daily.   traZODone 50 MG tablet Commonly known as:  DESYREL Take 50 mg by mouth at bedtime.   Valproate Sodium 250 MG/5ML Soln solution Commonly known as:  DEPAKENE Take 250 mg by mouth 2 (two) times daily.   Wh Petrol-Mineral Oil-Lanolin 0.1-0.1 % Oint Place 0.5 inches into the right eye 2 (two) times daily.    Relevant Imaging Results:  Relevant Lab Results:   Additional Information (SSN: [id number])  Sample, Darleen Crocker, LCSW

## 2018-04-15 DIAGNOSIS — Z515 Encounter for palliative care: Secondary | ICD-10-CM

## 2018-04-15 DIAGNOSIS — F0390 Unspecified dementia without behavioral disturbance: Secondary | ICD-10-CM

## 2018-04-15 DIAGNOSIS — Z7189 Other specified counseling: Secondary | ICD-10-CM

## 2018-04-15 DIAGNOSIS — N179 Acute kidney failure, unspecified: Secondary | ICD-10-CM

## 2018-04-15 DIAGNOSIS — F03A0 Unspecified dementia, mild, without behavioral disturbance, psychotic disturbance, mood disturbance, and anxiety: Secondary | ICD-10-CM

## 2018-04-15 DIAGNOSIS — N3000 Acute cystitis without hematuria: Secondary | ICD-10-CM

## 2018-04-15 DIAGNOSIS — R4182 Altered mental status, unspecified: Secondary | ICD-10-CM

## 2018-04-15 LAB — CBC
HCT: 27.4 % — ABNORMAL LOW (ref 36.0–46.0)
Hemoglobin: 8.4 g/dL — ABNORMAL LOW (ref 12.0–15.0)
MCH: 34.1 pg — ABNORMAL HIGH (ref 26.0–34.0)
MCHC: 30.7 g/dL (ref 30.0–36.0)
MCV: 111.4 fL — ABNORMAL HIGH (ref 80.0–100.0)
Platelets: 289 K/uL (ref 150–400)
RBC: 2.46 MIL/uL — ABNORMAL LOW (ref 3.87–5.11)
RDW: 13.2 % (ref 11.5–15.5)
WBC: 8.1 K/uL (ref 4.0–10.5)
nRBC: 0 % (ref 0.0–0.2)

## 2018-04-15 LAB — MRSA PCR SCREENING: MRSA by PCR: NEGATIVE

## 2018-04-15 LAB — BASIC METABOLIC PANEL WITH GFR
Anion gap: 4 — ABNORMAL LOW (ref 5–15)
BUN: 86 mg/dL — ABNORMAL HIGH (ref 8–23)
CO2: 19 mmol/L — ABNORMAL LOW (ref 22–32)
Calcium: 9 mg/dL (ref 8.9–10.3)
Chloride: 126 mmol/L — ABNORMAL HIGH (ref 98–111)
Creatinine, Ser: 1.68 mg/dL — ABNORMAL HIGH (ref 0.44–1.00)
GFR calc Af Amer: 33 mL/min — ABNORMAL LOW
GFR calc non Af Amer: 28 mL/min — ABNORMAL LOW
Glucose, Bld: 115 mg/dL — ABNORMAL HIGH (ref 70–99)
Potassium: 5.4 mmol/L — ABNORMAL HIGH (ref 3.5–5.1)
Sodium: 149 mmol/L — ABNORMAL HIGH (ref 135–145)

## 2018-04-15 LAB — GLUCOSE, CAPILLARY
Glucose-Capillary: 121 mg/dL — ABNORMAL HIGH (ref 70–99)
Glucose-Capillary: 125 mg/dL — ABNORMAL HIGH (ref 70–99)
Glucose-Capillary: 157 mg/dL — ABNORMAL HIGH (ref 70–99)
Glucose-Capillary: 124 mg/dL — ABNORMAL HIGH (ref 70–99)

## 2018-04-15 MED ORDER — ORAL CARE MOUTH RINSE
15.0000 mL | Freq: Two times a day (BID) | OROMUCOSAL | Status: DC
  Administered 2018-04-16 – 2018-04-18 (×5): 15 mL via OROMUCOSAL

## 2018-04-15 MED ORDER — DIVALPROEX SODIUM 125 MG PO CSDR
250.0000 mg | DELAYED_RELEASE_CAPSULE | Freq: Two times a day (BID) | ORAL | Status: DC
  Administered 2018-04-15 – 2018-04-18 (×6): 250 mg via ORAL
  Filled 2018-04-15 (×7): qty 2

## 2018-04-15 MED ORDER — SODIUM BICARBONATE 8.4 % IV SOLN
INTRAVENOUS | Status: DC
  Administered 2018-04-15 – 2018-04-16 (×2): via INTRAVENOUS
  Filled 2018-04-15 (×3): qty 150

## 2018-04-15 MED ORDER — SODIUM CHLORIDE 0.45 % IV SOLN: INTRAVENOUS | Status: DC

## 2018-04-15 MED ORDER — DIVALPROEX SODIUM 125 MG PO CSDR
250.0000 mg | DELAYED_RELEASE_CAPSULE | Freq: Two times a day (BID) | ORAL | Status: DC
  Filled 2018-04-15 (×2): qty 2

## 2018-04-15 MED ORDER — MORPHINE SULFATE (CONCENTRATE) 10 MG/0.5ML PO SOLN
5.0000 mg | ORAL | Status: DC | PRN
  Administered 2018-04-16: 02:00:00 5 mg via ORAL
  Filled 2018-04-15: qty 1

## 2018-04-15 NOTE — Consult Note (Signed)
[facility]  CONSULT NOTE    Date: 04/15/2018                  Patient Name:  Tiffany Turner  MRN: [id number]  DOB: Jun 30, 1941  Age / Sex: 77 y.o., female         PCP: Patient, No Pcp Per                 Service Requesting Consult: Dr. Darvin Neighbours                 Reason for Consult: Acute Renal Failure            History of Present Illness: Tiffany Turner is a 77 y.o. white female with dementia, coronary artery disease, hypertension, hyperlipidemia, who was admitted to [facility] on 04/14/2018 for Urinary retention [R33.9] Urinary tract infection with hematuria, site unspecified [N39.0, R31.9] Acute renal failure, unspecified acute renal failure type ([place]) [N17.9] Altered mental status, unspecified altered mental status type [R41.82]  Son at bedside. Patient unable to give history due to dementia and delirium.  Has met with palliative care.  Tmax of 102  Patient recently enrolled in hospice at SNF.    Medications: Outpatient medications: Medications Prior to Admission  Medication Sig Dispense Refill Last Dose  . acetaminophen (TYLENOL) 500 MG tablet Take 500 mg by mouth every 4 (four) hours as needed for fever or headache.   Unknown at PRN  . alum & mag hydroxide-simeth (MINTOX REGULAR STRENGTH) 200-200-20 MG/5ML suspension Take 30 mLs by mouth every 6 (six) hours as needed for indigestion or heartburn.   Unknown at PRN  . Artificial Tear Ointment ([place] PETROL-MINERAL OIL-LANOLIN) 0.1-0.1 % OINT Place 0.5 inches into the right eye 2 (two) times daily. 3.5 g 2 04/13/2018 at 1800  . aspirin 81 MG tablet Take 81 mg by mouth daily.    04/13/2018 at 1400  . atorvastatin (LIPITOR) 40 MG tablet Take 40 mg by mouth daily.   04/13/2018 at 0900  . diclofenac sodium (VOLTAREN) 1 % GEL Apply 2 g topically 2 (two) times daily.   04/13/2018 at 0900  . enalapril (VASOTEC) 20 MG tablet Take 20 mg by mouth daily.    04/13/2018 at 1600  . fluticasone (FLONASE) 50 MCG/ACT  nasal spray Place 2 sprays into both nostrils daily.    04/13/2018 at 0600  . guaifenesin (ROBITUSSIN) 100 MG/5ML syrup Take 200 mg by mouth every 6 (six) hours as needed for cough.   Unknown at PRN  . ibuprofen (ADVIL,MOTRIN) 400 MG tablet Take 400 mg by mouth 3 (three) times daily.    04/13/2018 at 2100  . loperamide (IMODIUM) 2 MG capsule Take 2 mg by mouth as needed for diarrhea or loose stools (max 8 doses daily).   Unknown at PRN  . LORazepam (ATIVAN) 0.5 MG tablet Take 0.5 mg by mouth 2 (two) times daily as needed for anxiety.   Unknown at PRN  . magnesium hydroxide (MILK OF MAGNESIA) 400 MG/5ML suspension Take 30 mLs by mouth daily as needed for mild constipation.   Unknown at PRN  . metoprolol succinate (TOPROL-XL) 50 MG 24 hr tablet Take 50 mg by mouth daily. Take with or immediately following a meal.   04/13/2018 at 0900  . Morphine Sulfate (MORPHINE CONCENTRATE) 10 mg / 0.5 ml concentrated solution Take 5 mg by mouth every hour as needed for severe pain, anxiety or shortness of breath.   Unknown at PRN  .  neomycin-bacitracin-polymyxin (NEOSPORIN) OINT Apply 1 application topically as needed for wound care.   Unknown at PRN  . Polyethyl Glycol-Propyl Glycol (SYSTANE) 0.4-0.3 % SOLN Place 1 drop into both eyes daily.   04/13/2018 at 0900  . risperiDONE (RISPERDAL) 1 MG tablet Take 1 mg by mouth 2 (two) times daily.   04/13/2018 at 1800  . rivastigmine (EXELON) 1.5 MG capsule Take 1.5 mg by mouth 2 (two) times daily.   04/13/2018 at 2100  . tetrahydrozoline 0.05 % ophthalmic solution Place 3 drops into both eyes at bedtime.   04/13/2018 at 2100  . traMADol (ULTRAM) 50 MG tablet Take 50 mg by mouth 3 (three) times daily.   04/13/2018 at 2000  . traZODone (DESYREL) 50 MG tablet Take 50 mg by mouth at bedtime.   04/13/2018 at 2100  . Valproate Sodium (DEPAKENE) 250 MG/5ML SOLN solution Take 250 mg by mouth 2 (two) times daily.   04/13/2018 at 1800    Current medications: Current Facility-Administered  Medications  Medication Dose Route Frequency Provider Last Rate Last Dose  . acetaminophen (TYLENOL) tablet 650 mg  650 mg Oral Q6H PRN Loletha Grayer, MD       Or  . acetaminophen (TYLENOL) suppository 650 mg  650 mg Rectal Q6H PRN Loletha Grayer, MD      . artificial tears (LACRILUBE) ophthalmic ointment 1 application  1 application Right Eye BID Loletha Grayer, MD   1 application at 56/81/27 [phone]  . aspirin suppository 300 mg  300 mg Rectal Daily Loletha Grayer, MD   300 mg at 04/15/18 0819  . fluticasone (FLONASE) 50 MCG/ACT nasal spray 2 spray  2 spray Each Nare Daily Loletha Grayer, MD   2 spray at 04/15/18 0825  . heparin injection 5,000 Units  5,000 Units Subcutaneous Q8H Loletha Grayer, MD   5,000 Units at 04/15/18 [phone]  . insulin aspart (novoLOG) injection 0-5 Units  0-5 Units Subcutaneous QHS Wieting, Richard, MD      . insulin aspart (novoLOG) injection 0-9 Units  0-9 Units Subcutaneous TID WC Loletha Grayer, MD   1 Units at 04/15/18 [phone]  . [START ON 04/16/2018] levofloxacin (LEVAQUIN) IVPB 500 mg  500 mg Intravenous Q48H Wieting, Richard, MD      . MEDLINE mouth rinse  15 mL Mouth Rinse BID Sudini, Srikar, MD      . morphine CONCENTRATE 10 MG/0.5ML oral solution 5 mg  5 mg Oral Q1H PRN Wieting, Richard, MD      . naphazoline-glycerin (CLEAR EYES REDNESS) ophth solution 2 drop  2 drop Both Eyes QHS Loletha Grayer, MD   2 drop at 04/15/18 0826  . ondansetron (ZOFRAN) tablet 4 mg  4 mg Oral Q6H PRN Loletha Grayer, MD       Or  . ondansetron (ZOFRAN) injection 4 mg  4 mg Intravenous Q6H PRN Wieting, Richard, MD      . polyvinyl alcohol (LIQUIFILM TEARS) 1.4 % ophthalmic solution 1 drop  1 drop Both Eyes Daily Wieting, Richard, MD      . risperiDONE (RISPERDAL) tablet 1 mg  1 mg Oral BID Wieting, Richard, MD      . rivastigmine (EXELON) capsule 1.5 mg  1.5 mg Oral BID Wieting, Richard, MD      . sodium bicarbonate 150 mEq in dextrose 5 % 1,000 mL infusion   Intravenous  Continuous Anglia Blakley, MD          Allergies: Allergies  Allergen Reactions  . Penicillins Other (See Comments)  Has patient had a PCN reaction causing immediate rash, facial/tongue/throat swelling, SOB or lightheadedness with hypotension: Unknown Has patient had a PCN reaction causing severe rash involving mucus membranes or skin necrosis: Unknown Has patient had a PCN reaction that required hospitalization: Unknown Has patient had a PCN reaction occurring within the last 10 years: Unknown If all of the above answers are "NO", then may proceed with Cephalosporin use.  . Sulfa Antibiotics Other (See Comments)      Past Medical History: Past Medical History:  Diagnosis Date  . Coronary artery disease    stents placed  . Dementia ([place])   . Hip dislocation, right (HCC)    hx of dislocation  . Hyperlipemia   . Hypertension      Past Surgical History: Past Surgical History:  Procedure Laterality Date  . CARDIAC SURGERY     stents  . CORONARY ANGIOPLASTY WITH STENT PLACEMENT    . TOTAL HIP ARTHROPLASTY Right      Family History: Family History  Problem Relation Age of Onset  . CAD Father      Social History: Social History   Socioeconomic History  . Marital status: Single    Spouse name: Not on file  . Number of children: Not on file  . Years of education: Not on file  . Highest education level: Not on file  Occupational History  . Not on file  Social Needs  . Financial resource strain: Not on file  . Food insecurity:    Worry: Not on file    Inability: Not on file  . Transportation needs:    Medical: Not on file    Non-medical: Not on file  Tobacco Use  . Smoking status: Current Every Day Smoker    Packs/day: 0.50    Types: Cigarettes  . Smokeless tobacco: Never Used  Substance and Sexual Activity  . Alcohol use: No  . Drug use: No  . Sexual activity: Not on file  Lifestyle  . Physical activity:    Days per week: Not on file    Minutes  per session: Not on file  . Stress: Not on file  Relationships  . Social connections:    Talks on phone: Not on file    Gets together: Not on file    Attends religious service: Not on file    Active member of club or organization: Not on file    Attends meetings of clubs or organizations: Not on file    Relationship status: Not on file  . Intimate partner violence:    Fear of current or ex partner: Not on file    Emotionally abused: Not on file    Physically abused: Not on file    Forced sexual activity: Not on file  Other Topics Concern  . Not on file  Social History Narrative  . Not on file     Review of Systems: Review of Systems  Unable to perform ROS: Dementia    Vital Signs: Blood pressure (!) 142/87, pulse 76, temperature 98.2 F (36.8 C), temperature source Oral, resp. rate 16, weight 48 kg, SpO2 100 %.  Weight trends: Filed Weights   04/14/18 1115  Weight: 48 kg    Physical Exam: General: NAD, frail, laying in bed  Head: Normocephalic, atraumatic. Moist oral mucosal membranes  Eyes: Anicteric, PERRL  Neck: Supple, trachea midline  Lungs:  Clear to auscultation  Heart: Regular rate and rhythm  Abdomen:  Soft, nontender,   Extremities: no peripheral edema.  Neurologic: Alert to self  Skin: No lesions  GU Foley with clear yellow urine     Lab results: Basic Metabolic Panel: Recent Labs  Lab 04/14/18 1425 04/15/18 0407  NA 143 149*  K 5.6* 5.4*  CL 117* 126*  CO2 16* 19*  GLUCOSE 265* 115*  BUN 118* 86*  CREATININE 3.50* 1.68*  CALCIUM 8.1* 9.0    Liver Function Tests: Recent Labs  Lab 04/14/18 1425  AST 50*  ALT 20  ALKPHOS 62  BILITOT 0.6  PROT 5.9*  ALBUMIN 2.3*   No results for input(s): LIPASE, AMYLASE in the last 168 hours. Recent Labs  Lab 04/14/18 1425  AMMONIA 27    CBC: Recent Labs  Lab 04/14/18 1058 04/15/18 0407  WBC 9.9 8.1  NEUTROABS 7.6  --   HGB 9.7* 8.4*  HCT 30.1* 27.4*  MCV 108.3* 111.4*  PLT 356  289    Cardiac Enzymes: Recent Labs  Lab 04/14/18 1425  TROPONINI 0.05*    BNP: Invalid input(s): POCBNP  CBG: Recent Labs  Lab 04/14/18 1708 04/14/18 2123 04/15/18 0741  GLUCAP 109* 109* 121*    Microbiology: Results for orders placed or performed during the hospital encounter of 04/14/18  Blood Culture (routine x 2)     Status: None (Preliminary result)   Collection Time: 04/14/18 10:58 AM  Result Value Ref Range Status   Specimen Description BLOOD RIGHT HAND  Final   Special Requests   Final    BOTTLES DRAWN AEROBIC AND ANAEROBIC Blood Culture adequate volume   Culture   Final    NO GROWTH < 24 HOURS Performed at [facility], [address] [place], [place] [address]    Report Status PENDING  Incomplete  Blood Culture (routine x 2)     Status: None (Preliminary result)   Collection Time: 04/14/18 11:21 AM  Result Value Ref Range Status   Specimen Description BLOOD LEFT ARM  Final   Special Requests   Final    BOTTLES DRAWN AEROBIC AND ANAEROBIC Blood Culture results may not be optimal due to an excessive volume of blood received in culture bottles   Culture   Final    NO GROWTH < 24 HOURS Performed at [facility], [address], [place], [place] [address]    Report Status PENDING  Incomplete  MRSA PCR Screening     Status: None   Collection Time: 04/15/18  3:39 AM  Result Value Ref Range Status   MRSA by PCR NEGATIVE NEGATIVE Final    Comment:        The GeneXpert MRSA Assay (FDA approved for NASAL specimens only), is one component of a comprehensive MRSA colonization surveillance program. It is not intended to diagnose MRSA infection nor to guide or monitor treatment for MRSA infections. Performed at [facility], [address] [place], [place] [address]     Coagulation Studies: No results for input(s): LABPROT, INR in the last 72 hours.  Urinalysis: Recent Labs    04/14/18 1151  COLORURINE YELLOW  LABSPEC  1.022  PHURINE TEST NOT REPORTED DUE TO COLOR INTERFERENCE OF URINE PIGMENT  GLUCOSEU TEST NOT REPORTED DUE TO COLOR INTERFERENCE OF URINE PIGMENT*  HGBUR TEST NOT REPORTED DUE TO COLOR INTERFERENCE OF URINE PIGMENT*  BILIRUBINUR TEST NOT REPORTED DUE TO COLOR INTERFERENCE OF URINE PIGMENT*  KETONESUR TEST NOT REPORTED DUE TO COLOR INTERFERENCE OF URINE PIGMENT*  PROTEINUR TEST NOT REPORTED DUE TO COLOR INTERFERENCE OF URINE PIGMENT*  NITRITE  TEST NOT REPORTED DUE TO COLOR INTERFERENCE OF URINE PIGMENT*  LEUKOCYTESUR TEST NOT REPORTED DUE TO COLOR INTERFERENCE OF URINE PIGMENT*      Imaging: Dg Chest Port 1 View  Result Date: 04/14/2018 CLINICAL DATA:  Fever EXAM: PORTABLE CHEST 1 VIEW COMPARISON:  01/25/2018 FINDINGS: There is hazy left lower lobe airspace disease concerning for pneumonia. There is no other focal parenchymal opacity. There is mild bilateral chronic interstitial thickening. There is no pleural effusion or pneumothorax. The heart and mediastinal contours are unremarkable. The osseous structures are unremarkable. IMPRESSION: 1. Hazy left lower lobe airspace disease concerning for pneumonia. Followup PA and lateral chest X-ray is recommended in 3-4 weeks following trial of antibiotic therapy to ensure resolution and exclude underlying malignancy. Electronically Signed   By: Kathreen Devoid   On: 04/14/2018 12:37      Assessment & Plan: Tiffany Turner is a 77 y.o. white female with dementia, coronary artery disease, hypertension, hyperlipidemia, who was admitted to [facility] on 04/14/2018 for Urinary retention [R33.9] Urinary tract infection with hematuria, site unspecified [N39.0, R31.9] Acute renal failure, unspecified acute renal failure type ([place]) [N17.9] Altered mental status, unspecified altered mental status type [R41.82]  1. Acute renal failure 2. Hypernatremia 3. Metabolic acidosis 4. Urinary tract infection 5. Hyperkalemia 6. Anemia with renal  failure  Plan Appreciate palliative care for goals of care Continue foley catheter Change IV fluids to sodium bicarbonate gtt.  May need free water if sodium remains elevated due to free water deficit.     LOS: 1 Caitriona Sundquist 10/10/201910:41 AM

## 2018-04-15 NOTE — Progress Notes (Signed)
[facility] - [place] [facility]   PATIENT NAME: Tiffany Turner    MR#:  [id number]  DATE OF BIRTH:  August 21, 1940  SUBJECTIVE:  CHIEF COMPLAINT:   Chief Complaint  Patient presents with  . Code Sepsis   Afebrile today.  On 2 L oxygen. Unable to obtain any history from patient due to dementia.  Son at bedside.  REVIEW OF SYSTEMS:    Review of Systems  Unable to perform ROS: Dementia    DRUG ALLERGIES:   Allergies  Allergen Reactions  . Penicillins Other (See Comments)    Has patient had a PCN reaction causing immediate rash, facial/tongue/throat swelling, SOB or lightheadedness with hypotension: Unknown Has patient had a PCN reaction causing severe rash involving mucus membranes or skin necrosis: Unknown Has patient had a PCN reaction that required hospitalization: Unknown Has patient had a PCN reaction occurring within the last 10 years: Unknown If all of the above answers are "NO", then may proceed with Cephalosporin use.  . Sulfa Antibiotics Other (See Comments)    VITALS:  Blood pressure (!) 142/87, pulse 76, temperature 98.2 F (36.8 C), temperature source Oral, resp. rate 16, weight 48 kg, SpO2 100 %.  PHYSICAL EXAMINATION:   Physical Exam  GENERAL:  77 y.o.-year-old patient lying in the bed , confused EYES: Pupils equal, round, reactive to light and accommodation. No scleral icterus. Extraocular muscles intact.  HEENT: Head atraumatic, normocephalic. Oropharynx and nasopharynx clear.  NECK:  Supple, no jugular venous distention. No thyroid enlargement, no tenderness.  LUNGS: Normal breath sounds bilaterally, no wheezing, rales, rhonchi. No use of accessory muscles of respiration.  CARDIOVASCULAR: S1, S2 normal. No murmurs, rubs, or gallops.  ABDOMEN: Soft, nontender, nondistended. Bowel sounds present. No organomegaly or mass.  EXTREMITIES: No cyanosis, clubbing or edema b/l.    NEUROLOGIC: Moves all 4 extremities symmetrically PSYCHIATRIC:  The patient is alert and awake SKIN: No obvious rash, lesion, or ulcer.   LABORATORY PANEL:   CBC Recent Labs  Lab 04/15/18 0407  WBC 8.1  HGB 8.4*  HCT 27.4*  PLT 289   ------------------------------------------------------------------------------------------------------------------ Chemistries  Recent Labs  Lab 04/14/18 1425 04/15/18 0407  NA 143 149*  K 5.6* 5.4*  CL 117* 126*  CO2 16* 19*  GLUCOSE 265* 115*  BUN 118* 86*  CREATININE 3.50* 1.68*  CALCIUM 8.1* 9.0  AST 50*  --   ALT 20  --   ALKPHOS 62  --   BILITOT 0.6  --    ------------------------------------------------------------------------------------------------------------------  Cardiac Enzymes Recent Labs  Lab 04/14/18 1425  TROPONINI 0.05*   ------------------------------------------------------------------------------------------------------------------  RADIOLOGY:  Dg Chest Port 1 View  Result Date: 04/14/2018 CLINICAL DATA:  Fever EXAM: PORTABLE CHEST 1 VIEW COMPARISON:  01/25/2018 FINDINGS: There is hazy left lower lobe airspace disease concerning for pneumonia. There is no other focal parenchymal opacity. There is mild bilateral chronic interstitial thickening. There is no pleural effusion or pneumothorax. The heart and mediastinal contours are unremarkable. The osseous structures are unremarkable. IMPRESSION: 1. Hazy left lower lobe airspace disease concerning for pneumonia. Followup PA and lateral chest X-ray is recommended in 3-4 weeks following trial of antibiotic therapy to ensure resolution and exclude underlying malignancy. Electronically Signed   By: Elige Ko   On: 04/14/2018 12:37     ASSESSMENT AND PLAN:   *Left lower lobe community-acquired pneumonia.  On IV Levaquin.  Associated acute hypoxic respiratory failure.  Wean oxygen as tolerated.  Nebulizers as needed  *UTI.  Waiting on  cultures.  On IV antibiotics.  *Acute metabolic encephalopathy over baseline dementia Improving  slowly.  * Essential hypertension.  Current blood pressure okay.  Continue to monitor. * History of CAD.  Aspirin  All the records are reviewed and case discussed with Care Management/Social Worker Management plans discussed with the patient, family and they are in agreement.  CODE STATUS: FULL CODE  DVT Prophylaxis: SCDs  TOTAL TIME TAKING CARE OF THIS PATIENT: 35 minutes.   POSSIBLE D/C IN 1-2 DAYS, DEPENDING ON CLINICAL CONDITION.  Molinda Bailiff Alaya Iverson M.D on 04/15/2018 at 4:31 PM  Between 7am to 6pm - Pager - [phone]  After 6pm go to [email or web address] - password [email or web address]  [facility]  Office  [phone]  CC: Primary care physician; Patient, No Pcp Per  Note: This dictation was prepared with Dragon dictation along with smaller phrase technology. Any transcriptional errors that result from this process are unintentional.

## 2018-04-15 NOTE — Consult Note (Signed)
Consultation Note Date: 04/15/2018   Patient Name: Tiffany Turner  DOB: [birth year]  MRN: [id number]  Age / Sex: 77 y.o., female  PCP: Patient, No Pcp Per Referring Physician: Hillary Bow, MD  Reason for Consultation: Establishing goals of care  HPI/Patient Profile: 77 y.o. female  with past medical history of dementia, hypertension, hyperlipidemia, and CAD admitted on 04/14/2018 with altered mental status. In ED, patient meeting sepsis criteria secondary to acute cystitis and pneumonia. Also with acute kidney injury with creatinine of 3.5. Started on IVF and IV antibiotics. Patient with mild dementia and living at memory care unit. Recently enrolled in hospice services one week ago. FULL code. Palliative medicine consultation for goals of care.   Clinical Assessment and Goals of Care:  I have reviewed medical records, discussed with care team, and met with patient and son Gerald Stabs) at bedside to discuss diagnosis, prognosis, GOC, EOL wishes, disposition and options. Patient is drowsy but will wake to voice and answer simple questions. Worked well with speech therapy and diet initiated. No s/s of pain or discomfort.   Introduced Palliative Medicine as specialized medical care for people living with serious illness. It focuses on providing relief from the symptoms and stress of a serious illness. The goal is to improve quality of life for both the patient and the family.  We discussed a brief life review of the patient. 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 She has been living at [facility] memory care for about two years, around the time she was officially diagnosed with dementia. Prior to hospitalization, patient wheelchair bound (for the last two weeks secondary knee pain and needing a knee replacement per son).  Baseline, her confusion has been mild. She participates in group activities at the facility and assists other residents. Her appetite has been great up until last weekend.   Gerald Stabs tells me she was enrolled in hospice services about one week ago because her ammonia was elevated? Gerald Stabs did not have a clear understanding of hospice philosophy and focus on comfort measures instead of aggressive, heroic interventions.   Discussed at length course of hospitalization including diagnoses and interventions. Educated on disease trajectory of dementia including progressive, irreversible nature of this disease. Explained high risk for recurrent hospitalization secondary to infection, dehydration, and/or poor oral intake due to underlying dementia. Gerald Stabs has a good understanding that dementia is chronic and progression but shares that she was "fine" prior to this infection and speaks of his frustrations that the infection was not found sooner.   I attempted to elicit values and goals of care important to the patient and Gerald Stabs. Gerald Stabs says "I really don't know" and "can I get back to you?"   I further discussed advanced directives, concepts specific to code status, and artifical feeding and hydration. The patient does NOT have a living will and has not spoken to [facility] of her wishes regarding heroic interventions. Introduced MOST form. Educated  on medical recommendation against heroic measures such as resuscitation, life support, or feeding tube with underlying frailty and dementia. Explained that these interventions would not change the irreversible dementia and if she would survive an event such as this, she would likely have poor quality of life. Again, Gerald Stabs is not ready to make decisions today. Encouraged Gerald Stabs to further review MOST form and Hard Choices booklet.   Gerald Stabs is "confident" that she will get better. He speaks of her showing cognitive improvement in the last 24 hours. "Perky" today and accepting all food  that was offered from speech therapist. Gerald Stabs is hopeful to get his mother back to her previous baseline.   I did educate further on hospice philosophy and answered all questions and concerns. PMT contact information given.    SUMMARY OF RECOMMENDATIONS    Continue FULL code/FULL scope treatment. Watchful waiting. Son "confident" that his mother will show improvement.   Discussed in detail hospital diagnoses, interventions, and disease trajectory of dementia. Introduced and discussed MOST form including recommendation against heroic interventions at EOL with underlying dementia and frailty. Gerald Stabs is overwhelmed and not ready to make decisions today regarding heroic interventions. Encouraged he review Hard Choices booklet.   Patient recently enrolled in hospice services at SNF. Son did not have a good understanding of hospice philosophy and role of comfort measures over aggressive measures. Would not re-start hospice services on discharge until son further decides on goals of care.   Code Status/Advance Care Planning:  Full code  Symptom Management:   Per attending  Palliative Prophylaxis:   Aspiration, Delirium Protocol, Oral Care and Turn Reposition  Additional Recommendations (Limitations, Scope, Preferences):  Full Scope Treatment  Psycho-social/Spiritual:   Desire for further Chaplaincy support:yes  Additional Recommendations: Caregiving  Support/Resources and Education on Hospice  Prognosis:   Unable to determine  Discharge Planning: To Be Determined      Primary Diagnoses: Present on Admission: . Acute cystitis   I have reviewed the medical record, interviewed the patient and family, and examined the patient. The following aspects are pertinent.  Past Medical History:  Diagnosis Date  . Coronary artery disease    stents placed  . Dementia ([place])   . Hip dislocation, right (HCC)    hx of dislocation  . Hyperlipemia   . Hypertension    Social History    Socioeconomic History  . Marital status: Single    Spouse name: Not on file  . Number of children: Not on file  . Years of education: Not on file  . Highest education level: Not on file  Occupational History  . Not on file  Social Needs  . Financial resource strain: Not on file  . Food insecurity:    Worry: Not on file    Inability: Not on file  . Transportation needs:    Medical: Not on file    Non-medical: Not on file  Tobacco Use  . Smoking status: Current Every Day Smoker    Packs/day: 0.50    Types: Cigarettes  . Smokeless tobacco: Never Used  Substance and Sexual Activity  . Alcohol use: No  . Drug use: No  . Sexual activity: Not on file  Lifestyle  . Physical activity:    Days per week: Not on file    Minutes per session: Not on file  . Stress: Not on file  Relationships  . Social connections:    Talks on phone: Not on file    Gets together: Not on file  Attends religious service: Not on file    Active member of club or organization: Not on file    Attends meetings of clubs or organizations: Not on file    Relationship status: Not on file  Other Topics Concern  . Not on file  Social History Narrative  . Not on file   Family History  Problem Relation Age of Onset  . CAD Father    Scheduled Meds: . artificial tears  1 application Right Eye BID  . aspirin  300 mg Rectal Daily  . fluticasone  2 spray Each Nare Daily  . heparin  5,000 Units Subcutaneous Q8H  . insulin aspart  0-5 Units Subcutaneous QHS  . insulin aspart  0-9 Units Subcutaneous TID WC  . mouth rinse  15 mL Mouth Rinse BID  . naphazoline-glycerin  2 drop Both Eyes QHS  . polyvinyl alcohol  1 drop Both Eyes Daily  . risperiDONE  1 mg Oral BID  . rivastigmine  1.5 mg Oral BID   Continuous Infusions: . [START ON 04/16/2018] levofloxacin (LEVAQUIN) IV    .  sodium bicarbonate  infusion 1000 mL     PRN Meds:.acetaminophen **OR** acetaminophen, morphine CONCENTRATE, ondansetron **OR**  ondansetron (ZOFRAN) IV Medications Prior to Admission:  Prior to Admission medications   Medication Sig Start Date End Date Taking? Authorizing Provider  acetaminophen (TYLENOL) 500 MG tablet Take 500 mg by mouth every 4 (four) hours as needed for fever or headache.   Yes [provider]  alum & mag hydroxide-simeth (MINTOX REGULAR STRENGTH) 223-361-22 MG/5ML suspension Take 30 mLs by mouth every 6 (six) hours as needed for indigestion or heartburn.   Yes [provider]  Artificial Tear Ointment ([place] PETROL-MINERAL OIL-LANOLIN) 0.1-0.1 % OINT Place 0.5 inches into the right eye 2 (two) times daily. 03/31/18  Yes Carrie Mew, MD  aspirin 81 MG tablet Take 81 mg by mouth daily.    Yes [provider]  atorvastatin (LIPITOR) 40 MG tablet Take 40 mg by mouth daily.   Yes [provider]  diclofenac sodium (VOLTAREN) 1 % GEL Apply 2 g topically 2 (two) times daily.   Yes [provider]  enalapril (VASOTEC) 20 MG tablet Take 20 mg by mouth daily.    Yes [provider]  fluticasone (FLONASE) 50 MCG/ACT nasal spray Place 2 sprays into both nostrils daily.    Yes [provider]  guaifenesin (ROBITUSSIN) 100 MG/5ML syrup Take 200 mg by mouth every 6 (six) hours as needed for cough.   Yes [provider]  ibuprofen (ADVIL,MOTRIN) 400 MG tablet Take 400 mg by mouth 3 (three) times daily.    Yes [provider]  loperamide (IMODIUM) 2 MG capsule Take 2 mg by mouth as needed for diarrhea or loose stools (max 8 doses daily).   Yes [provider]  LORazepam (ATIVAN) 0.5 MG tablet Take 0.5 mg by mouth 2 (two) times daily as needed for anxiety.   Yes [provider]  magnesium hydroxide (MILK OF MAGNESIA) 400 MG/5ML suspension Take 30 mLs by mouth daily as needed for mild constipation.   Yes [provider]  metoprolol succinate (TOPROL-XL) 50 MG 24 hr tablet Take 50 mg by mouth daily. Take with or  immediately following a meal.   Yes [provider]  Morphine Sulfate (MORPHINE CONCENTRATE) 10 mg / 0.5 ml concentrated solution Take 5 mg by mouth every hour as needed for severe pain, anxiety or shortness of breath.  Yes [provider]  neomycin-bacitracin-polymyxin (NEOSPORIN) OINT Apply 1 application topically as needed for wound care.   Yes [provider]  Polyethyl Glycol-Propyl Glycol (SYSTANE) 0.4-0.3 % SOLN Place 1 drop into both eyes daily.   Yes [provider]  risperiDONE (RISPERDAL) 1 MG tablet Take 1 mg by mouth 2 (two) times daily.   Yes [provider]  rivastigmine (EXELON) 1.5 MG capsule Take 1.5 mg by mouth 2 (two) times daily.   Yes [provider]  tetrahydrozoline 0.05 % ophthalmic solution Place 3 drops into both eyes at bedtime.   Yes [provider]  traMADol (ULTRAM) 50 MG tablet Take 50 mg by mouth 3 (three) times daily.   Yes [provider]  traZODone (DESYREL) 50 MG tablet Take 50 mg by mouth at bedtime.   Yes [provider]  Valproate Sodium (DEPAKENE) 250 MG/5ML SOLN solution Take 250 mg by mouth 2 (two) times daily.   Yes [provider]   Allergies  Allergen Reactions  . Penicillins Other (See Comments)    Has patient had a PCN reaction causing immediate rash, facial/tongue/throat swelling, SOB or lightheadedness with hypotension: Unknown Has patient had a PCN reaction causing severe rash involving mucus membranes or skin necrosis: Unknown Has patient had a PCN reaction that required hospitalization: Unknown Has patient had a PCN reaction occurring within the last 10 years: Unknown If all of the above answers are "NO", then may proceed with Cephalosporin use.  . Sulfa Antibiotics Other (See Comments)   Review of Systems  Unable to perform ROS: Dementia   Physical Exam  Constitutional: She is easily aroused. She appears ill.  HENT:  Head: Normocephalic and  atraumatic.  Pulmonary/Chest: No accessory muscle usage. No tachypnea. No respiratory distress.  Abdominal: There is no tenderness.  Neurological: She is easily aroused.  Wakes to voice. Drowsy. Pleasantly confused with baseline dementia. Will answer simple questions.   Skin: Skin is warm and dry. There is pallor.  Psychiatric: Her speech is delayed. Cognition and memory are impaired. She is inattentive.  Nursing note and vitals reviewed.   Vital Signs: BP (!) 142/87 (BP Location: Left Arm)   Pulse 76   Temp 98.2 F (36.8 C) (Oral)   Resp 16   Wt 48 kg   SpO2 100%   BMI 19.35 kg/m  Pain Scale: PAINAD     SpO2: SpO2: 100 % O2 Device:SpO2: 100 % O2 Flow Rate: .O2 Flow Rate (L/min): 2 L/min  IO: Intake/output summary:   Intake/Output Summary (Last 24 hours) at 04/15/2018 1101 Last data filed at 04/15/2018 0623 Gross per 24 hour  Intake 4138.32 ml  Output 3075 ml  Net 1063.32 ml    LBM:   Baseline Weight: Weight: 48 kg Most recent weight: Weight: 48 kg     Palliative Assessment/Data: PPS 30%   Flowsheet Rows     Most Recent Value  Intake Tab  Referral Department  Hospitalist  Unit at Time of Referral  Med/Surg Unit  Palliative Care Primary Diagnosis  Neurology [Sepsis, Dementia]  Palliative Care Type  New Palliative care  Reason for referral  Clarify Goals of Care  Date first seen by Palliative Care  04/15/18  Clinical Assessment  Palliative Performance Scale Score  30%  Psychosocial & Spiritual Assessment  Palliative Care Outcomes  Patient/Family meeting held?  Yes  Who was at the meeting?  patient and son  Palliative Care Outcomes  Clarified goals of care, Counseled regarding hospice, Provided psychosocial  or spiritual support, ACP counseling assistance, Provided end of life care assistance      Time In: 0940 Time Out: 1050 Time Total: 67mn Greater than 50%  of this time was spent counseling and coordinating care related to the above assessment and  plan.  Signed by:  MIhor Dow FNP-C Palliative Medicine Team  Phone: [phone]: [phone]  Please contact Palliative Medicine Team phone at [phone] questions and concerns.  For individual provider: See AShea Evans

## 2018-04-15 NOTE — Evaluation (Signed)
Clinical/Bedside Swallow Evaluation Patient Details  Name: Tiffany Turner MRN: [id number] Date of Birth: 1941-02-10  Today's Date: 04/15/2018 Time: SLP Start Time (ACUTE ONLY): 4098 SLP Stop Time (ACUTE ONLY): 1005 SLP Time Calculation (min) (ACUTE ONLY): 60 min  Past Medical History:  Past Medical History:  Diagnosis Date  . Coronary artery disease    stents placed  . Dementia (HCC)   . Hip dislocation, right (HCC)    hx of dislocation  . Hyperlipemia   . Hypertension    Past Surgical History:  Past Surgical History:  Procedure Laterality Date  . CARDIAC SURGERY     stents  . CORONARY ANGIOPLASTY WITH STENT PLACEMENT    . TOTAL HIP ARTHROPLASTY Right    HPI:  Pt is a 77 y.o. female with a known history of Dementia, HTN, CAD, hip dislocation more chair/bed bound per Son.  Patient unable to give any information at this time.  As per son, she is normally able to remember things, have a conversation and eat "when she is on her meds".  Patient was unable to give any history at this time.  Only saying a few words in the ED.  Patient is under hospice services through [facility].  Patient was admitted w/ acute kidney injury on chronic kidney disease; hyperkalemia, and UTI(recently treated for UTI per chart note). Per CXR, hazy left lower lobe airspace disease concerning for pneumonia.    Assessment / Plan / Recommendation Clinical Impression  Pt appears to present w/ adequate oropharyngeal phase swallowing function w/ slight-min decreased effort for mastication during bolus management at this time suspect d/t overall weakness secondary to illness/infection, baseline Dementia. Pt positioned more upright then assisted w/ feeding of po trials of thin liquids, purees, and broken down solids. No overt s/s of aspiration noted; no wet vocal quality or decline in respiratory status noted during/post trials. Oral phase was grossly [facility] but noted min slower, more efffortful mastication during bolus  management but given time, she was able to clear appropriately. No oral residue remained. No gross unilateral weakness noted in lingual/labial movements during bolus management; no anterior loss though pt's head was down, tilted to the side slightly. Unsure of the impact of her baseline Dementia/Cognitive decline on her eating/drinking skills vs weakness, or both. Recommend a more modified, Minced foods diet for easier oral phase initially, and thin liquids w/ strict aspiration precautions and monitoring during meals to assist her. Recommend pills in puree for safer swallowing at this time. ST services will f/u w/ toleration of diet and trials to upgrade the food consistency when appropriate. Son mentioned recent indigestion, belching noted - so, recommend Reflux precautions during/post meals.  SLP Visit Diagnosis: Dysphagia, oral phase (R13.11)(slight-min d/t overall weakness)    Aspiration Risk  (reduced following general aspiration precautions)    Diet Recommendation  Dysphagia level 2 (MINCED foods) w/ thin liquids; aspiration precautions; reflux precautions  Medication Administration: Whole meds with puree(for safer swallowing)    Other  Recommendations Recommended Consults: (Dietician f/u) Oral Care Recommendations: Oral care BID;Staff/trained caregiver to provide oral care Other Recommendations: (n/a)   Follow up Recommendations None      Frequency and Duration min 2x/week  1 week       Prognosis Prognosis for Safe Diet Advancement: Fair(-Good) Barriers to Reach Goals: Cognitive deficits;Time post onset;Severity of deficits      Swallow Study   General Date of Onset: 04/14/18 HPI: Pt is a 77 y.o. female with a known history of Dementia, HTN,  CAD, hip dislocation more chair/bed bound per Son.  Patient unable to give any information at this time.  As per son, she is normally able to remember things, have a conversation and eat "when she is on her meds".  Patient was unable to give  any history at this time.  Only saying a few words in the ED.  Patient is under hospice services through [facility].  Patient was admitted w/ acute kidney injury on chronic kidney disease; hyperkalemia, and UTI(recently treated for UTI per chart note). Per CXR, hazy left lower lobe airspace disease concerning for pneumonia.  Type of Study: Bedside Swallow Evaluation Previous Swallow Assessment: none reported Diet Prior to this Study: NPO(on a [facility] per Son) Temperature Spikes Noted: (wbc 8.1;  temp 102 at admission) Respiratory Status: Nasal cannula(2 liters) History of Recent Intubation: No Behavior/Cognition: Cooperative;Pleasant mood;Confused;Distractible;Requires cueing(baseline Dementia at [facility]) Oral Cavity Assessment: Dry Oral Care Completed by SLP: Recent completion by staff Oral Cavity - Dentition: Adequate natural dentition;Missing dentition(few) Vision: Functional for self-feeding(fair) Self-Feeding Abilities: Needs assist;Needs set up;Total assist(weakness; Cognitive decline) Patient Positioning: Upright in bed Baseline Vocal Quality: Normal;Low vocal intensity(adequate) Volitional Cough: Strong(fair) Volitional Swallow: Able to elicit    Oral/Motor/Sensory Function Overall Oral Motor/Sensory Function: Within functional limits(grossly)   Ice Chips Ice chips: Within functional limits Presentation: Spoon(fed; 3 trials)   Thin Liquid Thin Liquid: Within functional limits Presentation: Cup;Self Fed;Straw(fully supported; 5 trials each) Other Comments: pinched straw to reduce multiple sips, gulping    Nectar Thick Nectar Thick Liquid: Not tested   Honey Thick Honey Thick Liquid: Not tested   Puree Puree: Within functional limits Presentation: Spoon(fed; 5 trials)   Solid     Solid: Impaired(mech soft/broken down trials) Presentation: Spoon(fed; 5 trials) Oral Phase Impairments: Impaired mastication(min slower, more effort overall) Oral Phase  Functional Implications: (min slower, more effort) Pharyngeal Phase Impairments: (none) Other Comments: suspect d/t overall weakness secondary to illness at this time       Jerilynn Som, MS, CCC-SLP Watson,Katherine 04/15/2018,11:26 AM

## 2018-04-15 NOTE — Clinical Social Work Note (Signed)
CSW spoke with Victorino Dike at [facility] about patient. Victorino Dike states that patient is under hospice services through [facility]. CSW will continue to follow for discharge planning.   Ruthe Mannan MSW, [address] [phone]

## 2018-04-15 NOTE — Plan of Care (Signed)
  Problem: Education: Goal: Knowledge of General Education information will improve Description Including pain rating scale, medication(s)/side effects and non-pharmacologic comfort measures Outcome: Not Progressing   Problem: Health Behavior/Discharge Planning: Goal: Ability to manage health-related needs will improve Outcome: Not Progressing   Problem: Clinical Measurements: Goal: Ability to maintain clinical measurements within normal limits will improve Outcome: Progressing Goal: Will remain free from infection Outcome: Progressing Goal: Diagnostic test results will improve Outcome: Progressing Goal: Respiratory complications will improve Outcome: Progressing Goal: Cardiovascular complication will be avoided Outcome: Progressing   Problem: Activity: Goal: Risk for activity intolerance will decrease Outcome: Not Progressing   Problem: Nutrition: Goal: Adequate nutrition will be maintained Outcome: Not Progressing   Problem: Coping: Goal: Level of anxiety will decrease Outcome: Not Progressing   Problem: Elimination: Goal: Will not experience complications related to bowel motility Outcome: Not Progressing Goal: Will not experience complications related to urinary retention Outcome: Not Progressing   Problem: Pain Managment: Goal: General experience of comfort will improve Outcome: Progressing   Problem: Safety: Goal: Ability to remain free from injury will improve Outcome: Progressing   Problem: Skin Integrity: Goal: Risk for impaired skin integrity will decrease Outcome: Progressing

## 2018-04-16 LAB — URINE CULTURE: Culture: >=100000 — AB

## 2018-04-16 LAB — GLUCOSE, CAPILLARY
GLUCOSE-CAPILLARY: 117 mg/dL — AB (ref 70–99)
GLUCOSE-CAPILLARY: 130 mg/dL — AB (ref 70–99)
Glucose-Capillary: 119 mg/dL — ABNORMAL HIGH (ref 70–99)
Glucose-Capillary: 154 mg/dL — ABNORMAL HIGH (ref 70–99)

## 2018-04-16 LAB — RENAL FUNCTION PANEL
ALBUMIN: 2.5 g/dL — AB (ref 3.5–5.0)
Anion gap: 8 (ref 5–15)
BUN: 52 mg/dL — AB (ref 8–23)
CALCIUM: 8.8 mg/dL — AB (ref 8.9–10.3)
CO2: 26 mmol/L (ref 22–32)
CREATININE: 1.12 mg/dL — AB (ref 0.44–1.00)
Chloride: 120 mmol/L — ABNORMAL HIGH (ref 98–111)
GFR calc non Af Amer: 46 mL/min — ABNORMAL LOW (ref >60)
GFR, EST AFRICAN AMERICAN: 53 mL/min — AB (ref >60)
Glucose, Bld: 133 mg/dL — ABNORMAL HIGH (ref 70–99)
PHOSPHORUS: 1.6 mg/dL — AB (ref 2.5–4.6)
Potassium: 4.2 mmol/L (ref 3.5–5.1)
SODIUM: 154 mmol/L — AB (ref 135–145)

## 2018-04-16 MED ORDER — LACTATED RINGERS IV SOLN: INTRAVENOUS | Status: DC

## 2018-04-16 MED ORDER — LEVOFLOXACIN IN D5W 750 MG/150ML IV SOLN
750.0000 mg | INTRAVENOUS | Status: DC
  Administered 2018-04-16: 18:00:00 750 mg via INTRAVENOUS
  Filled 2018-04-16: qty 150

## 2018-04-16 MED ORDER — DEXTROSE 5 % IV SOLN
INTRAVENOUS | Status: DC
  Administered 2018-04-16: 12:00:00 via INTRAVENOUS

## 2018-04-16 MED ORDER — SODIUM CHLORIDE 0.45 % IV SOLN
INTRAVENOUS | Status: DC
  Administered 2018-04-16: 07:00:00 via INTRAVENOUS

## 2018-04-16 MED ORDER — ADULT MULTIVITAMIN W/MINERALS CH
1.0000 | ORAL_TABLET | Freq: Every day | ORAL | Status: DC
  Administered 2018-04-16 – 2018-04-18 (×3): 1 via ORAL
  Filled 2018-04-16 (×3): qty 1

## 2018-04-16 MED ORDER — POTASSIUM PHOSPHATES 15 MMOLE/5ML IV SOLN
10.0000 mmol | Freq: Once | INTRAVENOUS | Status: AC
  Administered 2018-04-16: 18:00:00 10 mmol via INTRAVENOUS
  Filled 2018-04-16: qty 3.33

## 2018-04-16 NOTE — Progress Notes (Signed)
Pharmacy Electrolyte Monitoring Consult:  Pharmacy consulted to assist in monitoring and replacing electrolytes in this 77 y.o. 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 Hypophosphatemia  Labs:  Sodium (mmol/L)  Date Value  04/16/2018 154 (H)  11/02/2014 144   Potassium (mmol/L)  Date Value  04/16/2018 4.2  11/02/2014 3.7   Phosphorus (mg/dL)  Date Value  16/04/9603 1.6 (L)   Calcium (mg/dL)  Date Value  54/03/8118 8.8 (L)   Calcium, Total (mg/dL)  Date Value  14/78/2956 9.2   Albumin (g/dL)  Date Value  21/30/8657 2.5 (L)  07/29/2014 4.0    Assessment/Plan: K 4.2  Phos 1.6  Scr 1.12  (Wt= 48 kg)  Na 154 Will order Potassium Phosphate 10 mmol IV x 1 (not tolerating orals well per MD) F/u electrolytes with am labs.   Sylvia Kondracki A 04/16/2018 2:41 PM

## 2018-04-16 NOTE — Progress Notes (Signed)
SLP Cancellation Note  Patient Details Name: Tiffany Turner MRN: [id number] DOB: 12/29/1940   Cancelled treatment:       Reason Eval/Treat Not Completed: Fatigue/lethargy limiting ability to participate(pt sleeping post meal; Son in room). Chart reviewed. Met w/ pt's Son in room to discuss her toleration of the Minced foods/diet w/ thin liquids. NSG assisted pt this morning and reported good toleration of the diet consistency - no overt s/s of aspiration noted during drinking of thin liquids and eating of foods (pt ate mostly the purees on the tray). Son noted similar at the lunch mea; pt eating more puree foods. Discussed this briefly w/ him and explained it's an easier consistency for pt during illness(?). Education given briefly; recommended continuing w/ general aspiration precautions including NO large straws especially drinking in bed (and pt has Cognitive decline at baseline).  ST services will f/u w/ toleration of diet but recommend this diet consistency for discharge in order for pt to meet her nutritional needs w/ the least amount of oral effort. Son agreed. NSG updated.    Orinda Kenner, [place], CCC-SLP [name] 04/16/2018, 2:48 PM

## 2018-04-16 NOTE — Care Management Important Message (Signed)
Needs initial Medicare IM signed.  Attempted to reach [name], patient's legal guardian, at [phone] to review, however unable to reach or leave message.  Will attempt at later time.

## 2018-04-16 NOTE — Progress Notes (Signed)
[facility]  ROUNDING NOTE   Subjective:   More awake, but confused.   Creatinine 1.12 (1.68)_  Na 154 (149) (143)  UOP  Objective:  Vital signs in last 24 hours:  Temp:  [98.2 F (36.8 C)-98.8 F (37.1 C)] 98.2 F (36.8 C) (10/11 0415) Pulse Rate:  [82-87] 82 (10/11 0415) Resp:  [17] 17 (10/11 0415) BP: (111-123)/(51-96) 111/96 (10/11 0415) SpO2:  [98 %-99 %] 98 % (10/11 0415)  Weight change:  Filed Weights   04/14/18 1115  Weight: 48 kg    Intake/Output: I/O last 3 completed shifts: In: 1895.2 [I.V.:1895.2] Out: 3025 [Urine:3025]   Intake/Output this shift:  Total I/O In: 360 [P.O.:360] Out: -   Physical Exam: General: NAD, laying in bed  Head: Normocephalic, atraumatic. Moist oral mucosal membranes  Eyes: Anicteric, PERRL  Neck: Supple, trachea midline  Lungs:  Clear to auscultation  Heart: Regular rate and rhythm  Abdomen:  Soft, nontender,   Extremities:  no peripheral edema.  Neurologic: Alert to self  Skin: No lesions  GU: Foley catheter    Basic Metabolic Panel: Recent Labs  Lab 04/14/18 1425 04/15/18 0407 04/16/18 0511  NA 143 149* 154*  K 5.6* 5.4* 4.2  CL 117* 126* 120*  CO2 16* 19* 26  GLUCOSE 265* 115* 133*  BUN 118* 86* 52*  CREATININE 3.50* 1.68* 1.12*  CALCIUM 8.1* 9.0 8.8*  PHOS  --   --  1.6*    Liver Function Tests: Recent Labs  Lab 04/14/18 1425 04/16/18 0511  AST 50*  --   ALT 20  --   ALKPHOS 62  --   BILITOT 0.6  --   PROT 5.9*  --   ALBUMIN 2.3* 2.5*   No results for input(s): LIPASE, AMYLASE in the last 168 hours. Recent Labs  Lab 04/14/18 1425  AMMONIA 27    CBC: Recent Labs  Lab 04/14/18 1058 04/15/18 0407  WBC 9.9 8.1  NEUTROABS 7.6  --   HGB 9.7* 8.4*  HCT 30.1* 27.4*  MCV 108.3* 111.4*  PLT 356 289    Cardiac Enzymes: Recent Labs  Lab 04/14/18 1425  TROPONINI 0.05*    BNP: Invalid input(s): POCBNP  CBG: Recent Labs  Lab 04/15/18 0741 04/15/18 1129  04/15/18 1718 04/15/18 2107 04/16/18 0800  GLUCAP 121* 125* 157* 124* 117*    Microbiology: Results for orders placed or performed during the hospital encounter of 04/14/18  Blood Culture (routine x 2)     Status: None (Preliminary result)   Collection Time: 04/14/18 10:58 AM  Result Value Ref Range Status   Specimen Description BLOOD RIGHT HAND  Final   Special Requests   Final    BOTTLES DRAWN AEROBIC AND ANAEROBIC Blood Culture adequate volume   Culture   Final    NO GROWTH < 24 HOURS Performed at [facility], [address] [place], [place] [address]    Report Status PENDING  Incomplete  Blood Culture (routine x 2)     Status: None (Preliminary result)   Collection Time: 04/14/18 11:21 AM  Result Value Ref Range Status   Specimen Description BLOOD LEFT ARM  Final   Special Requests   Final    BOTTLES DRAWN AEROBIC AND ANAEROBIC Blood Culture results may not be optimal due to an excessive volume of blood received in culture bottles   Culture   Final    NO GROWTH < 24 HOURS Performed at [facility], [address]  [place], [place] [address]    Report Status PENDING  Incomplete  Urine culture     Status: Abnormal (Preliminary result)   Collection Time: 04/14/18 11:51 AM  Result Value Ref Range Status   Specimen Description   Final    URINE, RANDOM Performed at [facility], [address], [place], [place] [address]    Special Requests   Final    NONE Performed at [facility], [address] [place], [place] [address]    Culture >=100,000 COLONIES/mL KLEBSIELLA PNEUMONIAE (A)  Final   Report Status PENDING  Incomplete  MRSA PCR Screening     Status: None   Collection Time: 04/15/18  3:39 AM  Result Value Ref Range Status   MRSA by PCR NEGATIVE NEGATIVE Final    Comment:        The GeneXpert MRSA Assay (FDA approved for NASAL specimens only), is one component of a comprehensive MRSA colonization surveillance program.  It is not intended to diagnose MRSA infection nor to guide or monitor treatment for MRSA infections. Performed at [facility], [address] [place], [place] [address]     Coagulation Studies: No results for input(s): LABPROT, INR in the last 72 hours.  Urinalysis: Recent Labs    04/14/18 1151  COLORURINE YELLOW  LABSPEC 1.022  PHURINE TEST NOT REPORTED DUE TO COLOR INTERFERENCE OF URINE PIGMENT  GLUCOSEU TEST NOT REPORTED DUE TO COLOR INTERFERENCE OF URINE PIGMENT*  HGBUR TEST NOT REPORTED DUE TO COLOR INTERFERENCE OF URINE PIGMENT*  BILIRUBINUR TEST NOT REPORTED DUE TO COLOR INTERFERENCE OF URINE PIGMENT*  KETONESUR TEST NOT REPORTED DUE TO COLOR INTERFERENCE OF URINE PIGMENT*  PROTEINUR TEST NOT REPORTED DUE TO COLOR INTERFERENCE OF URINE PIGMENT*  NITRITE TEST NOT REPORTED DUE TO COLOR INTERFERENCE OF URINE PIGMENT*  LEUKOCYTESUR TEST NOT REPORTED DUE TO COLOR INTERFERENCE OF URINE PIGMENT*      Imaging: No results found.   Medications:   . levofloxacin (LEVAQUIN) IV     . artificial tears  1 application Right Eye BID  . aspirin  300 mg Rectal Daily  . divalproex  250 mg Oral Q12H  . fluticasone  2 spray Each Nare Daily  . heparin  5,000 Units Subcutaneous Q8H  . insulin aspart  0-5 Units Subcutaneous QHS  . insulin aspart  0-9 Units Subcutaneous TID WC  . mouth rinse  15 mL Mouth Rinse BID  . naphazoline-glycerin  2 drop Both Eyes QHS  . polyvinyl alcohol  1 drop Both Eyes Daily  . risperiDONE  1 mg Oral BID  . rivastigmine  1.5 mg Oral BID   acetaminophen **OR** acetaminophen, morphine CONCENTRATE, ondansetron **OR** ondansetron (ZOFRAN) IV  Assessment/ Plan:  Ms. Tiffany Turner is a 77 y.o. white female with dementia, coronary artery disease, hypertension, hyperlipidemia, who was admitted to [facility] on 04/14/2018 for urinary tract infection  1. Acute renal failure on chronic kidney disease stage III 2. Hypernatremia: free water deficit of  2.2 liters 3. Metabolic acidosis 4. Urinary tract infection 5. Hyperkalemia 6. Anemia with renal failure  Plan Appreciate palliative care for goals of care Remove foley catheter - D5W infusion for free water deficit.     LOS: 2 Kaisa Wofford 10/11/201911:11 AM

## 2018-04-16 NOTE — Progress Notes (Signed)
[facility] [facility] [place] [facility]   PATIENT NAME: Tiffany Turner    MR#:  [id number]  DATE OF BIRTH:  03-30-1941  SUBJECTIVE:  CHIEF COMPLAINT:   Chief Complaint  Patient presents with  . Code Sepsis   Confused.  Poor appetite.  Afebrile.  REVIEW OF SYSTEMS:   Review of Systems  Unable to perform ROS: Dementia   DRUG ALLERGIES:   Allergies  Allergen Reactions  . Penicillins Other (See Comments)    Has patient had a PCN reaction causing immediate rash, facial/tongue/throat swelling, SOB or lightheadedness with hypotension: Unknown Has patient had a PCN reaction causing severe rash involving mucus membranes or skin necrosis: Unknown Has patient had a PCN reaction that required hospitalization: Unknown Has patient had a PCN reaction occurring within the last 10 years: Unknown If all of the above answers are "NO", then may proceed with Cephalosporin use.  . Sulfa Antibiotics Other (See Comments)    VITALS:  Blood pressure (!) 111/96, pulse 82, temperature 98.2 F (36.8 C), temperature source Oral, resp. rate 17, weight 48 kg, SpO2 98 %.  PHYSICAL EXAMINATION:   Physical Exam  GENERAL:  77 y.o.-year-old patient lying in the bed , confused EYES: Pupils equal, round, reactive to light and accommodation. No scleral icterus. Extraocular muscles intact.  HEENT: Head atraumatic, normocephalic. Oropharynx and nasopharynx clear.  NECK:  Supple, no jugular venous distention. No thyroid enlargement, no tenderness.  LUNGS: Normal breath sounds bilaterally, no wheezing, rales, rhonchi. No use of accessory muscles of respiration.  CARDIOVASCULAR: S1, S2 normal. No murmurs, rubs, or gallops.  ABDOMEN: Soft, nontender, nondistended. Bowel sounds present. No organomegaly or mass.  EXTREMITIES: No cyanosis, clubbing or edema b/l.    NEUROLOGIC: Moves all 4 extremities symmetrically PSYCHIATRIC: The patient is alert and awake SKIN: No obvious rash, lesion, or ulcer.    LABORATORY PANEL:   CBC Recent Labs  Lab 04/15/18 0407  WBC 8.1  HGB 8.4*  HCT 27.4*  PLT 289   ------------------------------------------------------------------------------------------------------------------ Chemistries  Recent Labs  Lab 04/14/18 1425  04/16/18 0511  NA 143   < > 154*  K 5.6*   < > 4.2  CL 117*   < > 120*  CO2 16*   < > 26  GLUCOSE 265*   < > 133*  BUN 118*   < > 52*  CREATININE 3.50*   < > 1.12*  CALCIUM 8.1*   < > 8.8*  AST 50*  --   --   ALT 20  --   --   ALKPHOS 62  --   --   BILITOT 0.6  --   --    < > = values in this interval not displayed.   ------------------------------------------------------------------------------------------------------------------  Cardiac Enzymes Recent Labs  Lab 04/14/18 1425  TROPONINI 0.05*  ------------------------------------------------------------------------------------------------------------------ RADIOLOGY:  No results found.  ASSESSMENT AND PLAN:   * Left lower lobe community-acquired pneumonia with acute hypoxic respiratory failure Continue IV Levaquin. Cultures remain negative. Wean oxygen as tolerated  *Klebsiella UTI On Levaquin  *Hypernatremia.  Change IV fluids to D5.  Discussed with Dr. Wynelle Link of nephrology.  * Acute metabolic encephalopathy over baseline dementia Improving slowly.  * Essential hypertension.  Current blood pressure okay.  Continue to monitor.  * History of CAD.  Aspirin  All the records are reviewed and case discussed with Care Management/Social Worker Management plans discussed with the patient, family and they are in agreement.  CODE STATUS: FULL CODE  DVT Prophylaxis: SCDs  TOTAL TIME TAKING CARE OF THIS PATIENT: 35 minutes.   POSSIBLE D/C IN 1-2 DAYS, DEPENDING ON CLINICAL CONDITION.  Molinda Bailiff Nance Mccombs M.D on 04/16/2018 at 12:25 PM  Between 7am to 6pm - Pager - [phone]  After 6pm go to [email or web address] - password [email or web address]  [facility]  Office  [phone]  CC: Primary care physician; Patient, No Pcp Per  Note: This dictation was prepared with Dragon dictation along with smaller phrase technology. Any transcriptional errors that result from this process are unintentional.

## 2018-04-16 NOTE — Progress Notes (Addendum)
Initial Nutrition Assessment  DOCUMENTATION CODES:   Not applicable  INTERVENTION:   -MVI with minerals daily -Hormel shakes TID with meals, each supplement provides 520 kcals and 22 grams protein -Magic Cup TID with meals, each supplement provides 290 kcals and 9 grams protein  NUTRITION DIAGNOSIS:   Inadequate oral intake related to lethargy/confusion as evidenced by meal completion < 25%.  GOAL:   Patient will meet greater than or equal to 90% of their needs  MONITOR:   PO intake, Supplement acceptance, Diet advancement, Labs, Weight trends, Skin, I & O's  REASON FOR ASSESSMENT:   Low Braden    ASSESSMENT:   Tiffany Turner  is a 77 y.o. female with a known history of Dementia.  Patient unable to give any information at this time.  As per son, she is normally able to remember things and have a conversation and eat.  Right now the patient is unable to give any history at this time.  Only able to say a few words.  Hospitalist services were contacted for admission for sepsis.  Pt admitted with sepsis. PTA pt was a resident of [address]   10/10- s/p BSE- advanced to dysphagia 2 diet with thin liquids  Palliative care evaluated pt on 04/15/18; pt son is hopeful that pt will improve and is requesting full scope treatment at this time. Of note, pt was recently enrolled in hospice services at SNF.   Pt not responsive to voice or touch. Noted no completion per doc floweets. No family present to provide further history.   Reviewed wt hx, which reveals wt stability. However, noted generalized edema, which may be masking true weight loss as well as fat and muscle depletions. Suspect poor oral intake PTA given enrollment in hospice services.   Pt at high risk for malnutrition, however, unable to identify at this time.   Labs reviewed: Na: 154, Phos: 1.6,  CBGS: 117-157 (inpatient orders for glycemic control are 0-5 units insulin aspart q HS and0-9 units insulin aspart TID  with meals)  NUTRITION - FOCUSED PHYSICAL EXAM:    Most Recent Value  Orbital Region  Moderate depletion  Upper Arm Region  Mild depletion  Thoracic and Lumbar Region  No depletion  Buccal Region  No depletion  Temple Region  No depletion  Clavicle Bone Region  No depletion  Clavicle and Acromion Bone Region  No depletion  Scapular Bone Region  No depletion  Dorsal Hand  No depletion  Patellar Region  No depletion  Anterior Thigh Region  No depletion  Posterior Calf Region  No depletion  Edema (RD Assessment)  Moderate  Hair  Reviewed  Eyes  Reviewed  Mouth  Reviewed  Skin  Reviewed  Nails  Reviewed       Diet Order:   Diet Order            DIET DYS 2 Room service appropriate? Yes with Assist; Fluid consistency: Thin  Diet effective now              EDUCATION NEEDS:   Not appropriate for education at this time  Skin:  Skin Assessment: Reviewed RN Assessment  Last BM:  04/16/18  Height:   Ht Readings from Last 1 Encounters:  10/10/17 5\' 2"  (1.575 m)    Weight:   Wt Readings from Last 1 Encounters:  04/14/18 48 kg    Ideal Body Weight:  50 kg  BMI:  Body mass index is 19.35 kg/m.  Estimated Nutritional Needs:  Kcal:  1200-1400  Protein:  55-70 grams  Fluid:  1.2-1.4 L    Kyah Buesing A. Mayford Knife, RD, LDN, CDE Pager: [phone] After hours Pager: [phone]

## 2018-04-16 NOTE — Progress Notes (Signed)
Pharmacy Antibiotic Note  Tiffany Turner is a 77 y.o. female admitted on 04/14/2018 with sepsis Pharmacy has been consulted for Levaquin dosing. PMH includes CAD, hyperlipidemia, HTN, dementia. She is noted to have a recent UTI. On this visit UA shows likely UTI and imaging is concerning for possible left lower lobe pneumonia. Additionally, she is in acute renal failure.  Plan: Levaquin 750mg  IV once then 500mg  IV every every 48 hours  10/11: Scr has improved to 1.12. Patient with contractures. Height listed in 2017 was 5 ft 1 in. Est Crcl 31 ml/min. Will adjust Levaquin to 750 mg IV Q48h. Day 3 of Levaquin tx.   Weight: 105 lb 13.1 oz (48 kg)  Temp (24hrs), Avg:98.5 F (36.9 C), Min:98.2 F (36.8 C), Max:98.8 F (37.1 C)  Recent Labs  Lab 04/14/18 1058 04/14/18 1425 04/15/18 0407 04/16/18 0511  WBC 9.9  --  8.1  --   CREATININE  --  3.50* 1.68* 1.12*  LATICACIDVEN 1.0 2.4*  --   --     CrCl cannot be calculated (Unknown ideal weight.).    Allergies  Allergen Reactions  . Penicillins Other (See Comments)    Has patient had a PCN reaction causing immediate rash, facial/tongue/throat swelling, SOB or lightheadedness with hypotension: Unknown Has patient had a PCN reaction causing severe rash involving mucus membranes or skin necrosis: Unknown Has patient had a PCN reaction that required hospitalization: Unknown Has patient had a PCN reaction occurring within the last 10 years: Unknown If all of the above answers are "NO", then may proceed with Cephalosporin use.  . Sulfa Antibiotics Other (See Comments)    Antimicrobials this admission: Vancomycin  10/9 x1 azttreonam 10/9 x1 Flagyl 10/9 x1 Levaquin 10/9 >>  Microbiology results: 10/9 BCx: pending 10/9 UCx: pending   Thank you for allowing pharmacy to be a part of this patient's care.  Tiffany Turner A, PharmD 04/16/2018 2:26 PM

## 2018-04-16 NOTE — Care Management Important Message (Signed)
Initial Medicare IM signed by Elly Modena, legal guardian.  Copy left in room.

## 2018-04-17 LAB — RENAL FUNCTION PANEL
Albumin: 2.1 g/dL — ABNORMAL LOW (ref 3.5–5.0)
Anion gap: 7 (ref 5–15)
BUN: 30 mg/dL — AB (ref 8–23)
CHLORIDE: 110 mmol/L (ref 98–111)
CO2: 25 mmol/L (ref 22–32)
CREATININE: 0.92 mg/dL (ref 0.44–1.00)
Calcium: 8 mg/dL — ABNORMAL LOW (ref 8.9–10.3)
GFR calc Af Amer: >60 mL/min (ref >60)
GFR, EST NON AFRICAN AMERICAN: 58 mL/min — AB (ref >60)
Glucose, Bld: 114 mg/dL — ABNORMAL HIGH (ref 70–99)
Phosphorus: 2.3 mg/dL — ABNORMAL LOW (ref 2.5–4.6)
Potassium: 4 mmol/L (ref 3.5–5.1)
SODIUM: 142 mmol/L (ref 135–145)

## 2018-04-17 LAB — GLUCOSE, CAPILLARY
GLUCOSE-CAPILLARY: 111 mg/dL — AB (ref 70–99)
GLUCOSE-CAPILLARY: 107 mg/dL — AB (ref 70–99)
Glucose-Capillary: 106 mg/dL — ABNORMAL HIGH (ref 70–99)
Glucose-Capillary: 99 mg/dL (ref 70–99)

## 2018-04-17 MED ORDER — ASPIRIN EC 81 MG PO TBEC
81.0000 mg | DELAYED_RELEASE_TABLET | Freq: Every day | ORAL | Status: DC
  Administered 2018-04-18: 09:00:00 81 mg via ORAL
  Filled 2018-04-17: qty 1

## 2018-04-17 MED ORDER — K PHOS MONO-SOD PHOS DI & MONO 155-852-130 MG PO TABS
500.0000 mg | ORAL_TABLET | Freq: Once | ORAL | Status: AC
  Administered 2018-04-17: 500 mg via ORAL
  Filled 2018-04-17: qty 2

## 2018-04-17 NOTE — Progress Notes (Signed)
Pharmacy Electrolyte Monitoring Consult:  Pharmacy consulted to assist in monitoring and replacing electrolytes in this 77 y.o. 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 Hypophosphatemia  Labs:  Sodium (mmol/L)  Date Value  04/17/2018 142  11/02/2014 144   Potassium (mmol/L)  Date Value  04/17/2018 4.0  11/02/2014 3.7   Phosphorus (mg/dL)  Date Value  21/30/8657 2.3 (L)   Calcium (mg/dL)  Date Value  84/69/6295 8.0 (L)   Calcium, Total (mg/dL)  Date Value  28/41/3244 9.2   Albumin (g/dL)  Date Value  07/09/7251 2.1 (L)  07/29/2014 4.0    Assessment/Plan: K 4.0  Phos 2.3  Scr 0.92  (Wt= 48 kg)   Will order Potassium Phosphate Neutral tab 2 tab q4h PO x1. Per RN pt taking meds in applesauce ok. F/u electrolytes with am labs.   Marty Heck 04/17/2018 8:27 AM

## 2018-04-17 NOTE — Progress Notes (Signed)
[facility] [place] at [facility]   PATIENT NAME: Tiffany Turner    MR#:  [id number]  DATE OF BIRTH:  1940-07-20  SUBJECTIVE:  CHIEF COMPLAINT:   Chief Complaint  Patient presents with  . Code Sepsis   Pleasantly Confused.  Poor appetite.  Afebrile. Weak No new issues per RN  REVIEW OF SYSTEMS:   Review of Systems  Unable to perform ROS: Dementia   DRUG ALLERGIES:   Allergies  Allergen Reactions  . Penicillins Other (See Comments)    Has patient had a PCN reaction causing immediate rash, facial/tongue/throat swelling, SOB or lightheadedness with hypotension: Unknown Has patient had a PCN reaction causing severe rash involving mucus membranes or skin necrosis: Unknown Has patient had a PCN reaction that required hospitalization: Unknown Has patient had a PCN reaction occurring within the last 10 years: Unknown If all of the above answers are "NO", then may proceed with Cephalosporin use.  . Sulfa Antibiotics Other (See Comments)    VITALS:  Blood pressure (!) 133/58, pulse 98, temperature 98.1 F (36.7 C), temperature source Oral, resp. rate 18, height 5\' 1"  (1.549 m), weight 48 kg, SpO2 98 %.  PHYSICAL EXAMINATION:   Physical Exam  GENERAL:  77 y.o.-year-old patient lying in the bed , confused EYES: Pupils equal, round, reactive to light and accommodation. No scleral icterus. Extraocular muscles intact.  HEENT: Head atraumatic, normocephalic. Oropharynx and nasopharynx clear.  NECK:  Supple, no jugular venous distention. No thyroid enlargement, no tenderness.  LUNGS: Normal breath sounds bilaterally, no wheezing, rales, rhonchi. No use of accessory muscles of respiration.  CARDIOVASCULAR: S1, S2 normal. No murmurs, rubs, or gallops.  ABDOMEN: Soft, nontender, nondistended. Bowel sounds present. No organomegaly or mass.  EXTREMITIES: No cyanosis, clubbing or edema b/l.    NEUROLOGIC: Moves all 4 extremities symmetrically PSYCHIATRIC: The patient  is alert and awake SKIN: No obvious rash, lesion, or ulcer.   LABORATORY PANEL:   CBC Recent Labs  Lab 04/15/18 0407  WBC 8.1  HGB 8.4*  HCT 27.4*  PLT 289   ------------------------------------------------------------------------------------------------------------------ Chemistries  Recent Labs  Lab 04/14/18 1425  04/17/18 0357  NA 143   < > 142  K 5.6*   < > 4.0  CL 117*   < > 110  CO2 16*   < > 25  GLUCOSE 265*   < > 114*  BUN 118*   < > 30*  CREATININE 3.50*   < > 0.92  CALCIUM 8.1*   < > 8.0*  AST 50*  --   --   ALT 20  --   --   ALKPHOS 62  --   --   BILITOT 0.6  --   --    < > = values in this interval not displayed.   ------------------------------------------------------------------------------------------------------------------  Cardiac Enzymes Recent Labs  Lab 04/14/18 1425  TROPONINI 0.05*  ------------------------------------------------------------------------------------------------------------------ RADIOLOGY:  No results found.  ASSESSMENT AND PLAN:   * Left lower lobe community-acquired pneumonia with acute hypoxic respiratory failure Continue IV Levaquin. Cultures remain negative. Wean oxygen as tolerated  *Klebsiella UTI On Levaquin  *Hypernatremia.  Change IV fluids to D5.  Discussed with Dr. Wynelle Link of nephrology. Na now corrected  * Acute metabolic encephalopathy over baseline dementia Improving slowly.  * Essential hypertension.  Current blood pressure okay.  Continue to monitor.  * History of CAD.  Aspirin  Tried calling son [name] on his phone [phone]. Could not leave message on VM  If remains at  baseline d/ec to ALF tomorrow. CSW notified  All the records are reviewed and case discussed with Care Management/Social Worker Management plans discussed with the patient, family and they are in agreement.  CODE STATUS: FULL CODE  DVT Prophylaxis: SCDs  TOTAL TIME TAKING CARE OF THIS PATIENT: 25 minutes.    POSSIBLE D/C IN 1-2 DAYS, DEPENDING ON CLINICAL CONDITION.  Enedina Finner M.D on 04/17/2018 at 8:39 PM  Between 7am to 6pm - Pager - [phone]  After 6pm go to [email or web address] - password [email or web address]  [facility] [place] [facility]  Office  [phone]  CC: Primary care physician; Patient, No Pcp Per  Note: This dictation was prepared with Dragon dictation along with smaller phrase technology. Any transcriptional errors that result from this process are unintentional.

## 2018-04-18 LAB — CBC
HCT: 24 % — ABNORMAL LOW (ref 36.0–46.0)
Hemoglobin: 7.8 g/dL — ABNORMAL LOW (ref 12.0–15.0)
MCH: 34.2 pg — AB (ref 26.0–34.0)
MCHC: 32.5 g/dL (ref 30.0–36.0)
MCV: 105.3 fL — AB (ref 80.0–100.0)
Platelets: 287 10*3/uL (ref 150–400)
RBC: 2.28 MIL/uL — ABNORMAL LOW (ref 3.87–5.11)
RDW: 12.5 % (ref 11.5–15.5)
WBC: 8 10*3/uL (ref 4.0–10.5)
nRBC: 0 % (ref 0.0–0.2)

## 2018-04-18 LAB — BASIC METABOLIC PANEL
Anion gap: 5 (ref 5–15)
BUN: 25 mg/dL — AB (ref 8–23)
CALCIUM: 8.2 mg/dL — AB (ref 8.9–10.3)
CHLORIDE: 107 mmol/L (ref 98–111)
CO2: 27 mmol/L (ref 22–32)
Creatinine, Ser: 0.95 mg/dL (ref 0.44–1.00)
GFR calc non Af Amer: 56 mL/min — ABNORMAL LOW (ref >60)
GLUCOSE: 108 mg/dL — AB (ref 70–99)
Potassium: 3.7 mmol/L (ref 3.5–5.1)
Sodium: 139 mmol/L (ref 135–145)

## 2018-04-18 LAB — PHOSPHORUS: Phosphorus: 2.9 mg/dL (ref 2.5–4.6)

## 2018-04-18 LAB — GLUCOSE, CAPILLARY
GLUCOSE-CAPILLARY: 144 mg/dL — AB (ref 70–99)
Glucose-Capillary: 102 mg/dL — ABNORMAL HIGH (ref 70–99)

## 2018-04-18 MED ORDER — ENALAPRIL MALEATE 5 MG PO TABS: 5.0000 mg | ORAL_TABLET | Freq: Every day | ORAL | 0 refills | Status: DC

## 2018-04-18 MED ORDER — LEVOFLOXACIN 750 MG PO TABS: 750.0000 mg | ORAL_TABLET | ORAL | 0 refills | Status: DC

## 2018-04-18 MED ORDER — LEVOFLOXACIN 750 MG PO TABS
750.0000 mg | ORAL_TABLET | ORAL | Status: DC
  Administered 2018-04-18: 750 mg via ORAL
  Filled 2018-04-18: qty 1

## 2018-04-18 MED ORDER — METOPROLOL SUCCINATE ER 25 MG PO TB24: 25.0000 mg | ORAL_TABLET | Freq: Every day | ORAL | 0 refills | Status: DC

## 2018-04-18 NOTE — Progress Notes (Signed)
Pharmacy Electrolyte Monitoring Consult:  Pharmacy consulted to assist in monitoring and replacing electrolytes in this 77 y.o. 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 Hypophosphatemia  Labs:  Sodium (mmol/L)  Date Value  04/18/2018 139  11/02/2014 144   Potassium (mmol/L)  Date Value  04/18/2018 3.7  11/02/2014 3.7   Phosphorus (mg/dL)  Date Value  40/98/1191 2.9   Calcium (mg/dL)  Date Value  47/82/9562 8.2 (L)   Calcium, Total (mg/dL)  Date Value  13/02/6577 9.2   Albumin (g/dL)  Date Value  46/96/2952 2.1 (L)  07/29/2014 4.0    Assessment/Plan: K 3.7  Phos 2.9  Scr 0.95  (Wt= 48 kg)   No supplementation needed at this time Pt for discharge today   Marty Heck 04/18/2018 11:58 AM

## 2018-04-18 NOTE — Discharge Summary (Signed)
[facility] - [place] at [facility]   PATIENT NAME: Tiffany Turner    MR#:  [id number]  DATE OF BIRTH:  05-19-1941  DATE OF ADMISSION:  04/14/2018 ADMITTING PHYSICIAN: Alford Highland, MD  DATE OF DISCHARGE: 04/18/2018  PRIMARY CARE PHYSICIAN: Patient, No Pcp Per    ADMISSION DIAGNOSIS:  Urinary retention [R33.9] Urinary tract infection with hematuria, site unspecified [N39.0, R31.9] Acute renal failure, unspecified acute renal failure type (HCC) [N17.9] Altered mental status, unspecified altered mental status type [R41.82]  DISCHARGE DIAGNOSIS:   Sepsis due to Left LL pneumonia [place] UTI 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 Coronary artery disease    stents placed  . Dementia (HCC)   . Hip dislocation, right (HCC)    hx of dislocation  . Hyperlipemia   . Hypertension     HOSPITAL COURSE:  RAEGYN RENDA is a 77 y.o. female with a history of coronary artery disease, hyperlipidemia, hypertension, dementia who presents to the ED for altered mental status and possible sepsis.  *Sepsis due to Left lower lobe community-acquired pneumonia with acute hypoxic respiratory failure Continue IV Levaquin--change to oral abx Cultures remain negative. Wean oxygen as tolerated Pt clinically stable BC negative WBC normal  *Klebsiella UTI On Levaquin  *Hypernatremia and acute renal failure -  Change IV fluids to D5.  Discussed with Dr. Wynelle Link of nephrology. Na now corrected. Creat at baseline  * Acute metabolic encephalopathy over baseline dementia Improving slowly.  * Essential hypertension. Current blood pressure okay. Continue to monitor. -resume BP meds at lower dose  *History of CAD.  Aspirin  Seen by Palliative care. Chris-son is HCPOA Pt is full code for now She will return to ALF with palliative care. Spoke with chris and agrees with plan.  CONSULTS  OBTAINED:    DRUG ALLERGIES:   Allergies  Allergen Reactions  . Penicillins Other (See Comments)    Has patient had a PCN reaction causing immediate rash, facial/tongue/throat swelling, SOB or lightheadedness with hypotension: Unknown Has patient had a PCN reaction causing severe rash involving mucus membranes or skin necrosis: Unknown Has patient had a PCN reaction that required hospitalization: Unknown Has patient had a PCN reaction occurring within the last 10 years: Unknown If all of the above answers are "NO", then may proceed with Cephalosporin use.  . Sulfa Antibiotics Other (See Comments)    DISCHARGE MEDICATIONS:   Allergies as of 04/18/2018      Reactions   Penicillins Other (See Comments)   Has patient had a PCN reaction causing immediate rash, facial/tongue/throat swelling, SOB or lightheadedness with hypotension: Unknown Has patient had a PCN reaction causing severe rash involving mucus membranes or skin necrosis: Unknown Has patient had a PCN reaction that required hospitalization: Unknown Has patient had a PCN reaction occurring within the last 10 years: Unknown If all of the above answers are "NO", then may proceed with Cephalosporin use.   Sulfa Antibiotics Other (See Comments)      Medication List    STOP taking these medications   ibuprofen 400 MG tablet Commonly known as:  ADVIL,MOTRIN     TAKE these medications   acetaminophen 500 MG tablet Commonly known as:  TYLENOL Take 500 mg by mouth every 4 (four) hours as needed for fever or headache.   aspirin 81 MG tablet Take 81 mg by mouth daily.   atorvastatin 40 MG tablet Commonly known as:  LIPITOR  Take 40 mg by mouth daily.   diclofenac sodium 1 % Gel Commonly known as:  VOLTAREN Apply 2 g topically 2 (two) times daily.   enalapril 5 MG tablet Commonly known as:  VASOTEC Take 1 tablet (5 mg total) by mouth daily. What changed:    medication strength  how much to take   fluticasone 50  MCG/ACT nasal spray Commonly known as:  FLONASE Place 2 sprays into both nostrils daily.   guaifenesin 100 MG/5ML syrup Commonly known as:  ROBITUSSIN Take 200 mg by mouth every 6 (six) hours as needed for cough.   levofloxacin 750 MG tablet Commonly known as:  LEVAQUIN Take 1 tablet (750 mg total) by mouth every other day.   loperamide 2 MG capsule Commonly known as:  IMODIUM Take 2 mg by mouth as needed for diarrhea or loose stools (max 8 doses daily).   LORazepam 0.5 MG tablet Commonly known as:  ATIVAN Take 0.5 mg by mouth 2 (two) times daily as needed for anxiety.   magnesium hydroxide 400 MG/5ML suspension Commonly known as:  MILK OF MAGNESIA Take 30 mLs by mouth daily as needed for mild constipation.   metoprolol succinate 25 MG 24 hr tablet Commonly known as:  TOPROL-XL Take 1 tablet (25 mg total) by mouth daily. Take with or immediately following a meal. What changed:    medication strength  how much to take   MINTOX REGULAR STRENGTH 200-200-20 MG/5ML suspension Generic drug:  alum & mag hydroxide-simeth Take 30 mLs by mouth every 6 (six) hours as needed for indigestion or heartburn.   morphine CONCENTRATE 10 mg / 0.5 ml concentrated solution Take 5 mg by mouth every hour as needed for severe pain, anxiety or shortness of breath.   neomycin-bacitracin-polymyxin Oint Commonly known as:  NEOSPORIN Apply 1 application topically as needed for wound care.   risperiDONE 1 MG tablet Commonly known as:  RISPERDAL Take 1 mg by mouth 2 (two) times daily.   rivastigmine 1.5 MG capsule Commonly known as:  EXELON Take 1.5 mg by mouth 2 (two) times daily.   SYSTANE 0.4-0.3 % Soln Generic drug:  Polyethyl Glycol-Propyl Glycol Place 1 drop into both eyes daily.   tetrahydrozoline 0.05 % ophthalmic solution Place 3 drops into both eyes at bedtime.   traMADol 50 MG tablet Commonly known as:  ULTRAM Take 50 mg by mouth 3 (three) times daily.   traZODone 50 MG  tablet Commonly known as:  DESYREL Take 50 mg by mouth at bedtime.   Valproate Sodium 250 MG/5ML Soln solution Commonly known as:  DEPAKENE Take 250 mg by mouth 2 (two) times daily.   Wh Petrol-Mineral Oil-Lanolin 0.1-0.1 % Oint Place 0.5 inches into the right eye 2 (two) times daily.       If you experience worsening of your admission symptoms, develop shortness of breath, life threatening emergency, suicidal or homicidal thoughts you must seek medical attention immediately by calling 911 or calling your MD immediately  if symptoms less severe.  You Must read complete instructions/literature along with all the possible adverse reactions/side effects for all the Medicines you take and that have been prescribed to you. Take any new Medicines after you have completely understood and accept all the possible adverse reactions/side effects.   Please note  You were cared for by a hospitalist during your hospital stay. If you have any questions about your discharge medications or the care you received while you were in the hospital after you are discharged,  you can call the unit and asked to speak with the hospitalist on call if the hospitalist that took care of you is not available. Once you are discharged, your primary care physician will handle any further medical issues. Please note that NO REFILLS for any discharge medications will be authorized once you are discharged, as it is imperative that you return to your primary care physician (or establish a relationship with a primary care physician if you do not have one) for your aftercare needs so that they can reassess your need for medications and monitor your lab values. Today   SUBJECTIVE   More alert  Today no fever No new issues per RN  VITAL SIGNS:  Blood pressure (!) 134/56, pulse 78, temperature 99 F (37.2 C), temperature source Oral, resp. rate 18, height 5\' 1"  (1.549 m), weight 48 kg, SpO2 96 %.  I/O:    Intake/Output  Summary (Last 24 hours) at 04/18/2018 0734 Last data filed at 04/17/2018 1457 Gross per 24 hour  Intake 600 ml  Output -  Net 600 ml    PHYSICAL EXAMINATION:  GENERAL:  77 y.o.-year-old patient lying in the bed with no acute distress.  EYES: Pupils equal, round, reactive to light and accommodation. No scleral icterus. Extraocular muscles intact.  HEENT: Head atraumatic, normocephalic. Oropharynx and nasopharynx clear.right eyelid congestion  NECK:  Supple, no jugular venous distention. No thyroid enlargement, no tenderness.  LUNGS: Normal breath sounds bilaterally, no wheezing, rales,rhonchi or crepitation. No use of accessory muscles of respiration.  CARDIOVASCULAR: S1, S2 normal. No murmurs, rubs, or gallops.  ABDOMEN: Soft, non-tender, non-distended. Bowel sounds present. No organomegaly or mass.  EXTREMITIES: No pedal edema, cyanosis, or clubbing.  NEUROLOGIC: Cranial nerves II through XII are intact.moves all extremities well PSYCHIATRIC: The patient is alert and oriented x2.  SKIN: No obvious rash, lesion, or ulcer.   DATA REVIEW:   CBC  Recent Labs  Lab 04/18/18 0411  WBC 8.0  HGB 7.8*  HCT 24.0*  PLT 287    Chemistries  Recent Labs  Lab 04/14/18 1425  04/18/18 0411  NA 143   < > 139  K 5.6*   < > 3.7  CL 117*   < > 107  CO2 16*   < > 27  GLUCOSE 265*   < > 108*  BUN 118*   < > 25*  CREATININE 3.50*   < > 0.95  CALCIUM 8.1*   < > 8.2*  AST 50*  --   --   ALT 20  --   --   ALKPHOS 62  --   --   BILITOT 0.6  --   --    < > = values in this interval not displayed.    Microbiology Results   Recent Results (from the past 240 hour(s))  Blood Culture (routine x 2)     Status: None (Preliminary result)   Collection Time: 04/14/18 10:58 AM  Result Value Ref Range Status   Specimen Description BLOOD RIGHT HAND  Final   Special Requests   Final    BOTTLES DRAWN AEROBIC AND ANAEROBIC Blood Culture adequate volume   Culture   Final    NO GROWTH 3  DAYS Performed at [facility], [address], [place], [place] [address]    Report Status PENDING  Incomplete  Blood Culture (routine x 2)     Status: None (Preliminary result)   Collection Time: 04/14/18 11:21 AM  Result Value Ref Range Status  Specimen Description BLOOD LEFT ARM  Final   Special Requests   Final    BOTTLES DRAWN AEROBIC AND ANAEROBIC Blood Culture results may not be optimal due to an excessive volume of blood received in culture bottles   Culture   Final    NO GROWTH 3 DAYS Performed at [facility], [address] [place], [place] [address]    Report Status PENDING  Incomplete  Urine culture     Status: Abnormal   Collection Time: 04/14/18 11:51 AM  Result Value Ref Range Status   Specimen Description   Final    URINE, RANDOM Performed at [facility], [address] [place], [place] [address]    Special Requests   Final    NONE Performed at [facility], [address] [place], [place] [address]    Culture >=100,000 COLONIES/mL KLEBSIELLA PNEUMONIAE (A)  Final   Report Status 04/16/2018 FINAL  Final   Organism ID, Bacteria KLEBSIELLA PNEUMONIAE (A)  Final      Susceptibility   Klebsiella pneumoniae - MIC*    AMPICILLIN >=32 RESISTANT Resistant     CEFAZOLIN <=4 SENSITIVE Sensitive     CEFTRIAXONE <=1 SENSITIVE Sensitive     CIPROFLOXACIN <=0.25 SENSITIVE Sensitive     GENTAMICIN <=1 SENSITIVE Sensitive     IMIPENEM 0.5 SENSITIVE Sensitive     NITROFURANTOIN 64 INTERMEDIATE Intermediate     TRIMETH/SULFA <=20 SENSITIVE Sensitive     AMPICILLIN/SULBACTAM 4 SENSITIVE Sensitive     PIP/TAZO <=4 SENSITIVE Sensitive     Extended ESBL NEGATIVE Sensitive     * >=100,000 COLONIES/mL KLEBSIELLA PNEUMONIAE  MRSA PCR Screening     Status: None   Collection Time: 04/15/18  3:39 AM  Result Value Ref Range Status   MRSA by PCR NEGATIVE NEGATIVE Final    Comment:        The GeneXpert MRSA Assay (FDA approved for NASAL  specimens only), is one component of a comprehensive MRSA colonization surveillance program. It is not intended to diagnose MRSA infection nor to guide or monitor treatment for MRSA infections. Performed at [facility], [address], [place], [place] [address]     RADIOLOGY:  No results found.   Management plans discussed with the patient, family and they are in agreement.  CODE STATUS:     Code Status Orders  (From admission, onward)         Start     Ordered   04/14/18 1511  Full code  Continuous     04/14/18 1511        Code Status History    This patient has a current code status but no historical code status.      TOTAL TIME TAKING CARE OF THIS PATIENT: **40* minutes.    Enedina Finner M.D on 04/18/2018 at 7:34 AM  Between 7am to 6pm - Pager - [phone] After 6pm go to [email or web address] - password [email or web address]  [facility] [place] [facility]  Office  [phone]  CC: Primary care physician; Patient, No Pcp Per

## 2018-04-18 NOTE — Progress Notes (Signed)
MD order received to discharge pt back to [facility] with Home Health Physical Therapy today; Care Management previously established HHPT with [facility] and prepared discharge packet for EMS personnel to take to the Assisted Living facility; 911 called for nonemergency transport; pt's discharge pending arrival of EMS personnel for discharge

## 2018-04-18 NOTE — Clinical Social Work Note (Addendum)
The patient will discharge today to [facility] ALF via non-emergent EMS. The patient's facility is aware and in agreement. The CSW has attempted to contact the patient's son/legal guardian with no success and no ability to leave VM. The attending MD has confirmed with the legal guardian per her note that he is in agreement with this plan. The CSW will deliver the discharge packet, at which point, the CSW will sign off. Please consult should needs arise.  UPDATE: The CSW was able to reach the patient's legal guardian. Chris asked that HHPT be ordered to assist the patient with strength building at the ALF. The CSW advised the medical team and the [facility] of the request and that the legal guardian has chosen [facility] for services.   Tiffany Turner, MSW, Theresia Majors [phone]

## 2018-04-18 NOTE — Progress Notes (Signed)
EMS present for pt discharge; discharge packet given to EMS personnel; pt discharged via stretcher back to the [facility]

## 2018-04-18 NOTE — Discharge Instructions (Addendum)
Pt to f/u with her PCP in 1 week  palliatve care to follow at [facility] house  [facility]

## 2018-04-19 LAB — CULTURE, BLOOD (ROUTINE X 2)
CULTURE: NO GROWTH
Culture: NO GROWTH
SPECIAL REQUESTS: ADEQUATE

## 2018-04-29 ENCOUNTER — Inpatient Hospital Stay
Admission: EM | Admit: 2018-04-29 | Discharge: 2018-05-01 | DRG: 690 | Disposition: A | Discharge status: Home Health | Payer: Medicare Other | Attending: Internal Medicine | Admitting: Internal Medicine

## 2018-04-29 ENCOUNTER — Emergency Department: Payer: Medicare Other

## 2018-04-29 ENCOUNTER — Encounter: Payer: Self-pay | Admitting: Emergency Medicine

## 2018-04-29 ENCOUNTER — Other Ambulatory Visit: Payer: Self-pay

## 2018-04-29 DIAGNOSIS — Z515 Encounter for palliative care: Secondary | ICD-10-CM | POA: Diagnosis present

## 2018-04-29 DIAGNOSIS — Y92129 Unspecified place in nursing home as the place of occurrence of the external cause: Secondary | ICD-10-CM

## 2018-04-29 DIAGNOSIS — F0281 Dementia in other diseases classified elsewhere with behavioral disturbance: Secondary | ICD-10-CM | POA: Diagnosis present

## 2018-04-29 DIAGNOSIS — Z96641 Presence of right artificial hip joint: Secondary | ICD-10-CM | POA: Diagnosis present

## 2018-04-29 DIAGNOSIS — Z955 Presence of coronary angioplasty implant and graft: Secondary | ICD-10-CM | POA: Diagnosis not present

## 2018-04-29 DIAGNOSIS — M25511 Pain in right shoulder: Secondary | ICD-10-CM | POA: Diagnosis present

## 2018-04-29 DIAGNOSIS — N390 Urinary tract infection, site not specified: Secondary | ICD-10-CM | POA: Diagnosis present

## 2018-04-29 DIAGNOSIS — G300 Alzheimer's disease with early onset: Secondary | ICD-10-CM | POA: Diagnosis present

## 2018-04-29 DIAGNOSIS — Z880 Allergy status to penicillin: Secondary | ICD-10-CM

## 2018-04-29 DIAGNOSIS — F17210 Nicotine dependence, cigarettes, uncomplicated: Secondary | ICD-10-CM | POA: Diagnosis present

## 2018-04-29 DIAGNOSIS — Z79899 Other long term (current) drug therapy: Secondary | ICD-10-CM

## 2018-04-29 DIAGNOSIS — E785 Hyperlipidemia, unspecified: Secondary | ICD-10-CM | POA: Diagnosis present

## 2018-04-29 DIAGNOSIS — Z7982 Long term (current) use of aspirin: Secondary | ICD-10-CM | POA: Diagnosis not present

## 2018-04-29 DIAGNOSIS — Z882 Allergy status to sulfonamides status: Secondary | ICD-10-CM | POA: Diagnosis not present

## 2018-04-29 DIAGNOSIS — W19XXXA Unspecified fall, initial encounter: Secondary | ICD-10-CM | POA: Diagnosis present

## 2018-04-29 DIAGNOSIS — Z66 Do not resuscitate: Secondary | ICD-10-CM | POA: Diagnosis present

## 2018-04-29 DIAGNOSIS — I959 Hypotension, unspecified: Secondary | ICD-10-CM | POA: Diagnosis present

## 2018-04-29 DIAGNOSIS — I2510 Atherosclerotic heart disease of native coronary artery without angina pectoris: Secondary | ICD-10-CM | POA: Diagnosis present

## 2018-04-29 DIAGNOSIS — I10 Essential (primary) hypertension: Secondary | ICD-10-CM | POA: Diagnosis present

## 2018-04-29 DIAGNOSIS — S0083XA Contusion of other part of head, initial encounter: Secondary | ICD-10-CM | POA: Diagnosis present

## 2018-04-29 DIAGNOSIS — Z7189 Other specified counseling: Secondary | ICD-10-CM | POA: Diagnosis not present

## 2018-04-29 DIAGNOSIS — Z8249 Family history of ischemic heart disease and other diseases of the circulatory system: Secondary | ICD-10-CM

## 2018-04-29 LAB — URINALYSIS, COMPLETE (UACMP) WITH MICROSCOPIC
Bacteria, UA: NONE SEEN
Bilirubin Urine: NEGATIVE
GLUCOSE, UA: NEGATIVE mg/dL
KETONES UR: NEGATIVE mg/dL
Nitrite: NEGATIVE
PROTEIN: NEGATIVE mg/dL
Specific Gravity, Urine: 1.011 (ref 1.005–1.030)
pH: 7 (ref 5.0–8.0)

## 2018-04-29 LAB — COMPREHENSIVE METABOLIC PANEL
ALT: 22 U/L (ref 0–44)
ANION GAP: 7 (ref 5–15)
AST: 40 U/L (ref 15–41)
Albumin: 2.8 g/dL — ABNORMAL LOW (ref 3.5–5.0)
Alkaline Phosphatase: 91 U/L (ref 38–126)
BILIRUBIN TOTAL: 0.4 mg/dL (ref 0.3–1.2)
BUN: 18 mg/dL (ref 8–23)
CHLORIDE: 101 mmol/L (ref 98–111)
CO2: 30 mmol/L (ref 22–32)
Calcium: 8.8 mg/dL — ABNORMAL LOW (ref 8.9–10.3)
Creatinine, Ser: 1.06 mg/dL — ABNORMAL HIGH (ref 0.44–1.00)
GFR calc Af Amer: 57 mL/min — ABNORMAL LOW (ref >60)
GFR calc non Af Amer: 49 mL/min — ABNORMAL LOW (ref >60)
Glucose, Bld: 142 mg/dL — ABNORMAL HIGH (ref 70–99)
POTASSIUM: 4.2 mmol/L (ref 3.5–5.1)
SODIUM: 138 mmol/L (ref 135–145)
TOTAL PROTEIN: 7.5 g/dL (ref 6.5–8.1)

## 2018-04-29 LAB — CBC WITH DIFFERENTIAL/PLATELET
Abs Immature Granulocytes: 0.04 10*3/uL (ref 0.00–0.07)
BASOS PCT: 0 %
Basophils Absolute: 0 10*3/uL (ref 0.0–0.1)
EOS ABS: 0.1 10*3/uL (ref 0.0–0.5)
Eosinophils Relative: 1 %
HCT: 29.3 % — ABNORMAL LOW (ref 36.0–46.0)
Hemoglobin: 9.4 g/dL — ABNORMAL LOW (ref 12.0–15.0)
Immature Granulocytes: 1 %
Lymphocytes Relative: 21 %
Lymphs Abs: 1.6 10*3/uL (ref 0.7–4.0)
MCH: 34.6 pg — AB (ref 26.0–34.0)
MCHC: 32.1 g/dL (ref 30.0–36.0)
MCV: 107.7 fL — ABNORMAL HIGH (ref 80.0–100.0)
MONO ABS: 0.8 10*3/uL (ref 0.1–1.0)
Monocytes Relative: 11 %
NEUTROS PCT: 66 %
Neutro Abs: 5 10*3/uL (ref 1.7–7.7)
PLATELETS: 352 10*3/uL (ref 150–400)
RBC: 2.72 MIL/uL — ABNORMAL LOW (ref 3.87–5.11)
RDW: 13.2 % (ref 11.5–15.5)
WBC: 7.5 10*3/uL (ref 4.0–10.5)
nRBC: 0 % (ref 0.0–0.2)

## 2018-04-29 LAB — LACTIC ACID, PLASMA
LACTIC ACID, VENOUS: 1.5 mmol/L (ref 0.5–1.9)
Lactic Acid, Venous: 1.9 mmol/L (ref 0.5–1.9)

## 2018-04-29 LAB — TROPONIN I

## 2018-04-29 MED ORDER — ALUM & MAG HYDROXIDE-SIMETH 200-200-20 MG/5ML PO SUSP: 30.0000 mL | Freq: Four times a day (QID) | ORAL | Status: DC | PRN

## 2018-04-29 MED ORDER — ONDANSETRON HCL 4 MG PO TABS: 4.0000 mg | ORAL_TABLET | Freq: Four times a day (QID) | ORAL | Status: DC | PRN

## 2018-04-29 MED ORDER — LOPERAMIDE HCL 2 MG PO CAPS: 2.0000 mg | ORAL_CAPSULE | ORAL | Status: DC | PRN

## 2018-04-29 MED ORDER — SODIUM CHLORIDE 0.9 % IV SOLN
INTRAVENOUS | Status: DC
  Administered 2018-04-29 – 2018-05-01 (×3): via INTRAVENOUS

## 2018-04-29 MED ORDER — ACETAMINOPHEN 325 MG PO TABS: 650.0000 mg | ORAL_TABLET | Freq: Four times a day (QID) | ORAL | Status: DC | PRN

## 2018-04-29 MED ORDER — LORAZEPAM 0.5 MG PO TABS: 0.5000 mg | ORAL_TABLET | Freq: Two times a day (BID) | ORAL | Status: DC | PRN

## 2018-04-29 MED ORDER — MAGNESIUM HYDROXIDE 400 MG/5ML PO SUSP
30.0000 mL | Freq: Every day | ORAL | Status: DC | PRN
  Filled 2018-04-29: qty 30

## 2018-04-29 MED ORDER — ENOXAPARIN SODIUM 40 MG/0.4ML ~~LOC~~ SOLN
40.0000 mg | SUBCUTANEOUS | Status: DC
  Administered 2018-04-29 – 2018-04-30 (×2): 40 mg via SUBCUTANEOUS
  Filled 2018-04-29 (×2): qty 0.4

## 2018-04-29 MED ORDER — ASPIRIN EC 81 MG PO TBEC
81.0000 mg | DELAYED_RELEASE_TABLET | Freq: Every day | ORAL | Status: DC
  Administered 2018-04-30 – 2018-05-01 (×2): 81 mg via ORAL
  Filled 2018-04-29 (×2): qty 1

## 2018-04-29 MED ORDER — TRAZODONE HCL 50 MG PO TABS
50.0000 mg | ORAL_TABLET | Freq: Every day | ORAL | Status: DC
  Administered 2018-04-29 – 2018-04-30 (×2): 50 mg via ORAL
  Filled 2018-04-29 (×2): qty 1

## 2018-04-29 MED ORDER — GUAIFENESIN 100 MG/5ML PO SOLN
200.0000 mg | Freq: Four times a day (QID) | ORAL | Status: DC | PRN
  Filled 2018-04-29: qty 10

## 2018-04-29 MED ORDER — NAPHAZOLINE-GLYCERIN 0.012-0.2 % OP SOLN
2.0000 [drp] | Freq: Four times a day (QID) | OPHTHALMIC | Status: DC | PRN
  Filled 2018-04-29: qty 15

## 2018-04-29 MED ORDER — ONDANSETRON HCL 4 MG/2ML IJ SOLN: 4.0000 mg | Freq: Four times a day (QID) | INTRAMUSCULAR | Status: DC | PRN

## 2018-04-29 MED ORDER — CIPROFLOXACIN IN D5W 400 MG/200ML IV SOLN
400.0000 mg | Freq: Once | INTRAVENOUS | Status: AC
  Administered 2018-04-29: 400 mg via INTRAVENOUS
  Filled 2018-04-29: qty 200

## 2018-04-29 MED ORDER — NYSTATIN 100000 UNIT/GM EX POWD
1.0000 g | Freq: Every day | CUTANEOUS | Status: DC | PRN
  Filled 2018-04-29: qty 15

## 2018-04-29 MED ORDER — POLYVINYL ALCOHOL 1.4 % OP SOLN
1.0000 [drp] | Freq: Every day | OPHTHALMIC | Status: DC | PRN
  Filled 2018-04-29: qty 15

## 2018-04-29 MED ORDER — ACETAMINOPHEN 650 MG RE SUPP: 650.0000 mg | Freq: Four times a day (QID) | RECTAL | Status: DC | PRN

## 2018-04-29 MED ORDER — NITROFURANTOIN MONOHYD MACRO 100 MG PO CAPS
100.0000 mg | ORAL_CAPSULE | Freq: Once | ORAL | Status: AC
  Administered 2018-04-29: 100 mg via ORAL
  Filled 2018-04-29: qty 1

## 2018-04-29 MED ORDER — VALPROIC ACID 250 MG/5ML PO SOLN
250.0000 mg | Freq: Two times a day (BID) | ORAL | Status: DC
  Administered 2018-04-29 – 2018-05-01 (×4): 250 mg via ORAL
  Filled 2018-04-29 (×7): qty 5

## 2018-04-29 MED ORDER — RISPERIDONE 1 MG PO TABS
1.0000 mg | ORAL_TABLET | Freq: Two times a day (BID) | ORAL | Status: DC
  Administered 2018-04-29 – 2018-05-01 (×4): 1 mg via ORAL
  Filled 2018-04-29 (×5): qty 1

## 2018-04-29 MED ORDER — ATORVASTATIN CALCIUM 20 MG PO TABS
40.0000 mg | ORAL_TABLET | Freq: Every day | ORAL | Status: DC
  Administered 2018-04-30 – 2018-05-01 (×2): 40 mg via ORAL
  Filled 2018-04-29 (×2): qty 2

## 2018-04-29 MED ORDER — SODIUM CHLORIDE 0.9 % IV BOLUS
1000.0000 mL | Freq: Once | INTRAVENOUS | Status: AC
  Administered 2018-04-29: 1000 mL via INTRAVENOUS

## 2018-04-29 MED ORDER — RIVASTIGMINE TARTRATE 1.5 MG PO CAPS
1.5000 mg | ORAL_CAPSULE | Freq: Two times a day (BID) | ORAL | Status: DC
  Administered 2018-04-29 – 2018-04-30 (×2): 1.5 mg via ORAL
  Filled 2018-04-29 (×3): qty 1

## 2018-04-29 MED ORDER — CIPROFLOXACIN IN D5W 200 MG/100ML IV SOLN
200.0000 mg | Freq: Two times a day (BID) | INTRAVENOUS | Status: DC
  Administered 2018-04-30: 200 mg via INTRAVENOUS
  Filled 2018-04-29 (×2): qty 100

## 2018-04-29 MED ORDER — TRAMADOL HCL 50 MG PO TABS
50.0000 mg | ORAL_TABLET | Freq: Three times a day (TID) | ORAL | Status: DC
  Administered 2018-04-29 – 2018-05-01 (×5): 50 mg via ORAL
  Filled 2018-04-29 (×5): qty 1

## 2018-04-29 MED ORDER — ARTIFICIAL TEARS OPHTHALMIC OINT
1.0000 "application " | TOPICAL_OINTMENT | Freq: Two times a day (BID) | OPHTHALMIC | Status: DC
  Administered 2018-04-29 – 2018-05-01 (×4): 1 via OPHTHALMIC
  Filled 2018-04-29: qty 3.5

## 2018-04-29 MED ORDER — FLUTICASONE PROPIONATE 50 MCG/ACT NA SUSP
2.0000 | Freq: Every day | NASAL | Status: DC
  Administered 2018-04-30 – 2018-05-01 (×2): 2 via NASAL
  Filled 2018-04-29: qty 16

## 2018-04-29 NOTE — ED Notes (Signed)
Jenn RN, aware of bed assigned  

## 2018-04-29 NOTE — ED Triage Notes (Signed)
Pt to ED via EMS from [facility] dementia unit c/o unwitnessed fall today.  Pt states was trying to get out of wheelchair and fell, denies LOC.  Per EMS patient at baseline mentation, baseline disoriented to place and time.  EMS vitals 111/83, 103 HR, 98% RA.  Presents with abrasion above left eye, also c/o left hip pain but able to move hip on her own.

## 2018-04-29 NOTE — ED Provider Notes (Signed)
[facility] Emergency Department Provider Note  ____________________________________________   I have reviewed the triage vital signs and the nursing notes. Where available I have reviewed prior notes and, if possible and indicated, outside hospital notes.    HISTORY  Chief Complaint Fall    HPI ZAYLEY ARRAS is a 77 y.o. female  has a history of significant dementia and cannot give a history. According to EMS, patient had a fall. Unable to know if she was otherwise unwell.  cannot further pain history second to patient status. Her EMS report does include the fact that she has an abrasion over her forehead. patient told someone initially that her hip is hurting and that she told me her shoulders were hurting.    Past Medical History:  Diagnosis Date  . Coronary artery disease    stents placed  . Dementia (HCC)   . Hip dislocation, right (HCC)    hx of dislocation  . Hyperlipemia   . Hypertension     Patient Active Problem List   Diagnosis Date Noted  . Palliative care by specialist   . Goals of care, counseling/discussion   . Mild dementia (HCC)   . Altered mental status   . Acute renal failure (HCC)   . Acute cystitis 04/14/2018  . 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 Dementia, Alzheimer's, with behavior disturbance (HCC) 02/16/2015  . Hypertension 02/16/2015    Past Surgical History:  Procedure Laterality Date  . CARDIAC SURGERY     stents  . CORONARY ANGIOPLASTY WITH STENT PLACEMENT    . TOTAL HIP ARTHROPLASTY Right     Prior to Admission medications   Medication Sig Start Date End Date Taking? Authorizing Provider  acetaminophen (TYLENOL) 500 MG tablet Take 500 mg by mouth every 4 (four) hours as needed for fever or headache.   Yes [provider]  alum & mag hydroxide-simeth (MINTOX REGULAR STRENGTH) 200-200-20 MG/5ML suspension Take 30 mLs by mouth every 6 (six) hours  as needed for indigestion or heartburn.   Yes [provider]  Artificial Tear Ointment (WH PETROL-MINERAL OIL-LANOLIN) 0.1-0.1 % OINT Place 0.5 inches into the right eye 2 (two) times daily. 03/31/18  Yes Sharman Cheek, MD  aspirin 81 MG tablet Take 81 mg by mouth daily.    Yes [provider]  atorvastatin (LIPITOR) 40 MG tablet Take 40 mg by mouth daily.   Yes [provider]  diclofenac sodium (VOLTAREN) 1 % GEL Apply 2 g topically 2 (two) times daily.   Yes [provider]  enalapril (VASOTEC) 5 MG tablet Take 1 tablet (5 mg total) by mouth daily. 04/18/18  Yes Enedina Finner, MD  fluticasone (FLONASE) 50 MCG/ACT nasal spray Place 2 sprays into both nostrils daily.    Yes [provider]  guaifenesin (ROBITUSSIN) 100 MG/5ML syrup Take 200 mg by mouth every 6 (six) hours as needed for cough.   Yes [provider]  loperamide (IMODIUM) 2 MG capsule Take 2 mg by mouth as needed for diarrhea or loose stools (max 8 doses daily).   Yes [provider]  LORazepam (ATIVAN) 0.5 MG tablet Take 0.5 mg by mouth 2 (two) times daily as needed for anxiety.   Yes [provider]  magnesium hydroxide (MILK OF MAGNESIA) 400 MG/5ML suspension Take 30 mLs by mouth daily as needed for mild constipation.   Yes [provider]  metoprolol succinate (TOPROL-XL) 25 MG 24 hr tablet  Take 1 tablet (25 mg total) by mouth daily. Take with or immediately following a meal. 04/18/18  Yes Enedina Finner, MD  Morphine Sulfate (MORPHINE CONCENTRATE) 10 mg / 0.5 ml concentrated solution Take 5 mg by mouth every hour as needed for severe pain, anxiety or shortness of breath.   Yes [provider]  neomycin-bacitracin-polymyxin (NEOSPORIN) OINT Apply 1 application topically as needed for wound care.   Yes [provider]  NITROFURANTOIN MACROCRYSTAL PO Take 100 mg by mouth daily.   Yes [provider]  nystatin (NYSTATIN) powder  Apply 1 g topically daily as needed (skin irritation).   Yes [provider]  Polyethyl Glycol-Propyl Glycol (SYSTANE) 0.4-0.3 % SOLN Place 1 drop into both eyes daily.   Yes [provider]  risperiDONE (RISPERDAL) 1 MG tablet Take 1 mg by mouth 2 (two) times daily.   Yes [provider]  rivastigmine (EXELON) 1.5 MG capsule Take 1.5 mg by mouth 2 (two) times daily.   Yes [provider]  tetrahydrozoline 0.05 % ophthalmic solution Place 3 drops into both eyes at bedtime.   Yes [provider]  traMADol (ULTRAM) 50 MG tablet Take 50 mg by mouth 3 (three) times daily.   Yes [provider]  traZODone (DESYREL) 50 MG tablet Take 50 mg by mouth at bedtime.   Yes [provider]  Valproate Sodium (DEPAKENE) 250 MG/5ML SOLN solution Take 250 mg by mouth 2 (two) times daily.   Yes [provider]  levofloxacin (LEVAQUIN) 750 MG tablet Take 1 tablet (750 mg total) by mouth every other day. Patient not taking: Reported on 04/29/2018 04/18/18   Enedina Finner, MD    Allergies Penicillins and Sulfa antibiotics  Family History  Problem Relation Age of Onset  . CAD Father     Social History Social History   Tobacco Use  . Smoking status: Current Every Day Smoker    Packs/day: 0.50    Types: Cigarettes  . Smokeless tobacco: Never Used  Substance Use Topics  . Alcohol use: No  . Drug use: No    Review of Systems cannot further evaluate secondary to patient dementia  ____________________________________________   PHYSICAL EXAM:  VITAL SIGNS: ED Triage Vitals  Enc Vitals Group     BP 04/29/18 1621 (!) 105/51     Pulse Rate 04/29/18 1621 93     Resp 04/29/18 1621 19     Temp 04/29/18 1621 98.8 F (37.1 C)     Temp Source 04/29/18 1621 Oral     SpO2 04/29/18 1621 96 %     Weight 04/29/18 1621 105 lb 13.1 oz (48 kg)     Height 04/29/18 1621 5\' 1"  (1.549 m)     Head Circumference --      Peak Flow --      Pain  Score 04/29/18 1623 2     Pain Loc --      Pain Edu? --      Excl. in GC? --     Constitutional: somnolent but arousable in no acute medical distress Eyes: Conjunctivae are normal Head: region noted to forehead HEENT: No congestion/rhinnorhea. Mucous membranes are moist.  Oropharynx non-erythematous Neck:   Nontender with no meningismus, no masses, no stridorno tenderness noted to the neck on palpation or ranging Cardiovascular: Normal rate, regular rhythm. Grossly normal heart sounds.  Good peripheral circulation. Respiratory: Normal respiratory effort.  No retractions. Lungs CTAB. Abdominal: Soft and nontender. No distention. No guarding no rebound  Back:  There is no focal tenderness or step off.  there is no midline tenderness there are no lesions noted. there is no CVA tenderness Musculoskeletal: No lower extremity tenderness, no upper extremity tenderness. No joint effusions, no DVT signs strong distal pulses no edema Neurologic:  Normal speech and language. No gross focal neurologic deficits are appreciated.  Skin:  Skin is warm, dry and intact. No rash noted. Psychiatric: Mood and affect are pleasantly demented and somnolent. Speech and behavior are normal.  ____________________________________________   LABS (all labs ordered are listed, but only abnormal results are displayed)  Labs Reviewed  COMPREHENSIVE METABOLIC PANEL - Abnormal; Notable for the following components:      Result Value   Glucose, Bld 142 (*)    Creatinine, Ser 1.06 (*)    Calcium 8.8 (*)    Albumin 2.8 (*)    GFR calc non Af Amer 49 (*)    GFR calc Af Amer 57 (*)    All other components within normal limits  URINALYSIS, COMPLETE (UACMP) WITH MICROSCOPIC - Abnormal; Notable for the following components:   Color, Urine YELLOW (*)    APPearance CLEAR (*)    Hgb urine dipstick MODERATE (*)    Leukocytes, UA MODERATE (*)    WBC, UA >50 (*)    All other components within normal limits  CBC WITH  DIFFERENTIAL/PLATELET - Abnormal; Notable for the following components:   RBC 2.72 (*)    Hemoglobin 9.4 (*)    HCT 29.3 (*)    MCV 107.7 (*)    MCH 34.6 (*)    All other components within normal limits  URINE CULTURE  CULTURE, BLOOD (ROUTINE X 2)  CULTURE, BLOOD (ROUTINE X 2)  TROPONIN I  LACTIC ACID, PLASMA  LACTIC ACID, PLASMA    Pertinent labs  results that were available during my care of the patient were reviewed by me and considered in my medical decision making (see chart for details). ____________________________________________  EKG  I personally interpreted any EKGs ordered by me or triage sinus rhythm at 94 bpm no acute ST elevation or depression normal axis, no acute ischemia ____________________________________________  RADIOLOGY  Pertinent labs & imaging results that were available during my care of the patient were reviewed by me and considered in my medical decision making (see chart for details). If possible, patient and/or family made aware of any abnormal findings.  Dg Shoulder Right  Result Date: 04/29/2018 CLINICAL DATA:  Acute RIGHT shoulder pain following fall today. Initial encounter. EXAM: RIGHT SHOULDER - 2+ VIEW COMPARISON:  None. FINDINGS: There is no evidence of fracture or dislocation. There is no evidence of arthropathy or other focal bone abnormality. Soft tissues are unremarkable. IMPRESSION: Negative. Electronically Signed   By: Harmon Pier M.D.   On: 04/29/2018 18:52   Ct Head Wo Contrast  Result Date: 04/29/2018 CLINICAL DATA:  77 y/o F; unwitnessed fall today. Abrasion above the left eye. EXAM: CT HEAD WITHOUT CONTRAST TECHNIQUE: Contiguous axial images were obtained from the base of the skull through the vertex without intravenous contrast. COMPARISON:  None. FINDINGS: Brain: No evidence of acute infarction, hemorrhage, hydrocephalus, extra-axial collection or mass lesion/mass effect. Encephalomalacia of the right anterior superior frontal  lobe, left anterior inferior frontal lobe, as well as left anterior temporal lobe. Vascular: Calcific atherosclerosis of carotid siphons and vertebral arteries. No hyperdense vessel identified. Skull: Postsurgical changes of the frontal bones and orbital rims, partially visualized. No acute fracture identified. Sinuses/Orbits: Normal aeration of  the visible paranasal sinuses. Left mastoid opacification. Other: None. IMPRESSION: 1. No acute intracranial abnormality or calvarial fracture identified. 2. 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 Findings may represent sequelae of infarction or traumatic brain injury. 3. Left mastoid opacification. Electronically Signed   By: Mitzi Hansen M.D.   On: 04/29/2018 16:53   Dg Shoulder Left  Result Date: 04/29/2018 CLINICAL DATA:  Acute LEFT shoulder pain following fall today. Initial encounter. EXAM: LEFT SHOULDER - 2+ VIEW COMPARISON:  None. FINDINGS: There is no evidence of fracture or dislocation. There is no evidence of arthropathy or other focal bone abnormality. Soft tissues are unremarkable. IMPRESSION: Negative. Electronically Signed   By: Harmon Pier M.D.   On: 04/29/2018 18:51   Dg Hip Unilat With Pelvis 2-3 Views Left  Result Date: 04/29/2018 CLINICAL DATA:  77 y/o  F; unwitnessed fall. Left hip pain. EXAM: DG HIP (WITH OR WITHOUT PELVIS) 2-3V LEFT COMPARISON:  None. FINDINGS: No acute fracture or dislocation identified. No pelvic fracture or diastasis. Bones are demineralized. Total right hip arthroplasty, partially visualized. Partially visualized chronic fracture deformity of the left mid femoral shaft. IMPRESSION: No acute fracture or dislocation identified. If suspicion for fracture persists, consider hip MRI or hip CT if MRI is contraindicated/unavailable for further evaluation. Electronically Signed   By: Mitzi Hansen M.D.   On: 04/29/2018  17:09   ____________________________________________    PROCEDURES  Procedure(s) performed: None  Procedures  Critical Care performed: None  ____________________________________________   INITIAL IMPRESSION / ASSESSMENT AND PLAN / ED COURSE  Pertinent labs & imaging results that were available during my care of the patient were reviewed by me and considered in my medical decision making (see chart for details).  patient here after a fall has a history of frequent falls, CT head is negative unclear why she fell we do however note that her blood pressures are low here,when she does have a urinary tract infection which has multiple resistances prior culture. Patient is also allergic to multiple antibiotics making outpatient care fraught. Giving iv cipro and will admit.     ____________________________________________   FINAL CLINICAL IMPRESSION(S) / ED DIAGNOSES  Final diagnoses:  None      This chart was dictated using voice recognition software.  Despite best efforts to proofread,  errors can occur which can change meaning.      Jeanmarie Plant, MD 04/29/18 2050

## 2018-04-29 NOTE — H&P (Signed)
[facility]    PATIENT NAME: Tiffany Turner    MR#:  [id number]  DATE OF BIRTH:  [birth year]  DATE OF ADMISSION:  04/29/2018  PRIMARY CARE PHYSICIAN: Patient, No Pcp Per   REQUESTING/REFERRING PHYSICIAN: Dr. Ileana Roup  CHIEF COMPLAINT:   Chief Complaint  Patient presents with  . Fall    HISTORY OF PRESENT ILLNESS:  Tiffany Turner  is a 77 y.o. female with a known history of advanced dementia, coronary artery disease, hypertension, hyperlipidemia recent admission for a pneumonia and urinary tract infection who presents to the hospital from a skilled nursing facility after a fall.  The patient is a very poor historian and there is no family at bedside therefore most of the history obtained from the ER physician and from the chart.  Patient apparently had a witnessed fall at the skilled nursing/assisted living and therefore presents to the hospital for further evaluation.  Pt. was noted to underwent a CT of the head which was negative for acute pathology, her x-rays of her shoulders and hip are also negative for any acute fracture.  Patient's urinalysis was positive for UTI and she was also noted to be hypotensive with systolic blood pressures in the 80s and therefore hospitalist services were contacted for admission.  PAST MEDICAL HISTORY:   Past Medical History:  Diagnosis Date  . Coronary artery disease    stents placed  . Dementia (HCC)   . Hip dislocation, right (HCC)    hx of dislocation  . Hyperlipemia   . Hypertension     PAST SURGICAL HISTORY:   Past Surgical History:  Procedure Laterality Date  . CARDIAC SURGERY     stents  . CORONARY ANGIOPLASTY WITH STENT PLACEMENT    . TOTAL HIP ARTHROPLASTY Right     SOCIAL HISTORY:   Social History   Tobacco Use  . Smoking status: Current Every Day Smoker    Packs/day: 0.50    Types: Cigarettes  . Smokeless tobacco: Never Used  Substance Use Topics  . Alcohol use: No      FAMILY HISTORY:   Family History  Problem Relation Age of Onset  . CAD Father     DRUG ALLERGIES:   Allergies  Allergen Reactions  . Penicillins Other (See Comments)    Has patient had a PCN reaction causing immediate rash, facial/tongue/throat swelling, SOB or lightheadedness with hypotension: Unknown Has patient had a PCN reaction causing severe rash involving mucus membranes or skin necrosis: Unknown Has patient had a PCN reaction that required hospitalization: Unknown Has patient had a PCN reaction occurring within the last 10 years: Unknown If all of the above answers are "NO", then may proceed with Cephalosporin use.  . Sulfa Antibiotics Other (See Comments)    unknown    REVIEW OF SYSTEMS:   Review of Systems  Unable to perform ROS: Dementia    MEDICATIONS AT HOME:   Prior to Admission medications   Medication Sig Start Date End Date Taking? Authorizing Provider  acetaminophen (TYLENOL) 500 MG tablet Take 500 mg by mouth every 4 (four) hours as needed for fever or headache.   Yes [provider]  alum & mag hydroxide-simeth (MINTOX REGULAR STRENGTH) 200-200-20 MG/5ML suspension Take 30 mLs by mouth every 6 (six) hours as needed for indigestion or heartburn.   Yes [provider]  Artificial Tear Ointment (WH PETROL-MINERAL OIL-LANOLIN) 0.1-0.1 % OINT Place 0.5 inches into the right eye 2 (two) times daily. 03/31/18  Yes Sharman Cheek, MD  aspirin 81 MG tablet Take 81 mg by mouth daily.    Yes [provider]  atorvastatin (LIPITOR) 40 MG tablet Take 40 mg by mouth daily.   Yes [provider]  diclofenac sodium (VOLTAREN) 1 % GEL Apply 2 g topically 2 (two) times daily.   Yes [provider]  enalapril (VASOTEC) 5 MG tablet Take 1 tablet (5 mg total) by mouth daily. 04/18/18  Yes Enedina Finner, MD  fluticasone (FLONASE) 50 MCG/ACT nasal spray Place 2 sprays into both nostrils daily.    Yes [provider]   guaifenesin (ROBITUSSIN) 100 MG/5ML syrup Take 200 mg by mouth every 6 (six) hours as needed for cough.   Yes [provider]  loperamide (IMODIUM) 2 MG capsule Take 2 mg by mouth as needed for diarrhea or loose stools (max 8 doses daily).   Yes [provider]  LORazepam (ATIVAN) 0.5 MG tablet Take 0.5 mg by mouth 2 (two) times daily as needed for anxiety.   Yes [provider]  magnesium hydroxide (MILK OF MAGNESIA) 400 MG/5ML suspension Take 30 mLs by mouth daily as needed for mild constipation.   Yes [provider]  metoprolol succinate (TOPROL-XL) 25 MG 24 hr tablet Take 1 tablet (25 mg total) by mouth daily. Take with or immediately following a meal. 04/18/18  Yes Enedina Finner, MD  Morphine Sulfate (MORPHINE CONCENTRATE) 10 mg / 0.5 ml concentrated solution Take 5 mg by mouth every hour as needed for severe pain, anxiety or shortness of breath.   Yes [provider]  neomycin-bacitracin-polymyxin (NEOSPORIN) OINT Apply 1 application topically as needed for wound care.   Yes [provider]  NITROFURANTOIN MACROCRYSTAL PO Take 100 mg by mouth daily.   Yes [provider]  nystatin (NYSTATIN) powder Apply 1 g topically daily as needed (skin irritation).   Yes [provider]  Polyethyl Glycol-Propyl Glycol (SYSTANE) 0.4-0.3 % SOLN Place 1 drop into both eyes daily.   Yes [provider]  risperiDONE (RISPERDAL) 1 MG tablet Take 1 mg by mouth 2 (two) times daily.   Yes [provider]  rivastigmine (EXELON) 1.5 MG capsule Take 1.5 mg by mouth 2 (two) times daily.   Yes [provider]  tetrahydrozoline 0.05 % ophthalmic solution Place 3 drops into both eyes at bedtime.   Yes [provider]  traMADol (ULTRAM) 50 MG tablet Take 50 mg by mouth 3 (three) times daily.   Yes [provider]  traZODone (DESYREL) 50 MG tablet Take 50 mg by mouth at bedtime.   Yes [provider]   Valproate Sodium (DEPAKENE) 250 MG/5ML SOLN solution Take 250 mg by mouth 2 (two) times daily.   Yes [provider]  levofloxacin (LEVAQUIN) 750 MG tablet Take 1 tablet (750 mg total) by mouth every other day. Patient not taking: Reported on 04/29/2018 04/18/18   Enedina Finner, MD      VITAL SIGNS:  Blood pressure (!) 101/58, pulse 87, temperature 98.8 F (37.1 C), temperature source Oral, resp. rate 19, height 5\' 1"  (1.549 m), weight 48 kg, SpO2 95 %.  PHYSICAL EXAMINATION:  Physical Exam  GENERAL:  77 y.o.-year-old thin patient lying in the bed in no acute distress.  EYES: Pupils equal, round, reactive to light and accommodation. No scleral icterus. Extraocular muscles intact.  Right eye conjunctival injection HEENT: Patient has a bruise on her left forehead. Oropharynx and nasopharynx clear. No oropharyngeal erythema, moist oral  mucosa  NECK:  Supple, no jugular venous distention. No thyroid enlargement, no tenderness.  LUNGS: Normal breath sounds bilaterally, no wheezing, rales, rhonchi. No use of accessory muscles of respiration.  CARDIOVASCULAR: S1, S2 RRR. No murmurs, rubs, gallops, clicks.  ABDOMEN: Soft, nontender, nondistended. Bowel sounds present. No organomegaly or mass.  EXTREMITIES: +1-2 dependent edema b/l, No cyanosis, or clubbing. + 2 pedal & radial pulses b/l.   NEUROLOGIC: Cranial nerves II through XII are intact. No focal Motor or sensory deficits appreciated b/l. Globally weak.  PSYCHIATRIC: The patient is alert and oriented x 1. Flat affect.  SKIN: No obvious rash, lesion, or ulcer.   LABORATORY PANEL:   CBC Recent Labs  Lab 04/29/18 1803  WBC 7.5  HGB 9.4*  HCT 29.3*  PLT 352   ------------------------------------------------------------------------------------------------------------------  Chemistries  Recent Labs  Lab 04/29/18 1803  NA 138  K 4.2  CL 101  CO2 30  GLUCOSE 142*  BUN 18  CREATININE 1.06*  CALCIUM 8.8*  AST 40  ALT  22  ALKPHOS 91  BILITOT 0.4   ------------------------------------------------------------------------------------------------------------------  Cardiac Enzymes Recent Labs  Lab 04/29/18 1803  TROPONINI <0.03   ------------------------------------------------------------------------------------------------------------------  RADIOLOGY:  Dg Shoulder Right  Result Date: 04/29/2018 CLINICAL DATA:  Acute RIGHT shoulder pain following fall today. Initial encounter. EXAM: RIGHT SHOULDER - 2+ VIEW COMPARISON:  None. FINDINGS: There is no evidence of fracture or dislocation. There is no evidence of arthropathy or other focal bone abnormality. Soft tissues are unremarkable. IMPRESSION: Negative. Electronically Signed   By: Harmon Pier M.D.   On: 04/29/2018 18:52   Ct Head Wo Contrast  Result Date: 04/29/2018 CLINICAL DATA:  77 y/o F; unwitnessed fall today. Abrasion above the left eye. EXAM: CT HEAD WITHOUT CONTRAST TECHNIQUE: Contiguous axial images were obtained from the base of the skull through the vertex without intravenous contrast. COMPARISON:  None. FINDINGS: Brain: No evidence of acute infarction, hemorrhage, hydrocephalus, extra-axial collection or mass lesion/mass effect. Encephalomalacia of the right anterior superior frontal lobe, left anterior inferior frontal lobe, as well as left anterior temporal lobe. Vascular: Calcific atherosclerosis of carotid siphons and vertebral arteries. No hyperdense vessel identified. Skull: Postsurgical changes of the frontal bones and orbital rims, partially visualized. No acute fracture identified. Sinuses/Orbits: Normal aeration of the visible paranasal sinuses. Left mastoid opacification. Other: None. IMPRESSION: 1. No acute intracranial abnormality or calvarial fracture identified. 2. 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 Findings may represent sequelae of  infarction or traumatic brain injury. 3. Left mastoid opacification. Electronically Signed   By: Mitzi Hansen M.D.   On: 04/29/2018 16:53   Dg Shoulder Left  Result Date: 04/29/2018 CLINICAL DATA:  Acute LEFT shoulder pain following fall today. Initial encounter. EXAM: LEFT SHOULDER - 2+ VIEW COMPARISON:  None. FINDINGS: There is no evidence of fracture or dislocation. There is no evidence of arthropathy or other focal bone abnormality. Soft tissues are unremarkable. IMPRESSION: Negative. Electronically Signed   By: Harmon Pier M.D.   On: 04/29/2018 18:51   Dg Hip Unilat With Pelvis 2-3 Views Left  Result Date: 04/29/2018 CLINICAL DATA:  77 y/o  F; unwitnessed fall. Left hip pain. EXAM: DG HIP (WITH OR WITHOUT PELVIS) 2-3V LEFT COMPARISON:  None. FINDINGS: No acute fracture or dislocation identified. No pelvic fracture or diastasis. Bones are demineralized. Total right hip arthroplasty, partially visualized. Partially visualized chronic fracture deformity of the left mid femoral shaft. IMPRESSION: No acute  fracture or dislocation identified. If suspicion for fracture persists, consider hip MRI or hip CT if MRI is contraindicated/unavailable for further evaluation. Electronically Signed   By: Mitzi Hansen M.D.   On: 04/29/2018 17:09     IMPRESSION AND PLAN:   77 year old female with past medical history of advanced dementia, hypertension, hyperlipidemia, history of coronary artery disease, recent admission for pneumonia/UTI who presents to the hospital after a fall and noted to have UTI with some hypotension.  1.  Urinary tract infection-this is based off a urinalysis on admission. -Patient had a previous urine culture which was positive for MCL pneumonia on April 14, 2018. -Since patient was hypotensive she is being admitted and being started on IV ciprofloxacin. -Follow hemodynamics.  Patient has a normal white cell count.  2.  Hypotension- secondary to poor p.o.  intake/underlying UTI.   -Continue IV fluid hydration, hold all antihypertensives for now.  3.  Dementia- continue Exelon patch, Risperdal, Depakote  4.  Essential hypertension-hold all antihypertensives given the patient's low blood pressure.  5.  Hyperlipidemia-continue atorvastatin.  6.  History of coronary artery disease-no acute chest pain.  Continue aspirin, statin.  Hold ACE inhibitor, beta-blocker given her hypotension.   Get palliative care consult to discuss goals of care.  All the records are reviewed and case discussed with ED provider. Management plans discussed with the patient, family and they are in agreement.  CODE STATUS: Full code  TOTAL TIME TAKING CARE OF THIS PATIENT: 45 minutes.    Houston Siren M.D on 04/29/2018 at 9:06 PM  Between 7am to 6pm - Pager - [phone]  After 6pm go to [email or web address] - password [email or web address]  [facility]  Office  [phone]  CC: Primary care physician; Patient, No Pcp Per

## 2018-04-29 NOTE — ED Notes (Signed)
In and out catheter performed with chaperone of Jeannett Senior, Charity fundraiser.

## 2018-04-30 DIAGNOSIS — Z7189 Other specified counseling: Secondary | ICD-10-CM

## 2018-04-30 DIAGNOSIS — N390 Urinary tract infection, site not specified: Principal | ICD-10-CM

## 2018-04-30 DIAGNOSIS — Z515 Encounter for palliative care: Secondary | ICD-10-CM

## 2018-04-30 LAB — CBC
HEMATOCRIT: 27.7 % — AB (ref 36.0–46.0)
Hemoglobin: 8.7 g/dL — ABNORMAL LOW (ref 12.0–15.0)
MCH: 33.9 pg (ref 26.0–34.0)
MCHC: 31.4 g/dL (ref 30.0–36.0)
MCV: 107.8 fL — ABNORMAL HIGH (ref 80.0–100.0)
NRBC: 0 % (ref 0.0–0.2)
Platelets: 324 10*3/uL (ref 150–400)
RBC: 2.57 MIL/uL — AB (ref 3.87–5.11)
RDW: 13.3 % (ref 11.5–15.5)
WBC: 5.8 10*3/uL (ref 4.0–10.5)

## 2018-04-30 LAB — BASIC METABOLIC PANEL
ANION GAP: 8 (ref 5–15)
BUN: 13 mg/dL (ref 8–23)
CHLORIDE: 107 mmol/L (ref 98–111)
CO2: 24 mmol/L (ref 22–32)
Calcium: 8.3 mg/dL — ABNORMAL LOW (ref 8.9–10.3)
Creatinine, Ser: 0.88 mg/dL (ref 0.44–1.00)
GFR calc Af Amer: >60 mL/min (ref >60)
GFR calc non Af Amer: >60 mL/min (ref >60)
Glucose, Bld: 87 mg/dL (ref 70–99)
POTASSIUM: 3.6 mmol/L (ref 3.5–5.1)
SODIUM: 139 mmol/L (ref 135–145)

## 2018-04-30 MED ORDER — DICLOFENAC SODIUM 1 % TD GEL
2.0000 g | Freq: Two times a day (BID) | TRANSDERMAL | Status: DC
  Administered 2018-05-01: 2 g via TOPICAL
  Filled 2018-04-30: qty 100

## 2018-04-30 MED ORDER — RIVASTIGMINE TARTRATE 1.5 MG PO CAPS
1.5000 mg | ORAL_CAPSULE | Freq: Two times a day (BID) | ORAL | Status: DC
  Administered 2018-04-30 – 2018-05-01 (×2): 1.5 mg via ORAL
  Filled 2018-04-30 (×3): qty 1

## 2018-04-30 MED ORDER — ENSURE ENLIVE PO LIQD
237.0000 mL | Freq: Two times a day (BID) | ORAL | Status: DC
  Administered 2018-04-30 – 2018-05-01 (×2): 237 mL via ORAL

## 2018-04-30 MED ORDER — ADULT MULTIVITAMIN W/MINERALS CH
1.0000 | ORAL_TABLET | Freq: Every day | ORAL | Status: DC
  Administered 2018-04-30 – 2018-05-01 (×2): 1 via ORAL
  Filled 2018-04-30 (×2): qty 1

## 2018-04-30 MED ORDER — MORPHINE SULFATE (CONCENTRATE) 10 MG/0.5ML PO SOLN: 5.0000 mg | ORAL | Status: DC | PRN

## 2018-04-30 MED ORDER — CIPROFLOXACIN HCL 500 MG PO TABS
250.0000 mg | ORAL_TABLET | Freq: Two times a day (BID) | ORAL | Status: DC
  Administered 2018-05-01: 250 mg via ORAL
  Filled 2018-04-30: qty 1

## 2018-04-30 MED ORDER — CIPROFLOXACIN IN D5W 200 MG/100ML IV SOLN
200.0000 mg | Freq: Two times a day (BID) | INTRAVENOUS | Status: AC
  Administered 2018-04-30: 20:00:00 200 mg via INTRAVENOUS
  Filled 2018-04-30: qty 100

## 2018-04-30 NOTE — Consult Note (Signed)
Consultation Note Date: 04/30/2018   Patient Name: Tiffany Turner  DOB: [birth year]  MRN: [id number]  Age / Sex: 77 y.o., female  PCP: Patient, No Pcp Per Referring Physician: Demetrios Loll, MD  Reason for Consultation: Establishing goals of care  HPI/Patient Profile: 77 y.o. female  with past medical history of dementia, HTN, HLD, and CAD admitted on 04/29/2018 after a fall. Was found to be hypotensive and to have a UTI. Also suspicion for poor PO intake; however, family denies. PMT consulted by admitting physician for [place].  Please note that patient was evaluated by PMT 2 weeks ago. During that discussion, family was hesitant to discuss goals of care. Patient does not have living will and family was not aware of her wishes. MOST form was introduced and discussion had about code status. At that time, family elected FULL code.   Clinical Assessment and Goals of Care: I have reviewed medical records including EPIC notes, labs and imaging, received report from RN, assessed the patient and then met with patient's son, Gerald Stabs,  to discuss diagnosis prognosis, [place], EOL wishes, disposition and options.  I reintroduced Palliative Medicine as specialized medical care for people living with serious illness. It focuses on providing relief from the symptoms and stress of a serious illness. The goal is to improve quality of life for both the patient and the family.  He remembers visit from 2 weeks ago - no further questions.   Prior to my visit, family was able to speak with Dr. Bridgett Larsson and Dr. Bridgett Larsson changed code status to DNR.   Family asks about hospice philosophy and tell me this is not something they are interested in right now - continue to desire aggressive medical care. However, they are interested in continuing outpatient palliative care. Will order.    We discussed her current illness and what it means in the larger context of her on-going co-morbidities.    Questions and concerns were addressed. The family was encouraged to call with questions or concerns.   Primary Decision Maker NEXT OF KIN - son, Gerald Stabs   SUMMARY OF RECOMMENDATIONS   Rec OP palliative care - family agrees Watchful waiting - son hopeful for discharge in next day or two - considering rehab Family had recent discussion with PMT 2 weeks ago and discussed disease trajectory of dementia, MOST form, and Hard Choices booklet Family NOT interested in hospice  Code Status/Advance Care Planning:  DNR   Symptom Management:   Per primary - patient denies  Palliative Prophylaxis:   Aspiration, Bowel Regimen, Delirium Protocol, Frequent Pain Assessment, Oral Care and Turn Reposition  Additional Recommendations (Limitations, Scope, Preferences):  Full Scope Treatment  Psycho-social/Spiritual:   Desire for further Chaplaincy support:no  Additional Recommendations: Education on Hospice  Prognosis:   Unable to determine  Discharge Planning: To Be Determined      Primary Diagnoses: Present on Admission: . UTI (urinary tract infection)   I have reviewed the medical record, interviewed the patient and family, and examined the patient. The following aspects are pertinent.  Past Medical History:  Diagnosis Date  . Coronary artery disease    stents placed  . Dementia ([place])   . Hip dislocation, right (HCC)    hx of dislocation  . Hyperlipemia   . Hypertension    Social History   Socioeconomic History  . Marital status: Single    Spouse name: Not on file  . Number of children: Not on file  . Years of education: Not on file  .  Highest education level: Not on file  Occupational History  . Not on file  Social Needs  . Financial resource strain: Not on file  . Food insecurity:    Worry: Not on file    Inability: Not on file  . Transportation needs:    Medical: Not on file    Non-medical: Not on file  Tobacco Use  . Smoking status: Current Every Day  Smoker    Packs/day: 0.50    Types: Cigarettes  . Smokeless tobacco: Never Used  Substance and Sexual Activity  . Alcohol use: No  . Drug use: No  . Sexual activity: Not on file  Lifestyle  . Physical activity:    Days per week: Not on file    Minutes per session: Not on file  . Stress: Not on file  Relationships  . Social connections:    Talks on phone: Not on file    Gets together: Not on file    Attends religious service: Not on file    Active member of club or organization: Not on file    Attends meetings of clubs or organizations: Not on file    Relationship status: Not on file  Other Topics Concern  . Not on file  Social History Narrative  . Not on file   Family History  Problem Relation Age of Onset  . CAD Father    Scheduled Meds: . artificial tears  1 application Right Eye BID  . aspirin EC  81 mg Oral Daily  . atorvastatin  40 mg Oral Daily  . enoxaparin (LOVENOX) injection  40 mg Subcutaneous Q24H  . fluticasone  2 spray Each Nare Daily  . risperiDONE  1 mg Oral BID  . rivastigmine  1.5 mg Oral BID WC  . traMADol  50 mg Oral TID  . traZODone  50 mg Oral QHS  . valproic acid  250 mg Oral BID   Continuous Infusions: . sodium chloride 75 mL/hr at 04/30/18 1128  . ciprofloxacin 200 mg (04/30/18 0918)   PRN Meds:.acetaminophen **OR** acetaminophen, alum & mag hydroxide-simeth, guaiFENesin, loperamide, LORazepam, magnesium hydroxide, naphazoline-glycerin, nystatin, ondansetron **OR** ondansetron (ZOFRAN) IV, polyvinyl alcohol Allergies  Allergen Reactions  . Penicillins Other (See Comments)    Has patient had a PCN reaction causing immediate rash, facial/tongue/throat swelling, SOB or lightheadedness with hypotension: Unknown Has patient had a PCN reaction causing severe rash involving mucus membranes or skin necrosis: Unknown Has patient had a PCN reaction that required hospitalization: Unknown Has patient had a PCN reaction occurring within the last 10  years: Unknown If all of the above answers are "NO", then may proceed with Cephalosporin use.  . Sulfa Antibiotics Other (See Comments)    unknown   Review of Systems  Unable to perform ROS: Dementia    Physical Exam  Constitutional: She appears well-developed and well-nourished. No distress.  HENT:  Head: Normocephalic and atraumatic.  Cardiovascular: Normal rate and regular rhythm.  Pulmonary/Chest: Effort normal and breath sounds normal. No respiratory distress.  Abdominal: Soft. Bowel sounds are normal.  Musculoskeletal:       Right lower leg: She exhibits no edema.       Left lower leg: She exhibits no edema.  Neurological: She is alert. She is disoriented.  Skin: Skin is warm and dry.  Psychiatric: Cognition and memory are impaired.    Vital Signs: BP 128/62 (BP Location: Right Arm)   Pulse 81   Temp 98.7 F (37.1 C) (Oral)   Resp  19   Ht '5\' 2"'  (1.575 m)   Wt 61 kg   SpO2 90%   BMI 24.60 kg/m  Pain Scale: 0-10   Pain Score: 0-No pain   SpO2: SpO2: 90 % O2 Device:SpO2: 90 % O2 Flow Rate: .   IO: Intake/output summary:   Intake/Output Summary (Last 24 hours) at 04/30/2018 1320 Last data filed at 04/30/2018 0600 Gross per 24 hour  Intake 502.56 ml  Output -  Net 502.56 ml    LBM: Last BM Date: (last BM unknown) Baseline Weight: Weight: 48 kg Most recent weight: Weight: 61 kg     Palliative Assessment/Data:PPS 50%      Time Total: 40 minutes Greater than 50%  of this time was spent counseling and coordinating care related to the above assessment and plan.  Juel Burrow, DNP, AGNP-C Palliative Medicine Team [phone] Pager: [phone]

## 2018-04-30 NOTE — Progress Notes (Signed)
New referral for palliative care consult at [facility] upon discharge from [facility].  Referral received from [facility].  Discharge will likely be over the weekend.  Information faxed to referral intake.  Norris Cross RN

## 2018-04-30 NOTE — [place] FL2 (Addendum)
[place] MEDICAID FL2 LEVEL OF CARE SCREENING TOOL     IDENTIFICATION  Patient Name: Tiffany Turner Birthdate: 1941-06-26 Sex: female Admission Date (Current Location): 04/29/2018  [place] and [place] Number:  Chiropodist and Address:  [facility], [address] [place], [place] [address]      Provider Number: [id number]  Attending Physician Name and Address:  Shaune Pollack, MD  Relative Name and Phone Number:  Tanda Morrissey- son/guardian [phone]    Current Level of Care: Hospital Recommended Level of Care: Memory Care Prior Approval Number:    Date Approved/Denied:   PASRR Number:    Discharge Plan: Other (Comment)(memory care )    Current Diagnoses: Patient Active Problem List   Diagnosis Date Noted  . UTI (urinary tract infection) 04/29/2018  . Palliative care by specialist   . Goals of care, counseling/discussion   . Mild dementia (HCC)   . Altered mental status   . Acute renal failure (HCC)   . Acute cystitis 04/14/2018  . 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 Dementia, Alzheimer's, with behavior disturbance (HCC) 02/16/2015  . Hypertension 02/16/2015    Orientation RESPIRATION BLADDER Height & Weight     Self  Normal Incontinent Weight: 134 lb 8 oz (61 kg) Height:  5\' 2"  (157.5 cm)  BEHAVIORAL SYMPTOMS/MOOD NEUROLOGICAL BOWEL NUTRITION STATUS  (none) (none) Incontinent Diet(Soft diet )  AMBULATORY STATUS COMMUNICATION OF NEEDS Skin   Limited Assist Verbally Normal                       Personal Care Assistance Level of Assistance  Bathing, Feeding, Dressing Bathing Assistance: Limited assistance Feeding assistance: Independent Dressing Assistance: Limited assistance     Functional Limitations Info  Sight, Hearing, Speech Sight Info: Adequate Hearing Info: Adequate Speech Info: Adequate    SPECIAL CARE FACTORS FREQUENCY                        Contractures Contractures Info: Not present    Additional Factors Info  Code Status, Allergies Code Status Info: DNR Allergies Info: Penicillins, Sulfa Antibiotics           Current Medications (04/30/2018):  This is the current hospital active medication list Current Facility-Administered Medications  Medication Dose Route Frequency Provider Last Rate Last Dose  . 0.9 %  sodium chloride infusion   Intravenous Continuous Houston Siren, MD 75 mL/hr at 04/30/18 1345    . acetaminophen (TYLENOL) tablet 650 mg  650 mg Oral Q6H PRN Houston Siren, MD       Or  . acetaminophen (TYLENOL) suppository 650 mg  650 mg Rectal Q6H PRN Houston Siren, MD      . alum & mag hydroxide-simeth (MAALOX/MYLANTA) 200-200-20 MG/5ML suspension 30 mL  30 mL Oral Q6H PRN Houston Siren, MD      . artificial tears (LACRILUBE) ophthalmic ointment 1 application  1 application Right Eye BID Houston Siren, MD   1 application at 04/30/18 [phone]  . aspirin EC tablet 81 mg  81 mg Oral Daily Houston Siren, MD   81 mg at 04/30/18 0916  . atorvastatin (LIPITOR) tablet 40 mg  40 mg Oral Daily Houston Siren, MD   40 mg at 04/30/18 0916  . ciprofloxacin (CIPRO) IVPB 200 mg  200 mg Intravenous Q12H Houston Siren, MD   Stopped at 04/30/18 1018  .  enoxaparin (LOVENOX) injection 40 mg  40 mg Subcutaneous Q24H Houston Siren, MD   40 mg at 04/29/18 2320  . fluticasone (FLONASE) 50 MCG/ACT nasal spray 2 spray  2 spray Each Nare Daily Houston Siren, MD   2 spray at 04/30/18 0916  . guaiFENesin (ROBITUSSIN) 100 MG/5ML solution 200 mg  200 mg Oral Q6H PRN Houston Siren, MD      . loperamide (IMODIUM) capsule 2 mg  2 mg Oral PRN Houston Siren, MD      . LORazepam (ATIVAN) tablet 0.5 mg  0.5 mg Oral BID PRN Houston Siren, MD      . magnesium hydroxide (MILK OF MAGNESIA) suspension 30 mL  30 mL Oral Daily PRN Sainani, Rolly Pancake, MD      . naphazoline-glycerin (CLEAR EYES REDNESS) ophth solution 2 drop   2 drop Both Eyes QID PRN Houston Siren, MD      . nystatin (MYCOSTATIN/NYSTOP) topical powder 1 g  1 g Topical Daily PRN Houston Siren, MD      . ondansetron (ZOFRAN) tablet 4 mg  4 mg Oral Q6H PRN Houston Siren, MD       Or  . ondansetron (ZOFRAN) injection 4 mg  4 mg Intravenous Q6H PRN Sainani, Rolly Pancake, MD      . polyvinyl alcohol (LIQUIFILM TEARS) 1.4 % ophthalmic solution 1 drop  1 drop Both Eyes Daily PRN Houston Siren, MD      . risperiDONE (RISPERDAL) tablet 1 mg  1 mg Oral BID Houston Siren, MD   1 mg at 04/30/18 0916  . rivastigmine (EXELON) capsule 1.5 mg  1.5 mg Oral BID WC Shanlever, Charmayne Sheer, RPH      . traMADol Janean Sark) tablet 50 mg  50 mg Oral TID Houston Siren, MD   50 mg at 04/30/18 0916  . traZODone (DESYREL) tablet 50 mg  50 mg Oral QHS Houston Siren, MD   50 mg at 04/29/18 2319  . valproic acid (DEPAKENE) solution 250 mg  250 mg Oral BID Houston Siren, MD   250 mg at 04/30/18 1610     Discharge Medications: Medication List    STOP taking these medications   enalapril 5 MG tablet Commonly known as:  VASOTEC   levofloxacin 750 MG tablet Commonly known as:  LEVAQUIN   metoprolol succinate 25 MG 24 hr tablet Commonly known as:  TOPROL-XL     TAKE these medications   acetaminophen 500 MG tablet Commonly known as:  TYLENOL Take 500 mg by mouth every 4 (four) hours as needed for fever or headache.   aspirin 81 MG tablet Take 81 mg by mouth daily.   atorvastatin 40 MG tablet Commonly known as:  LIPITOR Take 40 mg by mouth daily.   ciprofloxacin 250 MG tablet Commonly known as:  CIPRO Take 1 tablet (250 mg total) by mouth 2 (two) times daily for 4 days.   diclofenac sodium 1 % Gel Commonly known as:  VOLTAREN Apply 2 g topically 2 (two) times daily.   fluticasone 50 MCG/ACT nasal spray Commonly known as:  FLONASE Place 2 sprays into both nostrils daily.   guaifenesin 100 MG/5ML syrup Commonly known as:   ROBITUSSIN Take 200 mg by mouth every 6 (six) hours as needed for cough.   loperamide 2 MG capsule Commonly known as:  IMODIUM Take 2 mg by mouth as needed for diarrhea or loose stools (max 8 doses daily).  LORazepam 0.5 MG tablet Commonly known as:  ATIVAN Take 0.5 mg by mouth 2 (two) times daily as needed for anxiety.   magnesium hydroxide 400 MG/5ML suspension Commonly known as:  MILK OF MAGNESIA Take 30 mLs by mouth daily as needed for mild constipation.   MINTOX REGULAR STRENGTH 200-200-20 MG/5ML suspension Generic drug:  alum & mag hydroxide-simeth Take 30 mLs by mouth every 6 (six) hours as needed for indigestion or heartburn.   morphine CONCENTRATE 10 mg / 0.5 ml concentrated solution Take 5 mg by mouth every hour as needed for severe pain, anxiety or shortness of breath.   neomycin-bacitracin-polymyxin Oint Commonly known as:  NEOSPORIN Apply 1 application topically as needed for wound care.   NITROFURANTOIN MACROCRYSTAL PO Take 100 mg by mouth daily.   nystatin powder Generic drug:  nystatin Apply 1 g topically daily as needed (skin irritation).   risperiDONE 1 MG tablet Commonly known as:  RISPERDAL Take 1 mg by mouth 2 (two) times daily.   rivastigmine 1.5 MG capsule Commonly known as:  EXELON Take 1.5 mg by mouth 2 (two) times daily.   SYSTANE 0.4-0.3 % Soln Generic drug:  Polyethyl Glycol-Propyl Glycol Place 1 drop into both eyes daily.   tetrahydrozoline 0.05 % ophthalmic solution Place 3 drops into both eyes at bedtime.   traMADol 50 MG tablet Commonly known as:  ULTRAM Take 50 mg by mouth 3 (three) times daily.   traZODone 50 MG tablet Commonly known as:  DESYREL Take 50 mg by mouth at bedtime.   Valproate Sodium 250 MG/5ML Soln solution Commonly known as:  DEPAKENE Take 250 mg by mouth 2 (two) times daily.   Wh Petrol-Mineral Oil-Lanolin 0.1-0.1 % Oint Place 0.5 inches into the right eye 2 (two) times daily.     Relevant Imaging Results:  Relevant Lab Results:   Additional Information    Candace  Rinaldo Ratel, [address]

## 2018-04-30 NOTE — Progress Notes (Signed)
[employer] [facility] [facility] [place] [facility]   PATIENT NAME: Tiffany Turner    MR#:  [id number]  DATE OF BIRTH:  07-Feb-1941  SUBJECTIVE:  CHIEF COMPLAINT:   Chief Complaint  Patient presents with  . Fall   Patient is demented, she has no complaints. REVIEW OF SYSTEMS:  Review of Systems  Unable to perform ROS: Dementia    DRUG ALLERGIES:   Allergies  Allergen Reactions  . Penicillins Other (See Comments)    Has patient had a PCN reaction causing immediate rash, facial/tongue/throat swelling, SOB or lightheadedness with hypotension: Unknown Has patient had a PCN reaction causing severe rash involving mucus membranes or skin necrosis: Unknown Has patient had a PCN reaction that required hospitalization: Unknown Has patient had a PCN reaction occurring within the last 10 years: Unknown If all of the above answers are "NO", then may proceed with Cephalosporin use.  . Sulfa Antibiotics Other (See Comments)    unknown   VITALS:  Blood pressure 128/62, pulse 81, temperature 98.7 F (37.1 C), temperature source Oral, resp. rate 19, height 5\' 2"  (1.575 m), weight 61 kg, SpO2 90 %. PHYSICAL EXAMINATION:  Physical Exam  Constitutional: No distress.  HENT:  Head: Normocephalic.  Mouth/Throat: Oropharynx is clear and moist.  Eyes: Pupils are equal, round, and reactive to light. Conjunctivae and EOM are normal. No scleral icterus.  Neck: Normal range of motion. Neck supple. No JVD present. No tracheal deviation present.  Cardiovascular: Normal rate, regular rhythm and normal heart sounds. Exam reveals no gallop.  No murmur heard. Pulmonary/Chest: Effort normal and breath sounds normal. No respiratory distress. She has no wheezes. She has no rales.  Abdominal: Soft. Bowel sounds are normal. She exhibits no distension. There is no tenderness. There is no rebound.  Musculoskeletal: Normal range of motion. She exhibits no edema or tenderness.  Neurological: She is alert.  No cranial nerve deficit.  Skin: No rash noted. No erythema.  Psychiatric:  Demented but to follow commands.   LABORATORY PANEL:  Female CBC Recent Labs  Lab 04/30/18 0537  WBC 5.8  HGB 8.7*  HCT 27.7*  PLT 324   ------------------------------------------------------------------------------------------------------------------ Chemistries  Recent Labs  Lab 04/29/18 1803 04/30/18 0537  NA 138 139  K 4.2 3.6  CL 101 107  CO2 30 24  GLUCOSE 142* 87  BUN 18 13  CREATININE 1.06* 0.88  CALCIUM 8.8* 8.3*  AST 40  --   ALT 22  --   ALKPHOS 91  --   BILITOT 0.4  --    RADIOLOGY:  Dg Shoulder Right  Result Date: 04/29/2018 CLINICAL DATA:  Acute RIGHT shoulder pain following fall today. Initial encounter. EXAM: RIGHT SHOULDER - 2+ VIEW COMPARISON:  None. FINDINGS: There is no evidence of fracture or dislocation. There is no evidence of arthropathy or other focal bone abnormality. Soft tissues are unremarkable. IMPRESSION: Negative. Electronically Signed   By: Harmon Pier M.D.   On: 04/29/2018 18:52   Ct Head Wo Contrast  Result Date: 04/29/2018 CLINICAL DATA:  77 y/o F; unwitnessed fall today. Abrasion above the left eye. EXAM: CT HEAD WITHOUT CONTRAST TECHNIQUE: Contiguous axial images were obtained from the base of the skull through the vertex without intravenous contrast. COMPARISON:  None. FINDINGS: Brain: No evidence of acute infarction, hemorrhage, hydrocephalus, extra-axial collection or mass lesion/mass effect. Encephalomalacia of the right anterior superior frontal lobe, left anterior inferior frontal lobe, as well as left anterior temporal lobe. Vascular: Calcific atherosclerosis of carotid siphons  and vertebral arteries. No hyperdense vessel identified. Skull: Postsurgical changes of the frontal bones and orbital rims, partially visualized. No acute fracture identified. Sinuses/Orbits: Normal aeration of the visible paranasal sinuses. Left mastoid opacification. Other: None.  IMPRESSION: 1. No acute intracranial abnormality or calvarial fracture identified. 2. 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 Findings may represent sequelae of infarction or traumatic brain injury. 3. Left mastoid opacification. Electronically Signed   By: Mitzi Hansen M.D.   On: 04/29/2018 16:53   Dg Shoulder Left  Result Date: 04/29/2018 CLINICAL DATA:  Acute LEFT shoulder pain following fall today. Initial encounter. EXAM: LEFT SHOULDER - 2+ VIEW COMPARISON:  None. FINDINGS: There is no evidence of fracture or dislocation. There is no evidence of arthropathy or other focal bone abnormality. Soft tissues are unremarkable. IMPRESSION: Negative. Electronically Signed   By: Harmon Pier M.D.   On: 04/29/2018 18:51   Dg Hip Unilat With Pelvis 2-3 Views Left  Result Date: 04/29/2018 CLINICAL DATA:  77 y/o  F; unwitnessed fall. Left hip pain. EXAM: DG HIP (WITH OR WITHOUT PELVIS) 2-3V LEFT COMPARISON:  None. FINDINGS: No acute fracture or dislocation identified. No pelvic fracture or diastasis. Bones are demineralized. Total right hip arthroplasty, partially visualized. Partially visualized chronic fracture deformity of the left mid femoral shaft. IMPRESSION: No acute fracture or dislocation identified. If suspicion for fracture persists, consider hip MRI or hip CT if MRI is contraindicated/unavailable for further evaluation. Electronically Signed   By: Mitzi Hansen M.D.   On: 04/29/2018 17:09   ASSESSMENT AND PLAN:   77 year old female with past medical history of advanced dementia, hypertension, hyperlipidemia, history of coronary artery disease, recent admission for pneumonia/UTI who presents to the hospital after a fall and noted to have UTI with some hypotension.  1.  Urinary tract infection-this is based off a urinalysis on admission. -Patient had a previous urine culture which was  positive for MCL pneumonia on April 14, 2018. Continue IV ciprofloxacin, follow-up urine culture.  2.  Hypotension- secondary to poor p.o. intake/underlying UTI.   Improved with IV fluid hydration, hold all antihypertensives for now.  3.  Dementia- continue Exelon patch, Risperdal, Depakote. Aspiration fall precaution.  4.  Essential hypertension-hold all antihypertensives given the patient's low blood pressure.  5.  Hyperlipidemia-continue atorvastatin.  6.  History of coronary artery disease-no acute chest pain.  Continue aspirin, statin.  Hold ACE inhibitor, beta-blocker given her hypotension.   Get palliative care consult to discuss goals of care.  Family not interested in hospice care.  All the records are reviewed and case discussed with Care Management/Social Worker. Management plans discussed with the patient's son and sister and they are in agreement.  CODE STATUS: DNR  TOTAL TIME TAKING CARE OF THIS PATIENT: 25 minutes.   More than 50% of the time was spent in counseling/coordination of care: YES  POSSIBLE D/C IN 2 DAYS, DEPENDING ON CLINICAL CONDITION.   Shaune Pollack M.D on 04/30/2018 at 2:20 PM  Between 7am to 6pm - Pager - [phone]  After 6pm go to [email or web address] - Therapist, nutritional Hospitalists

## 2018-04-30 NOTE — Care Management Note (Signed)
Case Management Note  Patient Details  Name: Tiffany Turner MRN: [id number] Date of Birth: 1940/08/30  Subjective/Objective:                 Patient presented from [facility] memory care unit after having ans unwitnessed fall.  She is being treated for UTI.  She is currently open to [facility] RN and PT   Action/Plan:  Anticipate return to [facility] with resumption of home health services with [facility]  Expected Discharge Date:                  Expected Discharge Plan:     In-House Referral:     Discharge planning Services     Post Acute Care Choice:    Choice offered to:     DME Arranged:    DME Agency:     HH Arranged:    HH Agency:     Status of Service:     If discussed at [employer], dates discussed:    Additional Comments:  Eber Hong, RN 04/30/2018, 3:11 PM

## 2018-04-30 NOTE — Progress Notes (Signed)
[facility] Care Plan.  Purpose of Encounter: CODE STATUS. Parties in Attendance: The patient, her son and sister, me. Patient's Decisional Capacity: No. Medical Story: Tiffany Turner  is a 77 y.o. female with a known history of advanced dementia, coronary artery disease, hypertension, hyperlipidemia recent admission for a pneumonia and urinary tract infection.  She is admitted for UTI, hypotension, dehydration and fall.  I discussed with the patient's son about her current condition, poor prognosis and CODE STATUS.  The son, who is POA, decided DNR for the patient.  I changed from full code to DNR and filled out the yellow DNR form. Goals of Care Determinations: Palliative care. Plan:  Code Status: DNR. Time spent discussing advance care planning: 20 minutes.

## 2018-04-30 NOTE — Discharge Instructions (Signed)
Aspiration and fall precaution. Hygiene.

## 2018-04-30 NOTE — Progress Notes (Signed)
Pt arouses easily to voice, sleeping a lot today, appetite poor, drinking well, drinking ensure well, pt with no complaints

## 2018-04-30 NOTE — Progress Notes (Signed)
Initial Nutrition Assessment  DOCUMENTATION CODES:   Not applicable  INTERVENTION:   Ensure Enlive po BID, each supplement provides 350 kcal and 20 grams of protein  MVI daily   Magic cup TID with meals, each supplement provides 290 kcal and 9 grams of protein  NUTRITION DIAGNOSIS:   Inadequate oral intake related to acute illness as evidenced by per patient/family report.  GOAL:   Patient will meet greater than or equal to 90% of their needs  MONITOR:   PO intake, Supplement acceptance, Labs, Weight trends, Skin, I & O's  REASON FOR ASSESSMENT:   Malnutrition Screening Tool    ASSESSMENT:   77 y.o. female with a known history of advanced dementia, coronary artery disease, hypertension, hyperlipidemia recent admission for a pneumonia and urinary tract infection who presents to the hospital from a skilled nursing facility after a fall.     Unable to speak with pt as pt with dementia and is a poor historian. Pt recently admitted to hospital for PNA with sepsis and was on a dysphagia diet and eating around 50% of meals prior to discharge. Per chart, pt with large weight fluctuations; unsure if patient has had any true weight loss. RD will add supplements and MVI to help pt meet her estimated needs.   Medications reviewed and include: aspirin, lovenox, tramadol, NaCl @75ml /hr, ciprofloxacin   Labs reviewed: Hgb 8.7(L), Hct 27.7(L), MCV 107.8(H)  Diet Order:   Diet Order            DIET SOFT Room service appropriate? Yes; Fluid consistency: Thin  Diet effective now             EDUCATION NEEDS:   Not appropriate for education at this time  Skin:  Skin Assessment: Reviewed RN Assessment(ecchymosis )  Last BM:  pta  Height:   Ht Readings from Last 1 Encounters:  04/29/18 5\' 2"  (1.575 m)    Weight:   Wt Readings from Last 1 Encounters:  04/29/18 61 kg    Ideal Body Weight:  50 kg  BMI:  Body mass index is 24.6 kg/m.  Estimated Nutritional Needs:    Kcal:  1200-1400kcal/day   Protein:  65-75g/day   Fluid:  >1.3L/day   Betsey Holiday MS, RD, LDN Pager #- [phone] Office#- [phone] After Hours Pager: [phone]

## 2018-04-30 NOTE — Clinical Social Work Note (Signed)
Clinical Social Work Assessment  Patient Details  Name: Tiffany Turner MRN: [id number] Date of Birth: 30-Aug-1940  Date of referral:  04/30/18               Reason for consult:  Facility Placement                Permission sought to share information with:  Case Manager, Customer service manager, Family Supports Permission granted to share information::  Yes, Verbal Permission Granted  Name::        Agency::     Relationship::     Contact Information:     Housing/Transportation Living arrangements for the past 2 months:  [name] of Information:  Guardian Patient Interpreter Needed:  None Criminal Activity/Legal Involvement Pertinent to Current Situation/Hospitalization:  No - Comment as needed Significant Relationships:  Adult Children, Siblings Lives with:  Facility Resident Do you feel safe going back to the place where you live?  Yes Need for family participation in patient care:  Yes (Comment)  Care giving concerns:  Patient lives at [facility]    Social Worker assessment / plan:  CSW consulted for facility placement. CSW met with patient and her son/guardian Tiffany Turner at bedside. CSW introduced self and explained role. Patient has Dementia and is confused. She did not say much during assessment. Patient's son states that patient lives at [facility] in the memory care unit. Per son, patient has lived there for about 2 years and plans to return when medically stable. Per son, patient already has home health PT and nursing [facility] from last admission. CSW will follow for discharge planning.   Employment status:  Retired Forensic scientist:  Medicare PT Recommendations:  Not assessed at this time Information / Referral to community resources:     Patient/Family's Response to care:  Son thanked CSW for assistance   Patient/Family's Understanding of and Emotional Response to Diagnosis, Current  Treatment, and Prognosis:  Son in agreement with discharge plan   Emotional Assessment Appearance:  Appears stated age Attitude/Demeanor/Rapport:  Lethargic Affect (typically observed):  Quiet Orientation:  Oriented to Self Alcohol / Substance use:  Not Applicable Psych involvement (Current and /or in the community):  No (Comment)  Discharge Needs  Concerns to be addressed:  Discharge Planning Concerns Readmission within the last 30 days:  Yes Current discharge risk:  None Barriers to Discharge:  Continued Medical Work up   [employer], [place] 04/30/2018, 1:44 PM

## 2018-05-01 LAB — BASIC METABOLIC PANEL
ANION GAP: 6 (ref 5–15)
BUN: 10 mg/dL (ref 8–23)
CHLORIDE: 107 mmol/L (ref 98–111)
CO2: 25 mmol/L (ref 22–32)
Calcium: 8 mg/dL — ABNORMAL LOW (ref 8.9–10.3)
Creatinine, Ser: 0.8 mg/dL (ref 0.44–1.00)
GFR calc Af Amer: >60 mL/min (ref >60)
GFR calc non Af Amer: >60 mL/min (ref >60)
GLUCOSE: 91 mg/dL (ref 70–99)
POTASSIUM: 4 mmol/L (ref 3.5–5.1)
Sodium: 138 mmol/L (ref 135–145)

## 2018-05-01 LAB — URINE CULTURE: CULTURE: NO GROWTH

## 2018-05-01 LAB — MAGNESIUM: MAGNESIUM: 1.7 mg/dL (ref 1.7–2.4)

## 2018-05-01 MED ORDER — CIPROFLOXACIN HCL 250 MG PO TABS: 250.0000 mg | ORAL_TABLET | Freq: Two times a day (BID) | ORAL | 0 refills | Status: AC

## 2018-05-01 MED ORDER — CIPROFLOXACIN HCL 250 MG PO TABS: 250.0000 mg | ORAL_TABLET | Freq: Two times a day (BID) | ORAL | 0 refills | Status: DC

## 2018-05-01 NOTE — Care Management Note (Signed)
Case Management Note  Patient Details  Name: Tiffany Turner MRN: [id number] Date of Birth: [birth year]  Subjective/Objective:  Patient to be discharged per MD order. Orders in place for home health services. Previous RNCM began workup for home health with [facility], patient still agreeable to this plan. Referral confirmed with Jermaine from [facility]. Orders for PT,RN and aide. Patient back to [facility]                   Action/Plan:   Expected Discharge Date:  05/01/18               Expected Discharge Plan:  Home w Home Health Services  In-House Referral:     Discharge planning Services  CM Consult  Post Acute Care Choice:  Resumption of Svcs/PTA Provider Choice offered to:     DME Arranged:    DME Agency:     HH Arranged:  RN, PT, Nurse's Aide HH Agency:  [facility]  Status of Service:  Completed, signed off  If discussed at Long Length of Stay Meetings, dates discussed:    Additional Comments:  Virgel Manifold, RN 05/01/2018, 12:26 PM

## 2018-05-01 NOTE — Discharge Summary (Signed)
[facility] [place] [facility]   PATIENT NAME: Tiffany Turner    MR#:  [id number]  DATE OF BIRTH:  18-Aug-1940  DATE OF ADMISSION:  04/29/2018 ADMITTING PHYSICIAN: Houston Siren, MD  DATE OF DISCHARGE: 05/01/2018   PRIMARY CARE PHYSICIAN: Patient, No Pcp Per    ADMISSION DIAGNOSIS:  Contusion of face, initial encounter [S00.83XA] Hypotension, unspecified hypotension type [I95.9] Urinary tract infection without hematuria, site unspecified [N39.0]  DISCHARGE DIAGNOSIS:  Active Problems:   UTI (urinary tract infection)   SECONDARY DIAGNOSIS:   Past Medical History:  Diagnosis Date  . Coronary artery disease    stents placed  . Dementia (HCC)   . Hip dislocation, right (HCC)    hx of dislocation  . Hyperlipemia   . Hypertension     HOSPITAL COURSE:   77 year old female with past medical history of advanced dementia, hypertension, hyperlipidemia, history of coronary artery disease, recent admission for pneumonia/UTI who presents to the hospital after a fall and noted to have UTI with some hypotension.  1. Urinary tract infection-this is based off a urinalysis on admission. -Patient had a previous urine culture which was positive for MCL pneumonia on April 14, 2018. Continue IV ciprofloxacin, follow-up urine culture.  No growth so far.  2. Hypotension- secondary to poor p.o. intake/underlying UTI.  Improved with IV fluid hydration, hold all antihypertensives for now. Blood pressure is stable, advised to stop antihypertensive medications on discharge in home visiting nurse should check the blood pressure and discussed with primary care physician if needed to restart some of the meds.  3. Dementia- continue Exelon patch, Risperdal, Depakote. Aspiration fall precaution.  4. Essential hypertension-hold all antihypertensives given the patient's low blood pressure. As above, stop blood pressure meds.  Reevaluate by home visiting  nurse.  5. Hyperlipidemia-continue atorvastatin.  6. History of coronary artery disease-no acute chest pain. Continue aspirin, statin. Hold ACE inhibitor, beta-blocker given her hypotension.  DISCHARGE CONDITIONS:  Stable.   CONSULTS OBTAINED:    DRUG ALLERGIES:   Allergies  Allergen Reactions  . Penicillins Other (See Comments)    Has patient had a PCN reaction causing immediate rash, facial/tongue/throat swelling, SOB or lightheadedness with hypotension: Unknown Has patient had a PCN reaction causing severe rash involving mucus membranes or skin necrosis: Unknown Has patient had a PCN reaction that required hospitalization: Unknown Has patient had a PCN reaction occurring within the last 10 years: Unknown If all of the above answers are "NO", then may proceed with Cephalosporin use.  . Sulfa Antibiotics Other (See Comments)    unknown    DISCHARGE MEDICATIONS:   Allergies as of 05/01/2018      Reactions   Penicillins Other (See Comments)   Has patient had a PCN reaction causing immediate rash, facial/tongue/throat swelling, SOB or lightheadedness with hypotension: Unknown Has patient had a PCN reaction causing severe rash involving mucus membranes or skin necrosis: Unknown Has patient had a PCN reaction that required hospitalization: Unknown Has patient had a PCN reaction occurring within the last 10 years: Unknown If all of the above answers are "NO", then may proceed with Cephalosporin use.   Sulfa Antibiotics Other (See Comments)   unknown      Medication List    STOP taking these medications   enalapril 5 MG tablet Commonly known as:  VASOTEC   levofloxacin 750 MG tablet Commonly known as:  LEVAQUIN   metoprolol succinate 25 MG 24 hr tablet Commonly known as:  TOPROL-XL  TAKE these medications   acetaminophen 500 MG tablet Commonly known as:  TYLENOL Take 500 mg by mouth every 4 (four) hours as needed for fever or headache.   aspirin 81 MG  tablet Take 81 mg by mouth daily.   atorvastatin 40 MG tablet Commonly known as:  LIPITOR Take 40 mg by mouth daily.   ciprofloxacin 250 MG tablet Commonly known as:  CIPRO Take 1 tablet (250 mg total) by mouth 2 (two) times daily for 4 days.   diclofenac sodium 1 % Gel Commonly known as:  VOLTAREN Apply 2 g topically 2 (two) times daily.   fluticasone 50 MCG/ACT nasal spray Commonly known as:  FLONASE Place 2 sprays into both nostrils daily.   guaifenesin 100 MG/5ML syrup Commonly known as:  ROBITUSSIN Take 200 mg by mouth every 6 (six) hours as needed for cough.   loperamide 2 MG capsule Commonly known as:  IMODIUM Take 2 mg by mouth as needed for diarrhea or loose stools (max 8 doses daily).   LORazepam 0.5 MG tablet Commonly known as:  ATIVAN Take 0.5 mg by mouth 2 (two) times daily as needed for anxiety.   magnesium hydroxide 400 MG/5ML suspension Commonly known as:  MILK OF MAGNESIA Take 30 mLs by mouth daily as needed for mild constipation.   MINTOX REGULAR STRENGTH 200-200-20 MG/5ML suspension Generic drug:  alum & mag hydroxide-simeth Take 30 mLs by mouth every 6 (six) hours as needed for indigestion or heartburn.   morphine CONCENTRATE 10 mg / 0.5 ml concentrated solution Take 5 mg by mouth every hour as needed for severe pain, anxiety or shortness of breath.   neomycin-bacitracin-polymyxin Oint Commonly known as:  NEOSPORIN Apply 1 application topically as needed for wound care.   NITROFURANTOIN MACROCRYSTAL PO Take 100 mg by mouth daily.   nystatin powder Generic drug:  nystatin Apply 1 g topically daily as needed (skin irritation).   risperiDONE 1 MG tablet Commonly known as:  RISPERDAL Take 1 mg by mouth 2 (two) times daily.   rivastigmine 1.5 MG capsule Commonly known as:  EXELON Take 1.5 mg by mouth 2 (two) times daily.   SYSTANE 0.4-0.3 % Soln Generic drug:  Polyethyl Glycol-Propyl Glycol Place 1 drop into both eyes daily.    tetrahydrozoline 0.05 % ophthalmic solution Place 3 drops into both eyes at bedtime.   traMADol 50 MG tablet Commonly known as:  ULTRAM Take 50 mg by mouth 3 (three) times daily.   traZODone 50 MG tablet Commonly known as:  DESYREL Take 50 mg by mouth at bedtime.   Valproate Sodium 250 MG/5ML Soln solution Commonly known as:  DEPAKENE Take 250 mg by mouth 2 (two) times daily.   Wh Petrol-Mineral Oil-Lanolin 0.1-0.1 % Oint Place 0.5 inches into the right eye 2 (two) times daily.        DISCHARGE INSTRUCTIONS:    Follow with PMD in 1 to 2 weeks.  If you experience worsening of your admission symptoms, develop shortness of breath, life threatening emergency, suicidal or homicidal thoughts you must seek medical attention immediately by calling 911 or calling your MD immediately  if symptoms less severe.  You Must read complete instructions/literature along with all the possible adverse reactions/side effects for all the Medicines you take and that have been prescribed to you. Take any new Medicines after you have completely understood and accept all the possible adverse reactions/side effects.   Please note  You were cared for by a hospitalist during your hospital  stay. If you have any questions about your discharge medications or the care you received while you were in the hospital after you are discharged, you can call the unit and asked to speak with the hospitalist on call if the hospitalist that took care of you is not available. Once you are discharged, your primary care physician will handle any further medical issues. Please note that NO REFILLS for any discharge medications will be authorized once you are discharged, as it is imperative that you return to your primary care physician (or establish a relationship with a primary care physician if you do not have one) for your aftercare needs so that they can reassess your need for medications and monitor your lab  values.    Today   CHIEF COMPLAINT:   Chief Complaint  Patient presents with  . Fall    HISTORY OF PRESENT ILLNESS:  Christmas Faraci  is a 77 y.o. female with a known history of advanced dementia, coronary artery disease, hypertension, hyperlipidemia recent admission for a pneumonia and urinary tract infection who presents to the hospital from a skilled nursing facility after a fall.  The patient is a very poor historian and there is no family at bedside therefore most of the history obtained from the ER physician and from the chart.  Patient apparently had a witnessed fall at the skilled nursing/assisted living and therefore presents to the hospital for further evaluation.  Pt. was noted to underwent a CT of the head which was negative for acute pathology, her x-rays of her shoulders and hip are also negative for any acute fracture.  Patient's urinalysis was positive for UTI and she was also noted to be hypotensive with systolic blood pressures in the 80s and therefore hospitalist services were contacted for admission.   VITAL SIGNS:  Blood pressure (!) 129/54, pulse 79, temperature 98.3 F (36.8 C), temperature source Oral, resp. rate 17, height 5\' 2"  (1.575 m), weight 61 kg, SpO2 92 %.  I/O:    Intake/Output Summary (Last 24 hours) at 05/01/2018 1109 Last data filed at 05/01/2018 1019 Gross per 24 hour  Intake 1855.23 ml  Output 1500 ml  Net 355.23 ml    PHYSICAL EXAMINATION:   Constitutional: No distress.  HENT:  Head: Normocephalic.  Mouth/Throat: Oropharynx is clear and moist.  Eyes: Pupils are equal, round, and reactive to light. Conjunctivae and EOM are normal. No scleral icterus.  Neck: Normal range of motion. Neck supple. No JVD present. No tracheal deviation present.  Cardiovascular: Normal rate, regular rhythm and normal heart sounds. Exam reveals no gallop.  No murmur heard. Pulmonary/Chest: Effort normal and breath sounds normal. No respiratory distress. She  has no wheezes. She has no rales.  Abdominal: Soft. Bowel sounds are normal. She exhibits no distension. There is no tenderness. There is no rebound.  Musculoskeletal: Normal range of motion. She exhibits no edema or tenderness.  Neurological: She is alert. No cranial nerve deficit.  Skin: No rash noted. No erythema.  Psychiatric:  Demented but to follow commands.   DATA REVIEW:   CBC Recent Labs  Lab 04/30/18 0537  WBC 5.8  HGB 8.7*  HCT 27.7*  PLT 324    Chemistries  Recent Labs  Lab 04/29/18 1803  05/01/18 0425  NA 138   < > 138  K 4.2   < > 4.0  CL 101   < > 107  CO2 30   < > 25  GLUCOSE 142*   < > 91  BUN 18   < > 10  CREATININE 1.06*   < > 0.80  CALCIUM 8.8*   < > 8.0*  MG  --   --  1.7  AST 40  --   --   ALT 22  --   --   ALKPHOS 91  --   --   BILITOT 0.4  --   --    < > = values in this interval not displayed.    Cardiac Enzymes Recent Labs  Lab 04/29/18 1803  TROPONINI <0.03    Microbiology Results  Results for orders placed or performed during the hospital encounter of 04/29/18  Urine culture     Status: None   Collection Time: 04/29/18  7:23 PM  Result Value Ref Range Status   Specimen Description   Final    URINE, RANDOM Performed at [facility], [address] [place], [place] [address]    Special Requests   Final    NONE Performed at [facility], [address], [place], [place] [address]    Culture   Final    NO GROWTH Performed at [facility], [address], [place], [place] [address]    Report Status 05/01/2018 FINAL  Final  Culture, blood (routine x 2)     Status: None (Preliminary result)   Collection Time: 04/29/18  8:41 PM  Result Value Ref Range Status   Specimen Description BLOOD BLOOD LEFT HAND  Final   Special Requests   Final    BOTTLES DRAWN AEROBIC AND ANAEROBIC Blood Culture adequate volume   Culture   Final    NO GROWTH 2 DAYS Performed at [facility], [address] [place], [place] [address]    Report Status PENDING  Incomplete  Culture, blood (routine x 2)     Status: None (Preliminary result)   Collection Time: 04/29/18  8:41 PM  Result Value Ref Range Status   Specimen Description BLOOD RIGHT ANTECUBITAL  Final   Special Requests   Final    BOTTLES DRAWN AEROBIC AND ANAEROBIC Blood Culture results may not be optimal due to an inadequate volume of blood received in culture bottles   Culture   Final    NO GROWTH 2 DAYS Performed at [facility], [address] [place], [place] [address]    Report Status PENDING  Incomplete    RADIOLOGY:  Dg Shoulder Right  Result Date: 04/29/2018 CLINICAL DATA:  Acute RIGHT shoulder pain following fall today. Initial encounter. EXAM: RIGHT SHOULDER - 2+ VIEW COMPARISON:  None. FINDINGS: There is no evidence of fracture or dislocation. There is no evidence of arthropathy or other focal bone abnormality. Soft tissues are unremarkable. IMPRESSION: Negative. Electronically Signed   By: Harmon Pier M.D.   On: 04/29/2018 18:52   Ct Head Wo Contrast  Result Date: 04/29/2018 CLINICAL DATA:  77 y/o F; unwitnessed fall today. Abrasion above the left eye. EXAM: CT HEAD WITHOUT CONTRAST TECHNIQUE: Contiguous axial images were obtained from the base of the skull through the vertex without intravenous contrast. COMPARISON:  None. FINDINGS: Brain: No evidence of acute infarction, hemorrhage, hydrocephalus, extra-axial collection or mass lesion/mass effect. Encephalomalacia of the right anterior superior frontal lobe, left anterior inferior frontal lobe, as well as left anterior temporal lobe. Vascular: Calcific atherosclerosis of carotid siphons and vertebral arteries. No hyperdense vessel identified. Skull: Postsurgical changes of the frontal bones and orbital rims, partially visualized. No acute fracture identified. Sinuses/Orbits: Normal aeration  of the visible paranasal sinuses. Left mastoid opacification. Other: None.  IMPRESSION: 1. No acute intracranial abnormality or calvarial fracture identified. 2. 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 Findings may represent sequelae of infarction or traumatic brain injury. 3. Left mastoid opacification. Electronically Signed   By: Mitzi Hansen M.D.   On: 04/29/2018 16:53   Dg Shoulder Left  Result Date: 04/29/2018 CLINICAL DATA:  Acute LEFT shoulder pain following fall today. Initial encounter. EXAM: LEFT SHOULDER - 2+ VIEW COMPARISON:  None. FINDINGS: There is no evidence of fracture or dislocation. There is no evidence of arthropathy or other focal bone abnormality. Soft tissues are unremarkable. IMPRESSION: Negative. Electronically Signed   By: Harmon Pier M.D.   On: 04/29/2018 18:51   Dg Hip Unilat With Pelvis 2-3 Views Left  Result Date: 04/29/2018 CLINICAL DATA:  77 y/o  F; unwitnessed fall. Left hip pain. EXAM: DG HIP (WITH OR WITHOUT PELVIS) 2-3V LEFT COMPARISON:  None. FINDINGS: No acute fracture or dislocation identified. No pelvic fracture or diastasis. Bones are demineralized. Total right hip arthroplasty, partially visualized. Partially visualized chronic fracture deformity of the left mid femoral shaft. IMPRESSION: No acute fracture or dislocation identified. If suspicion for fracture persists, consider hip MRI or hip CT if MRI is contraindicated/unavailable for further evaluation. Electronically Signed   By: Mitzi Hansen M.D.   On: 04/29/2018 17:09    EKG:   Orders placed or performed during the hospital encounter of 04/29/18  . EKG 12-Lead  . EKG 12-Lead  . ED EKG  . ED EKG      Management plans discussed with the patient, family and they are in agreement.  CODE STATUS: DNR    Code Status Orders  (From admission, onward)         Start     Ordered   04/30/18 1251  Do not attempt resuscitation (DNR)  Continuous    Question Answer  Comment  In the event of cardiac or respiratory ARREST Do not call a "code blue"   In the event of cardiac or respiratory ARREST Do not perform Intubation, CPR, defibrillation or ACLS   In the event of cardiac or respiratory ARREST Use medication by any route, position, wound care, and other measures to relive pain and suffering. May use oxygen, suction and manual treatment of airway obstruction as needed for comfort.      04/30/18 1250        Code Status History    Date Active Date Inactive Code Status Order ID Comments User Context   04/29/2018 2210 04/30/2018 1250 Full Code [id number]  Houston Siren, MD Inpatient   04/14/2018 1511 04/18/2018 1759 Full Code [id number]  Alford Highland, MD ED      TOTAL TIME TAKING CARE OF THIS PATIENT: 35 minutes.  Discussed with patient's son on the phone.  Altamese Dilling M.D on 05/01/2018 at 11:09 AM  Between 7am to 6pm - Pager - [phone]  After 6pm go to [email or web address] - password [email or web address]  [facility]  Office  [phone]  CC: Primary care physician; Patient, No Pcp Per   Note: This dictation was prepared with Dragon dictation along with smaller phrase technology. Any transcriptional errors that result from this process are unintentional.

## 2018-05-01 NOTE — Clinical Social Work Note (Signed)
The patient will discharge today to [facility] via non-emergent EMS. The patient's facility Victorino Dike, med tech) and her legal guardian (son, [name]) are aware and in agreement. The CSW will deliver the discharge packet once a hard script for the patient's new medication is available. After delivery of the packet, the CSW will sign off. Please consult should additional needs arise.  Argentina Ponder, MSW, Theresia Majors [phone]

## 2018-05-04 LAB — CULTURE, BLOOD (ROUTINE X 2)
CULTURE: NO GROWTH
Culture: NO GROWTH
Special Requests: ADEQUATE

## 2019-05-02 ENCOUNTER — Encounter: Payer: Self-pay | Admitting: Emergency Medicine

## 2019-05-02 ENCOUNTER — Other Ambulatory Visit: Payer: Self-pay

## 2019-05-02 ENCOUNTER — Inpatient Hospital Stay
Admission: EM | Admit: 2019-05-02 | Discharge: 2019-05-03 | DRG: 871 | Disposition: A | Discharge status: Other Acute Inpatient Hospital | Payer: Medicare Other | Source: Skilled Nursing Facility | Attending: Internal Medicine | Admitting: Internal Medicine

## 2019-05-02 ENCOUNTER — Emergency Department: Payer: Medicare Other

## 2019-05-02 DIAGNOSIS — F0281 Dementia in other diseases classified elsewhere with behavioral disturbance: Secondary | ICD-10-CM | POA: Diagnosis present

## 2019-05-02 DIAGNOSIS — U071 COVID-19: Secondary | ICD-10-CM | POA: Diagnosis present

## 2019-05-02 DIAGNOSIS — Z7982 Long term (current) use of aspirin: Secondary | ICD-10-CM | POA: Diagnosis not present

## 2019-05-02 DIAGNOSIS — E860 Dehydration: Secondary | ICD-10-CM | POA: Diagnosis present

## 2019-05-02 DIAGNOSIS — Z955 Presence of coronary angioplasty implant and graft: Secondary | ICD-10-CM

## 2019-05-02 DIAGNOSIS — I10 Essential (primary) hypertension: Secondary | ICD-10-CM | POA: Diagnosis present

## 2019-05-02 DIAGNOSIS — N3000 Acute cystitis without hematuria: Secondary | ICD-10-CM

## 2019-05-02 DIAGNOSIS — H109 Unspecified conjunctivitis: Secondary | ICD-10-CM | POA: Diagnosis present

## 2019-05-02 DIAGNOSIS — A4189 Other specified sepsis: Secondary | ICD-10-CM | POA: Diagnosis present

## 2019-05-02 DIAGNOSIS — G300 Alzheimer's disease with early onset: Secondary | ICD-10-CM | POA: Diagnosis present

## 2019-05-02 DIAGNOSIS — Z96641 Presence of right artificial hip joint: Secondary | ICD-10-CM | POA: Diagnosis present

## 2019-05-02 DIAGNOSIS — N390 Urinary tract infection, site not specified: Secondary | ICD-10-CM | POA: Diagnosis present

## 2019-05-02 DIAGNOSIS — Z7951 Long term (current) use of inhaled steroids: Secondary | ICD-10-CM

## 2019-05-02 DIAGNOSIS — Z791 Long term (current) use of non-steroidal anti-inflammatories (NSAID): Secondary | ICD-10-CM

## 2019-05-02 DIAGNOSIS — Z882 Allergy status to sulfonamides status: Secondary | ICD-10-CM

## 2019-05-02 DIAGNOSIS — G9341 Metabolic encephalopathy: Secondary | ICD-10-CM | POA: Diagnosis present

## 2019-05-02 DIAGNOSIS — Z79899 Other long term (current) drug therapy: Secondary | ICD-10-CM

## 2019-05-02 DIAGNOSIS — I2510 Atherosclerotic heart disease of native coronary artery without angina pectoris: Secondary | ICD-10-CM | POA: Diagnosis present

## 2019-05-02 DIAGNOSIS — J1289 Other viral pneumonia: Secondary | ICD-10-CM | POA: Diagnosis present

## 2019-05-02 DIAGNOSIS — F17210 Nicotine dependence, cigarettes, uncomplicated: Secondary | ICD-10-CM | POA: Diagnosis present

## 2019-05-02 DIAGNOSIS — E870 Hyperosmolality and hypernatremia: Secondary | ICD-10-CM | POA: Diagnosis present

## 2019-05-02 DIAGNOSIS — Z8249 Family history of ischemic heart disease and other diseases of the circulatory system: Secondary | ICD-10-CM

## 2019-05-02 DIAGNOSIS — Z7952 Long term (current) use of systemic steroids: Secondary | ICD-10-CM

## 2019-05-02 DIAGNOSIS — R4182 Altered mental status, unspecified: Secondary | ICD-10-CM | POA: Diagnosis present

## 2019-05-02 DIAGNOSIS — E785 Hyperlipidemia, unspecified: Secondary | ICD-10-CM | POA: Diagnosis present

## 2019-05-02 DIAGNOSIS — Z751 Person awaiting admission to adequate facility elsewhere: Secondary | ICD-10-CM | POA: Diagnosis not present

## 2019-05-02 DIAGNOSIS — Z66 Do not resuscitate: Secondary | ICD-10-CM | POA: Diagnosis present

## 2019-05-02 DIAGNOSIS — N179 Acute kidney failure, unspecified: Secondary | ICD-10-CM | POA: Diagnosis present

## 2019-05-02 DIAGNOSIS — Z87440 Personal history of urinary (tract) infections: Secondary | ICD-10-CM

## 2019-05-02 DIAGNOSIS — Z880 Allergy status to penicillin: Secondary | ICD-10-CM

## 2019-05-02 LAB — COMPREHENSIVE METABOLIC PANEL
ALT: 73 U/L — ABNORMAL HIGH (ref 0–44)
AST: 113 U/L — ABNORMAL HIGH (ref 15–41)
Albumin: 2.9 g/dL — ABNORMAL LOW (ref 3.5–5.0)
Alkaline Phosphatase: 96 U/L (ref 38–126)
BUN: 171 mg/dL — ABNORMAL HIGH (ref 8–23)
CO2: 18 mmol/L — ABNORMAL LOW (ref 22–32)
Calcium: 9.2 mg/dL (ref 8.9–10.3)
Chloride: >130 mmol/L (ref 98–111)
Creatinine, Ser: 5.35 mg/dL — ABNORMAL HIGH (ref 0.44–1.00)
GFR calc Af Amer: 8 mL/min — ABNORMAL LOW (ref >60)
GFR calc non Af Amer: 7 mL/min — ABNORMAL LOW (ref >60)
Glucose, Bld: 187 mg/dL — ABNORMAL HIGH (ref 70–99)
Potassium: 4.9 mmol/L (ref 3.5–5.1)
Sodium: 168 mmol/L (ref 135–145)
Total Bilirubin: 0.8 mg/dL (ref 0.3–1.2)
Total Protein: 9.1 g/dL — ABNORMAL HIGH (ref 6.5–8.1)

## 2019-05-02 LAB — CBC WITH DIFFERENTIAL/PLATELET
Abs Immature Granulocytes: 0.33 10*3/uL — ABNORMAL HIGH (ref 0.00–0.07)
Basophils Absolute: 0.1 10*3/uL (ref 0.0–0.1)
Basophils Relative: 0 %
Eosinophils Absolute: 0 10*3/uL (ref 0.0–0.5)
Eosinophils Relative: 0 %
HCT: 42.3 % (ref 36.0–46.0)
Hemoglobin: 13 g/dL (ref 12.0–15.0)
Immature Granulocytes: 1 %
Lymphocytes Relative: 3 %
Lymphs Abs: 1 10*3/uL (ref 0.7–4.0)
MCH: 33.1 pg (ref 26.0–34.0)
MCHC: 30.7 g/dL (ref 30.0–36.0)
MCV: 107.6 fL — ABNORMAL HIGH (ref 80.0–100.0)
Monocytes Absolute: 0.9 10*3/uL (ref 0.1–1.0)
Monocytes Relative: 3 %
Neutro Abs: 28 10*3/uL — ABNORMAL HIGH (ref 1.7–7.7)
Neutrophils Relative %: 93 %
Platelets: 479 10*3/uL — ABNORMAL HIGH (ref 150–400)
RBC: 3.93 MIL/uL (ref 3.87–5.11)
RDW: 14.2 % (ref 11.5–15.5)
Smear Review: NORMAL
WBC: 30.3 10*3/uL — ABNORMAL HIGH (ref 4.0–10.5)
nRBC: 0.4 % — ABNORMAL HIGH (ref 0.0–0.2)

## 2019-05-02 LAB — URINALYSIS, COMPLETE (UACMP) WITH MICROSCOPIC
Bilirubin Urine: NEGATIVE
Glucose, UA: NEGATIVE mg/dL
Ketones, ur: NEGATIVE mg/dL
Nitrite: NEGATIVE
Protein, ur: >=300 mg/dL — AB
RBC / HPF: >50 RBC/hpf — ABNORMAL HIGH (ref 0–5)
Specific Gravity, Urine: 1.02 (ref 1.005–1.030)
WBC, UA: >50 WBC/hpf — ABNORMAL HIGH (ref 0–5)
pH: 7 (ref 5.0–8.0)

## 2019-05-02 LAB — BLOOD GAS, VENOUS
Patient temperature: 37
pCO2, Ven: 33 mmHg — ABNORMAL LOW (ref 44.0–60.0)
pH, Ven: 7.37 (ref 7.250–7.430)
pO2, Ven: 71 mmHg — ABNORMAL HIGH (ref 32.0–45.0)

## 2019-05-02 LAB — PROCALCITONIN: Procalcitonin: 0.91 ng/mL

## 2019-05-02 LAB — LACTIC ACID, PLASMA
Lactic Acid, Venous: 2.7 mmol/L (ref 0.5–1.9)
Lactic Acid, Venous: 1.4 mmol/L (ref 0.5–1.9)

## 2019-05-02 LAB — C-REACTIVE PROTEIN: CRP: 5.7 mg/dL — ABNORMAL HIGH (ref <1.0)

## 2019-05-02 LAB — FIBRIN DERIVATIVES D-DIMER (ARMC ONLY): Fibrin derivatives D-dimer (ARMC): 4319.65 ng/mL (FEU) — ABNORMAL HIGH (ref 0.00–499.00)

## 2019-05-02 MED ORDER — DEXAMETHASONE SODIUM PHOSPHATE 10 MG/ML IJ SOLN
10.0000 mg | Freq: Once | INTRAMUSCULAR | Status: AC
  Administered 2019-05-02: 10 mg via INTRAVENOUS
  Filled 2019-05-02: qty 1

## 2019-05-02 MED ORDER — AZTREONAM 1 G IJ SOLR
500.0000 mg | Freq: Three times a day (TID) | INTRAMUSCULAR | Status: DC
  Filled 2019-05-02: qty 0.5

## 2019-05-02 MED ORDER — VALPROATE SODIUM 500 MG/5ML IV SOLN
250.0000 mg | Freq: Two times a day (BID) | INTRAVENOUS | Status: DC
  Filled 2019-05-02 (×2): qty 2.5

## 2019-05-02 MED ORDER — SODIUM CHLORIDE 0.9 % IV SOLN
2.0000 g | Freq: Once | INTRAVENOUS | Status: AC
  Administered 2019-05-02: 2 g via INTRAVENOUS
  Filled 2019-05-02: qty 2

## 2019-05-02 MED ORDER — VANCOMYCIN VARIABLE DOSE PER UNSTABLE RENAL FUNCTION (PHARMACIST DOSING): Status: DC

## 2019-05-02 MED ORDER — LACTATED RINGERS IV BOLUS: 1000.0000 mL | Freq: Once | INTRAVENOUS | Status: DC

## 2019-05-02 MED ORDER — LACTATED RINGERS IV BOLUS
1000.0000 mL | Freq: Once | INTRAVENOUS | Status: AC
  Administered 2019-05-02: 1000 mL via INTRAVENOUS

## 2019-05-02 MED ORDER — DEXTROSE 5 % IV SOLN
0.5000 g | Freq: Three times a day (TID) | INTRAVENOUS | Status: DC
  Filled 2019-05-02 (×2): qty 0.5

## 2019-05-02 MED ORDER — VANCOMYCIN HCL IN DEXTROSE 1-5 GM/200ML-% IV SOLN
1000.0000 mg | Freq: Once | INTRAVENOUS | Status: AC
  Administered 2019-05-02: 1000 mg via INTRAVENOUS
  Filled 2019-05-02: qty 200

## 2019-05-02 MED ORDER — HEPARIN SODIUM (PORCINE) 5000 UNIT/ML IJ SOLN: 5000.0000 [IU] | Freq: Three times a day (TID) | INTRAMUSCULAR | Status: DC

## 2019-05-02 MED ORDER — SODIUM CHLORIDE 0.9 % IV BOLUS
500.0000 mL | Freq: Once | INTRAVENOUS | Status: AC
  Administered 2019-05-02: 500 mL via INTRAVENOUS

## 2019-05-02 MED ORDER — RIVASTIGMINE TARTRATE 1.5 MG PO CAPS
1.5000 mg | ORAL_CAPSULE | Freq: Two times a day (BID) | ORAL | Status: DC
  Filled 2019-05-02 (×2): qty 1

## 2019-05-02 MED ORDER — LACTATED RINGERS IV SOLN
INTRAVENOUS | Status: DC
  Administered 2019-05-02: 15:00:00 via INTRAVENOUS

## 2019-05-02 NOTE — ED Notes (Addendum)
Verbal consent received from [name], the patients legal guardian.

## 2019-05-02 NOTE — ED Provider Notes (Addendum)
[facility] Emergency Department Provider Note  ____________________________________________   First MD Initiated Contact with Patient 05/02/19 [phone]     (approximate)  I have reviewed the triage vital signs and the nursing notes.   HISTORY  Chief Complaint Altered Mental Status    HPI AMIYRAH Turner is a 78 y.o. female with past medical history of dementia, hypertension, hyperlipidemia, here with altered mental status.  Patient normally is occasionally verbal and responsive to painful stimuli, but has been less responsive over the last 24 hours.  Per report, she was diagnosed with Covid and is at [facility] which currently has an outbreak.  Per EMS report, patient was found hypoxic and tachycardic to the 140s.  They were unable to provide additional history as to when her last normal was.  Patient arrives with MOST form and DNR in place.  Level 5 caveat invoked as remainder of history, ROS, and physical exam limited due to patient's dementia.         Past Medical History:  Diagnosis Date  . Coronary artery disease    stents placed  . Dementia (HCC)   . Hip dislocation, right (HCC)    hx of dislocation  . Hyperlipemia   . Hypertension     Patient Active Problem List   Diagnosis Date Noted  . UTI (urinary tract infection) 04/29/2018  . Palliative care by specialist   . Goals of care, counseling/discussion   . Mild dementia (HCC)   . Altered mental status   . Acute renal failure (HCC)   . Acute cystitis 04/14/2018  . 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 Dementia, Alzheimer's, with behavior disturbance (HCC) 02/16/2015  . Hypertension 02/16/2015    Past Surgical History:  Procedure Laterality Date  . CARDIAC SURGERY     stents  . CORONARY ANGIOPLASTY WITH STENT PLACEMENT    . TOTAL HIP ARTHROPLASTY Right     Prior to Admission medications   Medication Sig Start Date End Date  Taking? Authorizing Provider  acetaminophen (TYLENOL) 500 MG tablet Take 500 mg by mouth every 4 (four) hours as needed for fever or headache.   Yes [provider]  alum & mag hydroxide-simeth (MINTOX REGULAR STRENGTH) 200-200-20 MG/5ML suspension Take 30 mLs by mouth every 6 (six) hours as needed for indigestion or heartburn.   Yes [provider]  Amino Acids-Protein Hydrolys (FEEDING SUPPLEMENT, PRO-STAT SUGAR FREE 64,) LIQD Take 30 mLs by mouth daily.   Yes [provider]  ammonium lactate (LAC-HYDRIN) 12 % lotion Apply 1 application topically 2 (two) times daily. To bilateral feet   Yes [provider]  aspirin 81 MG tablet Take 81 mg by mouth daily.    Yes [provider]  atorvastatin (LIPITOR) 40 MG tablet Take 40 mg by mouth daily.   Yes [provider]  cholecalciferol (VITAMIN D3) 25 MCG (1000 UT) tablet Take 1,000 Units by mouth daily.   Yes [provider]  dexamethasone (DECADRON) 6 MG tablet Take 6 mg by mouth daily.   Yes [provider]  diclofenac sodium (VOLTAREN) 1 % GEL Apply 2 g topically 2 (two) times daily as needed (to wrist).    Yes [provider]  Ensure (ENSURE) Take 237 mLs by mouth 2 (two) times daily.   Yes [provider]  erythromycin ophthalmic ointment 1 application at bedtime.   Yes [provider]  fluticasone (FLONASE) 50 MCG/ACT nasal spray  Place 2 sprays into both nostrils daily.    Yes [provider]  Glycerol Monooleate POWD Take 500 mg by mouth daily.   Yes [provider]  hydrocortisone (ANUSOL-HC) 25 MG suppository Place 25 mg rectally 2 (two) times daily as needed for hemorrhoids or anal itching.   Yes [provider]  magnesium hydroxide (MILK OF MAGNESIA) 400 MG/5ML suspension Take 30 mLs by mouth daily as needed for mild constipation.   Yes [provider]  Menthol-Zinc Oxide 0.45-20 % OINT Apply topically 3  (three) times daily as needed (to perineal area).   Yes [provider]  metoprolol succinate (TOPROL-XL) 25 MG 24 hr tablet Take 25 mg by mouth daily.   Yes [provider]  Multiple Vitamin (MULTIVITAMIN WITH MINERALS) TABS tablet Take 1 tablet by mouth daily.   Yes [provider]  Polyethyl Glycol-Propyl Glycol (SYSTANE) 0.4-0.3 % SOLN Place 1 drop into both eyes 2 (two) times daily.    Yes [provider]  risperiDONE (RISPERDAL) 1 MG tablet Take 1 mg by mouth 2 (two) times daily.   Yes [provider]  rivastigmine (EXELON) 1.5 MG capsule Take 1.5 mg by mouth 2 (two) times daily.   Yes [provider]  senna-docusate (SENOKOT-S) 8.6-50 MG tablet Take 1 tablet by mouth 2 (two) times daily.   Yes [provider]  tetrahydrozoline 0.05 % ophthalmic solution Place 3 drops into both eyes at bedtime.   Yes [provider]  traZODone (DESYREL) 50 MG tablet Take 50 mg by mouth at bedtime.   Yes [provider]  valproic acid (DEPAKENE) 250 MG capsule Take 250 mg by mouth 2 (two) times daily.   Yes [provider]  zinc sulfate 220 (50 Zn) MG capsule Take 220 mg by mouth daily.   Yes [provider]    Allergies Penicillins and Sulfa antibiotics  Family History  Problem Relation Age of Onset  . CAD Father     Social History Social History   Tobacco Use  . Smoking status: Current Every Day Smoker    Packs/day: 0.50    Types: Cigarettes  . Smokeless tobacco: Never Used  Substance Use Topics  . Alcohol use: No  . Drug use: No    Review of Systems  Review of Systems  Unable to perform ROS: Dementia     ____________________________________________  PHYSICAL EXAM:      VITAL SIGNS: ED Triage Vitals  Enc Vitals Group     BP      Pulse      Resp      Temp      Temp src      SpO2      Weight      Height      Head Circumference      [facility]      Pain Score      Pain Loc       Pain Edu?      Excl. in GC?      Physical Exam Vitals signs and nursing note reviewed.  Constitutional:      General: She is not in acute distress.    Appearance: She is well-developed. She is ill-appearing.  HENT:     Head: Normocephalic and atraumatic.     Mouth/Throat:     Mouth: Mucous membranes are dry.  Eyes:     Comments: Moderate bilateral, right greater than left, conjunctivitis.  Small amount of clear drainage from  the right eye.  No proptosis.  No periorbital erythema.  Neck:     Musculoskeletal: Neck supple.  Cardiovascular:     Rate and Rhythm: Regular rhythm. Tachycardia present.     Heart sounds: Normal heart sounds.  Pulmonary:     Effort: Respiratory distress present.     Breath sounds: No wheezing.     Comments: Bilateral rales, increased work of breathing with tachypnea Abdominal:     General: Abdomen is flat. There is no distension.  Musculoskeletal:     Right lower leg: Edema (Trace) present.     Left lower leg: Edema (Trace) present.  Skin:    General: Skin is warm.     Capillary Refill: Capillary refill takes less than 2 seconds.     Findings: No rash.  Neurological:     Mental Status: She is alert.     Motor: No abnormal muscle tone.     Comments: Disoriented.  Grimaces to pain in bilateral upper and lower extremities, but no purposeful movement.  Does not follow commands.  Protecting airway.       ____________________________________________   LABS (all labs ordered are listed, but only abnormal results are displayed)  Labs Reviewed  CBC WITH DIFFERENTIAL/PLATELET - Abnormal; Notable for the following components:      Result Value   WBC 30.3 (*)    MCV 107.6 (*)    Platelets 479 (*)    nRBC 0.4 (*)    Neutro Abs 28.0 (*)    Abs Immature Granulocytes 0.33 (*)    All other components within normal limits  COMPREHENSIVE METABOLIC PANEL - Abnormal; Notable for the following components:   Sodium 168 (*)    Chloride >130 (*)    CO2 18  (*)    Glucose, Bld 187 (*)    BUN 171 (*)    Creatinine, Ser 5.35 (*)    Total Protein 9.1 (*)    Albumin 2.9 (*)    AST 113 (*)    ALT 73 (*)    GFR calc non Af Amer 7 (*)    GFR calc Af Amer 8 (*)    All other components within normal limits  LACTIC ACID, PLASMA - Abnormal; Notable for the following components:   Lactic Acid, Venous 2.7 (*)    All other components within normal limits  FIBRIN DERIVATIVES D-DIMER ([facility] ONLY) - Abnormal; Notable for the following components:   Fibrin derivatives D-dimer [facility]) 4,319.65 (*)    All other components within normal limits  URINALYSIS, COMPLETE (UACMP) WITH MICROSCOPIC - Abnormal; Notable for the following components:   Color, Urine AMBER (*)    APPearance TURBID (*)    Hgb urine dipstick LARGE (*)    Protein, ur >=300 (*)    Leukocytes,Ua MODERATE (*)    RBC / HPF >50 (*)    WBC, UA >50 (*)    Bacteria, UA MANY (*)    All other components within normal limits  BLOOD GAS, VENOUS - Abnormal; Notable for the following components:   pCO2, Ven 33 (*)    pO2, Ven 71.0 (*)    All other components within normal limits  C-REACTIVE PROTEIN - Abnormal; Notable for the following components:   CRP 5.7 (*)    All other components within normal limits  CULTURE, BLOOD (ROUTINE X 2)  CULTURE, BLOOD (ROUTINE X 2)  URINE CULTURE  PROCALCITONIN  LACTIC ACID, PLASMA    ____________________________________________  EKG: Sinus tachycardia, ventricular rate 119.  PR  119, QRS 75, QTc 437.  PVC noted.  No acute ST elevations or depressions.  No evidence of acute ischemia or infarct. ________________________________________  RADIOLOGY All imaging, including plain films, CT scans, and ultrasounds, independently reviewed by me, and interpretations confirmed via formal radiology reads.  ED MD interpretation:   CXR: Bilateral bronchoPNA  Official radiology report(s): Dg Chest Portable 1 View  Result Date: 05/02/2019 CLINICAL DATA:  Fever.   Positive COVID test EXAM: PORTABLE CHEST 1 VIEW COMPARISON:  04/14/2018 FINDINGS: New streaky infiltrate at the lung bases. Normal heart size and mediastinal contours. No effusion or pneumothorax. IMPRESSION: Bronchopneumonia at the bases. Electronically Signed   By: Monte Fantasia M.D.   On: 05/02/2019 08:43    ____________________________________________  PROCEDURES   Procedure(s) performed (including Critical Care):  .Critical Care Performed by: Duffy Bruce, MD Authorized by: Duffy Bruce, MD   Critical care provider statement:    Critical care time (minutes):  45   Critical care time was exclusive of:  Separately billable procedures and treating other patients and teaching time   Critical care was necessary to treat or prevent imminent or life-threatening deterioration of the following conditions:  Cardiac failure, circulatory failure, respiratory failure and sepsis   Critical care was time spent personally by me on the following activities:  Development of treatment plan with patient or surrogate, discussions with consultants, evaluation of patient's response to treatment, examination of patient, obtaining history from patient or surrogate, ordering and performing treatments and interventions, ordering and review of laboratory studies, ordering and review of radiographic studies, pulse oximetry, re-evaluation of patient's condition and review of old charts   I assumed direction of critical care for this patient from another provider in my specialty: no      ____________________________________________  INITIAL IMPRESSION / MDM / [place] / ED COURSE  As part of my medical decision making, I reviewed the following data within the electronic MEDICAL RECORD NUMBER Notes from prior ED visits and  Controlled Substance Database      *ASHWINI JAGO was evaluated in Emergency Department on 05/02/2019 for the symptoms described in the history of present illness. She  was evaluated in the context of the global COVID-19 pandemic, which necessitated consideration that the patient might be at risk for infection with the SARS-CoV-2 virus that causes COVID-19. Institutional protocols and algorithms that pertain to the evaluation of patients at risk for COVID-19 are in a state of rapid change based on information released by regulatory bodies including the CDC and federal and state organizations. These policies and algorithms were followed during the patient's care in the ED.  Some ED evaluations and interventions may be delayed as a result of limited staffing during the pandemic.*   Clinical Course as of May 01 1902  Mon May 02, 2019  6073 Attempted to call son again, no success. Will continue to try.   [CI]  [phone] 78 yo F here with sepsis 2/2 COVID-19, likely superimposed bronchoPNA and possible UTI. Code sepsis initiated on arrival, but LA<4, BP normal and given COVID dx with DNR status, hesitant to fluid overload so 30 cc/kg not indicated. Will start broad-spectrum ABX, plan to admit.    [CI]  53 Labs show marked AKI, likely pre-renal with hypernatremia 2/2 hemoconcentration, marked leukocytosis w/ left shift, LA elevation c/w sepsis. UA is concerning for UTI as well. D-Dimer elevated - cannot obtain CT Angio, will defer anticoag to [facility]. Discussed with [facility], will admit. Son also updated and is  in agreement, confirms pt is DNR, no aggressive interventions (such as HD) but continue IV fluids, treatment.   [CI]    Clinical Course User Index [CI] Shaune PollackIsaacs, Shekela Goodridge, MD    Medical Decision Making:  As above.  ADDENDUM: [facility]. [facility]. Pt placed on mIVF, ABX, decadron. Attempted to call son/HCPOA 3x without success to update re: [facility] and re: poor prognosis.  ____________________________________________  FINAL CLINICAL IMPRESSION(S) / ED DIAGNOSES  Final diagnoses:  COVID-19  AKI (acute kidney injury)  (HCC)  Hypernatremia  Acute cystitis without hematuria     MEDICATIONS GIVEN DURING THIS VISIT:  Medications  lactated ringers infusion ( Intravenous New Bag/Given 05/02/19 1516)  rivastigmine (EXELON) capsule 1.5 mg (has no administration in time range)  valproate (DEPACON) 250 mg in dextrose 5 % 50 mL IVPB (has no administration in time range)  heparin injection 5,000 Units (has no administration in time range)  aztreonam (AZACTAM) injection 250 mg (has no administration in time range)  vancomycin variable dose per unstable renal function (pharmacist dosing) (has no administration in time range)  sodium chloride 0.9 % bolus 500 mL (0 mLs Intravenous Stopped 05/02/19 0933)  dexamethasone (DECADRON) injection 10 mg (10 mg Intravenous Given 05/02/19 0836)  vancomycin (VANCOCIN) IVPB 1000 mg/200 mL premix (0 mg Intravenous Stopped 05/02/19 1032)  aztreonam (AZACTAM) 2 g in sodium chloride 0.9 % 100 mL IVPB (0 g Intravenous Stopped 05/02/19 1004)  lactated ringers bolus 1,000 mL (0 mLs Intravenous Stopped 05/02/19 1319)     ED Discharge Orders    None       Note:  This document was prepared using Dragon voice recognition software and may include unintentional dictation errors.   Shaune PollackIsaacs, Maquita Sandoval, MD 05/02/19 1045    Shaune PollackIsaacs, Dezire Turk, MD 05/02/19 [phone]

## 2019-05-02 NOTE — ED Notes (Signed)
Resting quietly with eyes closed and even unlabored respirations. No acute distress noted at this time.

## 2019-05-02 NOTE — Progress Notes (Signed)
   [facility] Day: 0 days Tiffany Turner is a 77 y.o. female 78 y.o. female with a known history of advanced dementia, hypertension, hyperlipidemia, coronary artery disease, previous history of urinary tract infection who presents to the hospital due to altered mental status.  Patient's advanced age and multiple comorbidities I discussed patient's CODE STATUS with patient's son over the phone.  Explained to him the difference between DNR and a full code and that patient likely given her now Covid diagnosis and severe dehydration and acute kidney injury likely has a very poor prognosis.  Patient's son is aware and patient is a DNR.  Will get palliative care consult to discuss goals of care.   Advance care planning discussed with patient  without additional Family at bedside. All questions in regards to overall condition and expected prognosis answered. The decision was made to continue current code status  CODE STATUS: dnr Time spent: 16 minutes

## 2019-05-02 NOTE — ED Notes (Signed)
Pt incontinent of urine and small amount of loose stool. Cleaned and repositioned on stretcher. Pt placed on her left side with pillows supporting her back, between her legs, and under her heels. Redness noted to sacral region and right heel that does not blanch with palpation. Pt tolerated well. Provided for comfort and safety and will continue to assess.

## 2019-05-02 NOTE — Consult Note (Signed)
Pharmacy Antibiotic Note  Tiffany Turner is a 78 y.o. female admitted on 05/02/2019 with pneumonia and UTI.  Pharmacy has been consulted for Vancomycin and Aztreonam dosing(per patient and family, has severe allergy to PCNs/Cephalosporins and Sulfa). Patient also tested positive for COVID last week. Currently not hypoxic and doesn't show any evidence of respiratory distress. Will hold off on remdesivir treatment.  Plan: 1) Patient received Vancomycin 1g IV loading dose in the ED. Due to patient's severe AKI and dehydration, will dose off of random levels currently until AKI resolves and renal function improves.   A random vancomycin level has been ordered for 10/27@0800 . Will order Scr and continue to monitor renal function. A MRSA PCR has also been ordered and if negative, will recommend to discontinue medication.   1) Patient received Aztreonam 2g IV x 1 in ED. After discussion with hospitalist, will order next dose to be given 10/27@0600 .  Will order Aztreonam 500mg  Q8 hours. Will continue to monitor patient's renal function and adjust dosing as deemed necessary.   Height: 5\' 4"  (162.6 cm) Weight: 134 lb 7.7 oz (61 kg) IBW/kg (Calculated) : 54.7  Temp (24hrs), Avg:98 F (36.7 C), Min:97.5 F (36.4 C), Max:98.5 F (36.9 C)  Recent Labs  Lab 05/02/19 0826 05/02/19 1154  WBC 30.3*  --   CREATININE 5.35*  --   LATICACIDVEN 2.7* 1.4    Estimated Creatinine Clearance: 7.4 mL/min (A) (by C-G formula based on SCr of 5.35 mg/dL (H)).    Allergies  Allergen Reactions  . Penicillins Other (See Comments)    Has patient had a PCN reaction causing immediate rash, facial/tongue/throat swelling, SOB or lightheadedness with hypotension: Unknown Has patient had a PCN reaction causing severe rash involving mucus membranes or skin necrosis: Unknown Has patient had a PCN reaction that required hospitalization: Unknown Has patient had a PCN reaction occurring within the last 10 years:  Unknown If all of the above answers are "NO", then may proceed with Cephalosporin use.  . Sulfa Antibiotics Other (See Comments)    unknown    Antimicrobials this admission: Vancomycin 10/26 >>  Aztreonam 10/26 >>    Microbiology results: 10/26 BCx: pending 10/26 UCx: pending  10/26 MRSA PCR: pending  Thank you for allowing pharmacy to be a part of this patient's care.  Keeley Sussman A Maykayla Highley 05/02/2019 7:05 PM

## 2019-05-02 NOTE — ED Notes (Signed)
Attempted to contact [name], legal guardian; no answer and voice mail has not been set up.

## 2019-05-02 NOTE — ED Notes (Signed)
Pt's brief checked; no need to change at this time - dry.

## 2019-05-02 NOTE — ED Notes (Signed)
Recollected lactic

## 2019-05-02 NOTE — ED Triage Notes (Signed)
Pt via ems from [facility]. They were called out for unresponsive pt; stated pt is normally non verbal and demented, but that she was responsive to pain. Upon arrival, pt slightly responsive to painful stimuli ("chews" faster). Pt does not wake up to respond to questions.  Pt covid positive, no date of test given. Pt has MOST form, DNR.

## 2019-05-02 NOTE — Consult Note (Signed)
CODE SEPSIS - PHARMACY COMMUNICATION  **Broad Spectrum Antibiotics should be administered within 1 hour of Sepsis diagnosis**  Time Code Sepsis Called/Page Received: 5501  Antibiotics Ordered: vancomycin and aztreonam  Time of 1st antibiotic administration: 0930  Additional action taken by pharmacy: none required  If necessary, Name of Provider/Nurse Contacted: N/A    Dallie Piles ,PharmD Clinical Pharmacist  05/02/2019  10:50 AM

## 2019-05-02 NOTE — H&P (Signed)
[facility] NAME: Tiffany Turner    MR#:  [id number]  DATE OF BIRTH:  Apr 28, 1941  DATE OF ADMISSION:  05/02/2019  PRIMARY CARE PHYSICIAN: Patient, No Pcp Per   REQUESTING/REFERRING PHYSICIAN: Dr. Lindell Noe  CHIEF COMPLAINT:   Chief Complaint  Patient presents with  . Altered Mental Status    HISTORY OF PRESENT ILLNESS:  Tiffany Turner  is a 78 y.o. female with a known history of advanced dementia, hypertension, hyperlipidemia, coronary artery disease, previous history of urinary tract infection who presents to the hospital due to altered mental status.  Patient herself cannot provide a medical history therefore most of the history obtained from the ER physician and from the chart.  Patient apparently somewhat communicative at the skilled nursing facility but over the past few days she has been more lethargic and not as communicative.  She tested positive for COVID-19 a few days back and has not been more interactive and therefore sent to the ER for further evaluation.  Emergency room patient was noted to have urinary tract infection, also noted to be in acute kidney injury with severe hyponatremia.  She was going to be transferred to the [facility] but they do not have any beds presently and therefore patient is now being admitted here for further treatment evaluation.  PAST MEDICAL HISTORY:   Past Medical History:  Diagnosis Date  . Coronary artery disease    stents placed  . Dementia ([place])   . Hip dislocation, right (HCC)    hx of dislocation  . Hyperlipemia   . Hypertension     PAST SURGICAL HISTORY:   Past Surgical History:  Procedure Laterality Date  . CARDIAC SURGERY     stents  . CORONARY ANGIOPLASTY WITH STENT PLACEMENT    . TOTAL HIP ARTHROPLASTY Right     SOCIAL HISTORY:   Social History   Tobacco Use  . Smoking status: Current Every Day Smoker    Packs/day: 0.50    Types: Cigarettes   . Smokeless tobacco: Never Used  Substance Use Topics  . Alcohol use: No    FAMILY HISTORY:   Family History  Problem Relation Age of Onset  . CAD Father     DRUG ALLERGIES:   Allergies  Allergen Reactions  . Penicillins Other (See Comments)    Has patient had a PCN reaction causing immediate rash, facial/tongue/throat swelling, SOB or lightheadedness with hypotension: Unknown Has patient had a PCN reaction causing severe rash involving mucus membranes or skin necrosis: Unknown Has patient had a PCN reaction that required hospitalization: Unknown Has patient had a PCN reaction occurring within the last 10 years: Unknown If all of the above answers are "NO", then may proceed with Cephalosporin use.  . Sulfa Antibiotics Other (See Comments)    unknown    REVIEW OF SYSTEMS:   Review of Systems  Unable to perform ROS: Dementia    MEDICATIONS AT HOME:   Prior to Admission medications   Medication Sig Start Date End Date Taking? Authorizing Provider  acetaminophen (TYLENOL) 500 MG tablet Take 500 mg by mouth every 4 (four) hours as needed for fever or headache.   Yes [provider]  alum & mag hydroxide-simeth (MINTOX REGULAR STRENGTH) 580-998-33 MG/5ML suspension Take 30 mLs by mouth every 6 (six) hours as needed for indigestion or heartburn.   Yes [provider]  Amino Acids-Protein Hydrolys (FEEDING SUPPLEMENT, PRO-STAT SUGAR FREE 64,) LIQD Take  30 mLs by mouth daily.   Yes [provider]  ammonium lactate (LAC-HYDRIN) 12 % lotion Apply 1 application topically 2 (two) times daily. To bilateral feet   Yes [provider]  aspirin 81 MG tablet Take 81 mg by mouth daily.    Yes [provider]  atorvastatin (LIPITOR) 40 MG tablet Take 40 mg by mouth daily.   Yes [provider]  cholecalciferol (VITAMIN D3) 25 MCG (1000 UT) tablet Take 1,000 Units by mouth daily.   Yes [provider]  dexamethasone (DECADRON) 6  MG tablet Take 6 mg by mouth daily.   Yes [provider]  diclofenac sodium (VOLTAREN) 1 % GEL Apply 2 g topically 2 (two) times daily as needed (to wrist).    Yes [provider]  Ensure (ENSURE) Take 237 mLs by mouth 2 (two) times daily.   Yes [provider]  erythromycin ophthalmic ointment 1 application at bedtime.   Yes [provider]  fluticasone (FLONASE) 50 MCG/ACT nasal spray Place 2 sprays into both nostrils daily.    Yes [provider]  Glycerol Monooleate POWD Take 500 mg by mouth daily.   Yes [provider]  hydrocortisone (ANUSOL-HC) 25 MG suppository Place 25 mg rectally 2 (two) times daily as needed for hemorrhoids or anal itching.   Yes [provider]  magnesium hydroxide (MILK OF MAGNESIA) 400 MG/5ML suspension Take 30 mLs by mouth daily as needed for mild constipation.   Yes [provider]  Menthol-Zinc Oxide 0.45-20 % OINT Apply topically 3 (three) times daily as needed (to perineal area).   Yes [provider]  metoprolol succinate (TOPROL-XL) 25 MG 24 hr tablet Take 25 mg by mouth daily.   Yes [provider]  Multiple Vitamin (MULTIVITAMIN WITH MINERALS) TABS tablet Take 1 tablet by mouth daily.   Yes [provider]  Polyethyl Glycol-Propyl Glycol (SYSTANE) 0.4-0.3 % SOLN Place 1 drop into both eyes 2 (two) times daily.    Yes [provider]  risperiDONE (RISPERDAL) 1 MG tablet Take 1 mg by mouth 2 (two) times daily.   Yes [provider]  rivastigmine (EXELON) 1.5 MG capsule Take 1.5 mg by mouth 2 (two) times daily.   Yes [provider]  senna-docusate (SENOKOT-S) 8.6-50 MG tablet Take 1 tablet by mouth 2 (two) times daily.   Yes [provider]  tetrahydrozoline 0.05 % ophthalmic solution Place 3 drops into both eyes at bedtime.   Yes [provider]  traZODone (DESYREL) 50 MG tablet Take 50 mg by mouth at bedtime.   Yes  [provider]  valproic acid (DEPAKENE) 250 MG capsule Take 250 mg by mouth 2 (two) times daily.   Yes [provider]  zinc sulfate 220 (50 Zn) MG capsule Take 220 mg by mouth daily.   Yes [provider]      VITAL SIGNS:  Blood pressure (!) 142/106, pulse (!) 113, temperature (!) 97.5 F (36.4 C), temperature source Axillary, resp. rate (!) 22, height 5\' 4"  (1.626 m), weight 61 kg, SpO2 96 %.  PHYSICAL EXAMINATION:  Physical Exam  GENERAL:  78 y.o.-year-old patient lying in the bed lethargic/encephalopathic.  EYES: Pupils equal, round, reactive to light. No scleral icterus.  HEENT: Head atraumatic, normocephalic. Dry Oral Mucosa.  NECK:  Supple, no jugular venous distention. No thyroid enlargement, no tenderness.  LUNGS: Poor Resp. effort, no wheezing, rales, rhonchi. No use of accessory muscles of respiration.  CARDIOVASCULAR: S1,  S2 RRR. No murmurs, rubs, gallops, clicks.  ABDOMEN: Soft, nontender, nondistended. Bowel sounds present. No organomegaly or mass.  EXTREMITIES: No pedal edema, cyanosis, or clubbing. + 2 pedal & radial pulses b/l.   NEUROLOGIC: Globally weak and encephalopathic PSYCHIATRIC: Globally weak and encephalopathic SKIN: No obvious rash, lesion, or ulcer.   LABORATORY PANEL:   CBC Recent Labs  Lab 05/02/19 0826  WBC 30.3*  HGB 13.0  HCT 42.3  PLT 479*   ------------------------------------------------------------------------------------------------------------------  Chemistries  Recent Labs  Lab 05/02/19 0826  NA 168*  K 4.9  CL >130*  CO2 18*  GLUCOSE 187*  BUN 171*  CREATININE 5.35*  CALCIUM 9.2  AST 113*  ALT 73*  ALKPHOS 96  BILITOT 0.8   ------------------------------------------------------------------------------------------------------------------  Cardiac Enzymes No results for input(s): TROPONINI in the last 168 hours.  ------------------------------------------------------------------------------------------------------------------  RADIOLOGY:  Dg Chest Portable 1 View  Result Date: 05/02/2019 CLINICAL DATA:  Fever.  Positive COVID test EXAM: PORTABLE CHEST 1 VIEW COMPARISON:  04/14/2018 FINDINGS: New streaky infiltrate at the lung bases. Normal heart size and mediastinal contours. No effusion or pneumothorax. IMPRESSION: Bronchopneumonia at the bases. Electronically Signed   By: Marnee Spring M.D.   On: 05/02/2019 08:43     IMPRESSION AND PLAN:    78 y.o. female with a known history of advanced dementia, hypertension, hyperlipidemia, coronary artery disease, previous history of urinary tract infection who presents to the hospital due to altered mental status.   1.  Altered mental status-this is metabolic encephalopathy secondary to underlying dementia combined with severe dehydration and acute kidney injury and also due to UTI. -We will aggressively hydrate the patient with IV fluids, follow sodium, follow renal function, also treat underlying UTI with IV antibiotics with IV aztreonam and follow clinically.  2.  Sepsis-patient meets criteria given her tachycardia, leukocytosis and urinalysis suggestive of UTI and chest x-ray suggestive of bronchopneumonia. -We will treat the patient with broad-spectrum IV antibiotics with vancomycin, aztreonam and Zithromax.  Follow blood, urine cultures.  3.  COVID-19 pneumonia-patient noted to have tested positive for Covid last week.  She is from [facility]. -Clinically she is not hypoxic and does not use show any evidence of respiratory distress.  Chest x-ray does suggest bronchopneumonia. -We will treat with broad-spectrum IV antibiotics as mentioned above.  We will continue IV Decadron. -Continue airborne, contact precautions.  4.  UTI-we will treat the patient with IV aztreonam as mentioned has patient has been allergic. -Follow cultures.  5.   Leukocytosis-secondary to underlying sepsis/pneumonia and UTI. -Follow with IV antibiotic therapy.  6.  Acute kidney injury-secondary to severe dehydration and poor p.o. intake. -We will hydrate the patient with IV fluids, follow BUN/creatinine urine output.  Renal dose meds, avoid nephrotoxins.  7.  Hypernatremia-secondary to severe dehydration and poor p.o. intake. -We will treat the patient with D5W, follow sodium.  8.  History of dementia with behavioral disturbance-continue with Depakote, Risperdal, Exelon.  Given patient's advanced age and multiple comorbidities her prognosis is quite poor.  Patient is a DNR.     All the records are reviewed and case discussed with ED provider. Management plans discussed with the patient, family and they are in agreement.  CODE STATUS: DNR  TOTAL TIME TAKING CARE OF THIS PATIENT: 45 minutes.    Houston Siren M.D on 05/02/2019 at 4:00 PM  Between 7am to 6pm - Pager - [phone]  After 6pm go to [email or web address] - password [facility]  Office  [phone]  CC: Primary care physician; Patient, No Pcp Per

## 2019-05-02 NOTE — ED Notes (Signed)
Pt's son Gerald Stabs returned call; states that pt is verbally interactive normally. States he has not seen or spoken with her "in a while" because he is unable to go to facility. Told him she will transferred.

## 2019-05-02 NOTE — ED Notes (Signed)
Admitting at the bedside.  

## 2019-05-02 NOTE — Progress Notes (Signed)
PHARMACY -  BRIEF ANTIBIOTIC NOTE   Pharmacy has received consult(s) for Vancomycin and Azactam from an ED provider.  The patient's profile has been reviewed for ht/wt/allergies/indication/available labs.    One time order(s) placed for Vancomycin and Azactam by ED provider  Further antibiotics/pharmacy consults should be ordered by admitting physician if indicated.                       Thank you, Vira Blanco 05/02/2019  9:39 AM

## 2019-05-03 ENCOUNTER — Inpatient Hospital Stay (HOSPITAL_COMMUNITY)
Admission: AD | Admit: 2019-05-03 | Discharge: 2019-06-01 | DRG: 871 | Disposition: A | Discharge status: Other Acute Inpatient Hospital | Payer: Medicare Other | Source: Skilled Nursing Facility | Attending: Internal Medicine | Admitting: Internal Medicine

## 2019-05-03 ENCOUNTER — Other Ambulatory Visit: Payer: Self-pay

## 2019-05-03 DIAGNOSIS — Z978 Presence of other specified devices: Secondary | ICD-10-CM

## 2019-05-03 DIAGNOSIS — U071 COVID-19: Secondary | ICD-10-CM | POA: Diagnosis present

## 2019-05-03 DIAGNOSIS — F0281 Dementia in other diseases classified elsewhere with behavioral disturbance: Secondary | ICD-10-CM | POA: Diagnosis present

## 2019-05-03 DIAGNOSIS — E871 Hypo-osmolality and hyponatremia: Secondary | ICD-10-CM | POA: Diagnosis not present

## 2019-05-03 DIAGNOSIS — R64 Cachexia: Secondary | ICD-10-CM | POA: Diagnosis present

## 2019-05-03 DIAGNOSIS — R627 Adult failure to thrive: Secondary | ICD-10-CM | POA: Diagnosis not present

## 2019-05-03 DIAGNOSIS — F17210 Nicotine dependence, cigarettes, uncomplicated: Secondary | ICD-10-CM | POA: Diagnosis present

## 2019-05-03 DIAGNOSIS — G308 Other Alzheimer's disease: Secondary | ICD-10-CM | POA: Diagnosis not present

## 2019-05-03 DIAGNOSIS — E785 Hyperlipidemia, unspecified: Secondary | ICD-10-CM | POA: Diagnosis present

## 2019-05-03 DIAGNOSIS — E440 Moderate protein-calorie malnutrition: Secondary | ICD-10-CM | POA: Diagnosis present

## 2019-05-03 DIAGNOSIS — Z515 Encounter for palliative care: Secondary | ICD-10-CM | POA: Diagnosis present

## 2019-05-03 DIAGNOSIS — G301 Alzheimer's disease with late onset: Secondary | ICD-10-CM | POA: Diagnosis not present

## 2019-05-03 DIAGNOSIS — A4189 Other specified sepsis: Secondary | ICD-10-CM | POA: Diagnosis present

## 2019-05-03 DIAGNOSIS — R638 Other symptoms and signs concerning food and fluid intake: Secondary | ICD-10-CM | POA: Diagnosis not present

## 2019-05-03 DIAGNOSIS — Z79899 Other long term (current) drug therapy: Secondary | ICD-10-CM

## 2019-05-03 DIAGNOSIS — I2510 Atherosclerotic heart disease of native coronary artery without angina pectoris: Secondary | ICD-10-CM | POA: Diagnosis present

## 2019-05-03 DIAGNOSIS — E870 Hyperosmolality and hypernatremia: Secondary | ICD-10-CM | POA: Diagnosis present

## 2019-05-03 DIAGNOSIS — E872 Acidosis, unspecified: Secondary | ICD-10-CM | POA: Diagnosis present

## 2019-05-03 DIAGNOSIS — I10 Essential (primary) hypertension: Secondary | ICD-10-CM | POA: Diagnosis present

## 2019-05-03 DIAGNOSIS — J1289 Other viral pneumonia: Secondary | ICD-10-CM | POA: Diagnosis present

## 2019-05-03 DIAGNOSIS — E860 Dehydration: Secondary | ICD-10-CM | POA: Diagnosis present

## 2019-05-03 DIAGNOSIS — E861 Hypovolemia: Secondary | ICD-10-CM | POA: Diagnosis present

## 2019-05-03 DIAGNOSIS — R410 Disorientation, unspecified: Secondary | ICD-10-CM | POA: Diagnosis present

## 2019-05-03 DIAGNOSIS — Z23 Encounter for immunization: Secondary | ICD-10-CM

## 2019-05-03 DIAGNOSIS — G9341 Metabolic encephalopathy: Secondary | ICD-10-CM | POA: Diagnosis present

## 2019-05-03 DIAGNOSIS — N170 Acute kidney failure with tubular necrosis: Secondary | ICD-10-CM | POA: Diagnosis not present

## 2019-05-03 DIAGNOSIS — F0280 Dementia in other diseases classified elsewhere without behavioral disturbance: Secondary | ICD-10-CM | POA: Diagnosis not present

## 2019-05-03 DIAGNOSIS — Z7189 Other specified counseling: Secondary | ICD-10-CM | POA: Diagnosis not present

## 2019-05-03 DIAGNOSIS — Z96641 Presence of right artificial hip joint: Secondary | ICD-10-CM | POA: Diagnosis present

## 2019-05-03 DIAGNOSIS — J9601 Acute respiratory failure with hypoxia: Secondary | ICD-10-CM | POA: Diagnosis present

## 2019-05-03 DIAGNOSIS — T380X5A Adverse effect of glucocorticoids and synthetic analogues, initial encounter: Secondary | ICD-10-CM | POA: Diagnosis not present

## 2019-05-03 DIAGNOSIS — N3000 Acute cystitis without hematuria: Secondary | ICD-10-CM | POA: Diagnosis not present

## 2019-05-03 DIAGNOSIS — G309 Alzheimer's disease, unspecified: Secondary | ICD-10-CM | POA: Diagnosis present

## 2019-05-03 DIAGNOSIS — A419 Sepsis, unspecified organism: Principal | ICD-10-CM | POA: Diagnosis present

## 2019-05-03 DIAGNOSIS — E878 Other disorders of electrolyte and fluid balance, not elsewhere classified: Secondary | ICD-10-CM | POA: Diagnosis not present

## 2019-05-03 DIAGNOSIS — Z681 Body mass index (BMI) 19 or less, adult: Secondary | ICD-10-CM

## 2019-05-03 DIAGNOSIS — Z882 Allergy status to sulfonamides status: Secondary | ICD-10-CM

## 2019-05-03 DIAGNOSIS — N390 Urinary tract infection, site not specified: Secondary | ICD-10-CM | POA: Diagnosis present

## 2019-05-03 DIAGNOSIS — B964 Proteus (mirabilis) (morganii) as the cause of diseases classified elsewhere: Secondary | ICD-10-CM | POA: Diagnosis present

## 2019-05-03 DIAGNOSIS — N179 Acute kidney failure, unspecified: Secondary | ICD-10-CM | POA: Diagnosis present

## 2019-05-03 DIAGNOSIS — J189 Pneumonia, unspecified organism: Secondary | ICD-10-CM

## 2019-05-03 DIAGNOSIS — G300 Alzheimer's disease with early onset: Secondary | ICD-10-CM | POA: Diagnosis not present

## 2019-05-03 DIAGNOSIS — H109 Unspecified conjunctivitis: Secondary | ICD-10-CM | POA: Diagnosis present

## 2019-05-03 DIAGNOSIS — L89002 Pressure ulcer of unspecified elbow, stage 2: Secondary | ICD-10-CM | POA: Diagnosis not present

## 2019-05-03 DIAGNOSIS — Z955 Presence of coronary angioplasty implant and graft: Secondary | ICD-10-CM

## 2019-05-03 DIAGNOSIS — Z880 Allergy status to penicillin: Secondary | ICD-10-CM

## 2019-05-03 DIAGNOSIS — R0600 Dyspnea, unspecified: Secondary | ICD-10-CM

## 2019-05-03 DIAGNOSIS — E876 Hypokalemia: Secondary | ICD-10-CM | POA: Diagnosis not present

## 2019-05-03 DIAGNOSIS — G92 Toxic encephalopathy: Secondary | ICD-10-CM | POA: Diagnosis present

## 2019-05-03 DIAGNOSIS — L89616 Pressure-induced deep tissue damage of right heel: Secondary | ICD-10-CM | POA: Diagnosis present

## 2019-05-03 DIAGNOSIS — H1089 Other conjunctivitis: Secondary | ICD-10-CM | POA: Diagnosis present

## 2019-05-03 DIAGNOSIS — Z7982 Long term (current) use of aspirin: Secondary | ICD-10-CM

## 2019-05-03 DIAGNOSIS — Z8249 Family history of ischemic heart disease and other diseases of the circulatory system: Secondary | ICD-10-CM

## 2019-05-03 DIAGNOSIS — Z0189 Encounter for other specified special examinations: Secondary | ICD-10-CM

## 2019-05-03 DIAGNOSIS — Z66 Do not resuscitate: Secondary | ICD-10-CM | POA: Diagnosis present

## 2019-05-03 DIAGNOSIS — L8990 Pressure ulcer of unspecified site, unspecified stage: Secondary | ICD-10-CM | POA: Diagnosis present

## 2019-05-03 DIAGNOSIS — L89156 Pressure-induced deep tissue damage of sacral region: Secondary | ICD-10-CM | POA: Diagnosis present

## 2019-05-03 DIAGNOSIS — F02818 Dementia in other diseases classified elsewhere, unspecified severity, with other behavioral disturbance: Secondary | ICD-10-CM | POA: Diagnosis present

## 2019-05-03 DIAGNOSIS — L89311 Pressure ulcer of right buttock, stage 1: Secondary | ICD-10-CM | POA: Diagnosis present

## 2019-05-03 DIAGNOSIS — L89526 Pressure-induced deep tissue damage of left ankle: Secondary | ICD-10-CM | POA: Diagnosis present

## 2019-05-03 DIAGNOSIS — E11649 Type 2 diabetes mellitus with hypoglycemia without coma: Secondary | ICD-10-CM | POA: Diagnosis not present

## 2019-05-03 DIAGNOSIS — Z87440 Personal history of urinary (tract) infections: Secondary | ICD-10-CM

## 2019-05-03 LAB — CREATININE, SERUM
Creatinine, Ser: 3.66 mg/dL — ABNORMAL HIGH (ref 0.44–1.00)
GFR calc Af Amer: 13 mL/min — ABNORMAL LOW (ref >60)
GFR calc non Af Amer: 11 mL/min — ABNORMAL LOW (ref >60)

## 2019-05-03 LAB — CBC
HCT: 36.3 % (ref 36.0–46.0)
Hemoglobin: 11 g/dL — ABNORMAL LOW (ref 12.0–15.0)
MCH: 33.7 pg (ref 26.0–34.0)
MCHC: 30.3 g/dL (ref 30.0–36.0)
MCV: 111.3 fL — ABNORMAL HIGH (ref 80.0–100.0)
Platelets: 333 10*3/uL (ref 150–400)
RBC: 3.26 MIL/uL — ABNORMAL LOW (ref 3.87–5.11)
RDW: 14.1 % (ref 11.5–15.5)
WBC: 29.1 10*3/uL — ABNORMAL HIGH (ref 4.0–10.5)
nRBC: 0.2 % (ref 0.0–0.2)

## 2019-05-03 LAB — PROCALCITONIN: Procalcitonin: 0.54 ng/mL

## 2019-05-03 LAB — ABO/RH: ABO/RH(D): A POS

## 2019-05-03 LAB — LACTIC ACID, PLASMA: Lactic Acid, Venous: 1.4 mmol/L (ref 0.5–1.9)

## 2019-05-03 MED ORDER — PNEUMOCOCCAL VAC POLYVALENT 25 MCG/0.5ML IJ INJ
0.5000 mL | INJECTION | INTRAMUSCULAR | Status: AC
  Administered 2019-05-07: 0.5 mL via INTRAMUSCULAR
  Filled 2019-05-03 (×2): qty 0.5

## 2019-05-03 MED ORDER — DEXAMETHASONE SODIUM PHOSPHATE 10 MG/ML IJ SOLN
6.0000 mg | INTRAMUSCULAR | Status: AC
  Administered 2019-05-03 – 2019-05-12 (×10): 6 mg via INTRAVENOUS
  Filled 2019-05-03 (×10): qty 1

## 2019-05-03 MED ORDER — SODIUM CHLORIDE 0.9 % IV SOLN
100.0000 mg | INTRAVENOUS | Status: AC
  Administered 2019-05-04 – 2019-05-07 (×4): 100 mg via INTRAVENOUS
  Filled 2019-05-03 (×4): qty 20

## 2019-05-03 MED ORDER — VITAMIN C 500 MG PO TABS
500.0000 mg | ORAL_TABLET | Freq: Every day | ORAL | Status: DC
  Administered 2019-05-07 – 2019-05-14 (×8): 500 mg via ORAL
  Filled 2019-05-03 (×9): qty 1

## 2019-05-03 MED ORDER — SODIUM CHLORIDE 0.9 % IV SOLN
INTRAVENOUS | Status: DC
  Administered 2019-05-03 (×2): via INTRAVENOUS

## 2019-05-03 MED ORDER — SODIUM CHLORIDE 0.9 % IV SOLN
200.0000 mg | Freq: Once | INTRAVENOUS | Status: AC
  Administered 2019-05-03: 200 mg via INTRAVENOUS
  Filled 2019-05-03: qty 40

## 2019-05-03 MED ORDER — CIPROFLOXACIN IN D5W 200 MG/100ML IV SOLN
200.0000 mg | INTRAVENOUS | Status: DC
  Administered 2019-05-03: 17:00:00 200 mg via INTRAVENOUS
  Filled 2019-05-03 (×3): qty 100

## 2019-05-03 MED ORDER — HYDROCOD POLST-CPM POLST ER 10-8 MG/5ML PO SUER: 5.0000 mL | Freq: Two times a day (BID) | ORAL | Status: DC | PRN

## 2019-05-03 MED ORDER — LACTATED RINGERS IV SOLN
INTRAVENOUS | Status: DC
  Administered 2019-05-03 – 2019-05-04 (×2): via INTRAVENOUS

## 2019-05-03 MED ORDER — ZINC SULFATE 220 (50 ZN) MG PO CAPS
220.0000 mg | ORAL_CAPSULE | Freq: Every day | ORAL | Status: DC
  Administered 2019-05-07 – 2019-05-14 (×8): 220 mg via ORAL
  Filled 2019-05-03 (×9): qty 1

## 2019-05-03 MED ORDER — INFLUENZA VAC A&B SA ADJ QUAD 0.5 ML IM PRSY
0.5000 mL | PREFILLED_SYRINGE | INTRAMUSCULAR | Status: AC
  Administered 2019-05-07: 0.5 mL via INTRAMUSCULAR
  Filled 2019-05-03 (×2): qty 0.5

## 2019-05-03 MED ORDER — ACETAMINOPHEN 325 MG PO TABS
650.0000 mg | ORAL_TABLET | Freq: Four times a day (QID) | ORAL | Status: DC | PRN
  Administered 2019-05-07 – 2019-05-10 (×6): 650 mg via ORAL
  Filled 2019-05-03 (×6): qty 2

## 2019-05-03 MED ORDER — DEXTROSE 5 % IV SOLN
0.5000 g | Freq: Three times a day (TID) | INTRAVENOUS | Status: DC
  Administered 2019-05-03: 0.5 g via INTRAVENOUS
  Filled 2019-05-03 (×2): qty 0.5

## 2019-05-03 MED ORDER — GUAIFENESIN-DM 100-10 MG/5ML PO SYRP
10.0000 mL | ORAL_SOLUTION | ORAL | Status: DC | PRN
  Administered 2019-05-09 – 2019-05-12 (×2): 10 mL via ORAL
  Filled 2019-05-03 (×2): qty 10

## 2019-05-03 MED ORDER — VANCOMYCIN VARIABLE DOSE PER UNSTABLE RENAL FUNCTION (PHARMACIST DOSING): Status: DC

## 2019-05-03 MED ORDER — ENOXAPARIN SODIUM 30 MG/0.3ML ~~LOC~~ SOLN
30.0000 mg | SUBCUTANEOUS | Status: DC
  Administered 2019-05-03: 09:00:00 30 mg via SUBCUTANEOUS
  Filled 2019-05-03: qty 0.3

## 2019-05-03 MED ORDER — SODIUM CHLORIDE 0.9 % IV BOLUS
500.0000 mL | Freq: Once | INTRAVENOUS | Status: AC
  Administered 2019-05-03: 500 mL via INTRAVENOUS

## 2019-05-03 MED ORDER — ONDANSETRON HCL 4 MG/2ML IJ SOLN: 4.0000 mg | Freq: Four times a day (QID) | INTRAMUSCULAR | Status: DC | PRN

## 2019-05-03 MED ORDER — ONDANSETRON HCL 4 MG PO TABS: 4.0000 mg | ORAL_TABLET | Freq: Four times a day (QID) | ORAL | Status: DC | PRN

## 2019-05-03 MED ORDER — ORAL CARE MOUTH RINSE
15.0000 mL | Freq: Two times a day (BID) | OROMUCOSAL | Status: DC
  Administered 2019-05-03 – 2019-06-01 (×58): 15 mL via OROMUCOSAL

## 2019-05-03 NOTE — Consult Note (Signed)
[facility] Nurse wound consult note Patient is COVID + receiving care in [place].  I have attempted to camera in via [place] and am unable to locate the room number in that system.  I have reviewed the flowsheet entries by the bedside nurses.  This consult was completed after review of record; no images area available. 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 Pressure Injury POA: Yes Measurement, Wound bed,Drainage (amount, consistency, odor) available in flowsheet section Dressing procedure/placement/frequency:  I have ordered foam dressings to the referenced locations, an air mattress, and bilateral Prevalon boots. Monitor the wound area(s) for worsening of condition such as: Signs/symptoms of infection,  Increase in size,  Development of or worsening of odor, Development of pain, or increased pain at the affected locations.  Notify the medical team if any of these develop.  Thank you for the consult.  [place] nurse will not follow at this time.  Please re-consult the [place] team if needed.  Val Riles, RN, MSN, CWOCN, CNS-BC, pager [phone]

## 2019-05-03 NOTE — Progress Notes (Signed)
Attempted to call son Kemya Shed to inform him of pt arrival.  No answer, unable to leave message - no voice mail.

## 2019-05-03 NOTE — Consult Note (Signed)
Pharmacy Antibiotic Note  Tiffany Turner is a 78 y.o. female admitted on 05/02/2019 with pneumonia and UTI, possible bacteremia.  Pharmacy has been consulted for Vancomycin and Aztreonam dosing(per patient and family, has severe allergy to PCNs/Cephalosporins and Sulfa). Patient also tested positive for COVID last week.   Transferred from [facility] to [facility].  Plan: 1) Patient received Vancomycin 1g IV loading dose in the ED at [facility]. Due to patient's severe AKI and dehydration, will dose off of random levels currently until AKI resolves and renal function improves.   A random vancomycin level has been ordered for 10/28 with a.m. labs. Will order Scr and continue to monitor renal function.  2) Patient received Aztreonam 2g IV x 1 in ED at [facility]. Aztreonam 500mg  Q8 hours. Will continue to monitor patient's renal function and adjust dosing as deemed necessary.      Temp (24hrs), Avg:97.5 F (36.4 C), Min:96.5 F (35.8 C), Max:98.5 F (36.9 C)  Recent Labs  Lab 05/02/19 0826 05/02/19 1154  WBC 30.3*  --   CREATININE 5.35*  --   LATICACIDVEN 2.7* 1.4    Estimated Creatinine Clearance: 7.4 mL/min (A) (by C-G formula based on SCr of 5.35 mg/dL (H)).    Allergies  Allergen Reactions  . Penicillins Other (See Comments)    Has patient had a PCN reaction causing immediate rash, facial/tongue/throat swelling, SOB or lightheadedness with hypotension: Unknown Has patient had a PCN reaction causing severe rash involving mucus membranes or skin necrosis: Unknown Has patient had a PCN reaction that required hospitalization: Unknown Has patient had a PCN reaction occurring within the last 10 years: Unknown If all of the above answers are "NO", then may proceed with Cephalosporin use.  . Sulfa Antibiotics Other (See Comments)    unknown    Antimicrobials this admission: Vancomycin 10/26 >>  Aztreonam 10/26 >>    Microbiology results: 10/26 [facility]): pending 10/26 [facility]):  pending   Thank you for allowing pharmacy to be a part of this patient's care.  Sherlon Handing, PharmD, BCPS [facility] Clinical pharmacist phone [phone] 05/03/2019 12:52 AM

## 2019-05-03 NOTE — Progress Notes (Signed)
  Progress Note   MAIGEN MOZINGO EQA:[id number] DOB: Jul 22, 1940 DOA: 05/02/2019  PCP: Patient, No Pcp Per  Patient coming from:  snf  Chief Complaint:   confusion  **DAY ZERO NOTE - Please see H/P from earlier today  Patient is a pleasantly demented, at baseline quite conversational 78 year old female who presents from nursing facility for worsening mental status and confusion; subsequently found to have normal UA and Covid positive at outside facility transferred here for Covid positive status.  Sepsis 2/2 UTI, POA -de-escalate antibiotics from azithromycin and vancomycin to Cipro given no history of extended spectrum resistance. Continue to follow cultures.  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  Son indicates mental status depression is distant with history of UTIs -really takes 1 to 72 hours for resolution of mental status.  Severe AKI without history of CKD-creatinine elevated over 5 on admission, baseline appears to be around 1.  Continue IV fluids, if not downtrending appropriately with IV fluids given poor p.o. intake and likely severe dehydration would consider further imaging.  Severe hypovolemic hypernatremia/hyperchloremia-continue IV fluids - transition from NS to LR in hopes to normalize sodium and chloride BMP Latest Ref Rng & Units 05/03/2019 05/02/2019 05/01/2018  Glucose 70 - 99 mg/dL - 187(H) 91  BUN 8 - 23 mg/dL - 171(H) 10  Creatinine 0.44 - 1.00 mg/dL 3.66(H) 5.35(H) 0.80  Sodium 135 - 145 mmol/L - 168(HH) 138  Potassium 3.5 - 5.1 mmol/L - 4.9 4.0  Chloride 98 - 111 mmol/L - >130(HH) 107  CO2 22 - 32 mmol/L - 18(L) 25  Calcium 8.9 - 10.3 mg/dL - 9.2 8.0(L)    HTN, essential-resume home meds once awake and able to take PO safely. Would benefit from speech eval in 24-48h if more awake.  COVID-19-infection - hypoxic since admission - CXR at admission personally reviewed with ? RLL  atelectasis - continue decadron. Follow inflammatory markers. Will initiate remdesivir given CXR findings and hypoxia. Follow procalcitonin - if negative and clinically worsening or CRP climbs >10 would consider actemra.  [facility]  If 7PM-7AM, please contact night-coverage [email or web address] Password [email or web address]  05/03/2019, 7:40 AM

## 2019-05-03 NOTE — H&P (Signed)
History and Physical    Tiffany Turner RXV:[id number] DOB: 09/28/1940 DOA: 05/02/2019  PCP: Patient, No Pcp Per  Patient coming from:  snf  Chief Complaint:   confusion  HPI: Tiffany Turner is a 78 y.o. female with medical history significant of advanced dementia, htn, CAD presents with confusion over the last several days.  Pt with h/o freq utis.  Cannot provide any history due to her dementia.  All history obtained from records.  Found to be septic from uti with aki cr over 5 and covid positive.  Given vanc and azactam.   Review of Systems:  Unobtainable due to dementia  Past Medical History:  Diagnosis Date  . Coronary artery disease    stents placed  . Dementia (HCC)   . Hip dislocation, right (HCC)    hx of dislocation  . Hyperlipemia   . Hypertension     Past Surgical History:  Procedure Laterality Date  . CARDIAC SURGERY     stents  . CORONARY ANGIOPLASTY WITH STENT PLACEMENT    . TOTAL HIP ARTHROPLASTY Right      reports that she has been smoking cigarettes. She has been smoking about 0.50 packs per day. She has never used smokeless tobacco. She reports that she does not drink alcohol or use drugs.  Allergies  Allergen Reactions  . Penicillins Other (See Comments)    Has patient had a PCN reaction causing immediate rash, facial/tongue/throat swelling, SOB or lightheadedness with hypotension: Unknown Has patient had a PCN reaction causing severe rash involving mucus membranes or skin necrosis: Unknown Has patient had a PCN reaction that required hospitalization: Unknown Has patient had a PCN reaction occurring within the last 10 years: Unknown If all of the above answers are "NO", then may proceed with Cephalosporin use.  . Sulfa Antibiotics Other (See Comments)    unknown    Family History  Problem Relation Age of Onset  . CAD Father     Prior to Admission medications   Medication Sig Start Date End Date Taking? Authorizing Provider   acetaminophen (TYLENOL) 500 MG tablet Take 500 mg by mouth every 4 (four) hours as needed for fever or headache.    [provider]  alum & mag hydroxide-simeth (MINTOX REGULAR STRENGTH) 200-200-20 MG/5ML suspension Take 30 mLs by mouth every 6 (six) hours as needed for indigestion or heartburn.    [provider]  Amino Acids-Protein Hydrolys (FEEDING SUPPLEMENT, PRO-STAT SUGAR FREE 64,) LIQD Take 30 mLs by mouth daily.    [provider]  ammonium lactate (LAC-HYDRIN) 12 % lotion Apply 1 application topically 2 (two) times daily. To bilateral feet    [provider]  aspirin 81 MG tablet Take 81 mg by mouth daily.     [provider]  atorvastatin (LIPITOR) 40 MG tablet Take 40 mg by mouth daily.    [provider]  cholecalciferol (VITAMIN D3) 25 MCG (1000 UT) tablet Take 1,000 Units by mouth daily.    [provider]  dexamethasone (DECADRON) 6 MG tablet Take 6 mg by mouth daily.    [provider]  diclofenac sodium (VOLTAREN) 1 % GEL Apply 2 g topically 2 (two) times daily as needed (to wrist).     [provider]  Ensure (ENSURE) Take 237 mLs by mouth 2 (two) times daily.    [provider]  erythromycin ophthalmic ointment 1 application at bedtime.    [provider]  fluticasone (FLONASE) 50 MCG/ACT nasal spray  Place 2 sprays into both nostrils daily.     [provider]  Glycerol Monooleate POWD Take 500 mg by mouth daily.    [provider]  hydrocortisone (ANUSOL-HC) 25 MG suppository Place 25 mg rectally 2 (two) times daily as needed for hemorrhoids or anal itching.    [provider]  magnesium hydroxide (MILK OF MAGNESIA) 400 MG/5ML suspension Take 30 mLs by mouth daily as needed for mild constipation.    [provider]  Menthol-Zinc Oxide 0.45-20 % OINT Apply topically 3 (three) times daily as needed (to perineal area).    [provider]   metoprolol succinate (TOPROL-XL) 25 MG 24 hr tablet Take 25 mg by mouth daily.    [provider]  Multiple Vitamin (MULTIVITAMIN WITH MINERALS) TABS tablet Take 1 tablet by mouth daily.    [provider]  Polyethyl Glycol-Propyl Glycol (SYSTANE) 0.4-0.3 % SOLN Place 1 drop into both eyes 2 (two) times daily.     [provider]  risperiDONE (RISPERDAL) 1 MG tablet Take 1 mg by mouth 2 (two) times daily.    [provider]  rivastigmine (EXELON) 1.5 MG capsule Take 1.5 mg by mouth 2 (two) times daily.    [provider]  senna-docusate (SENOKOT-S) 8.6-50 MG tablet Take 1 tablet by mouth 2 (two) times daily.    [provider]  tetrahydrozoline 0.05 % ophthalmic solution Place 3 drops into both eyes at bedtime.    [provider]  traZODone (DESYREL) 50 MG tablet Take 50 mg by mouth at bedtime.    [provider]  valproic acid (DEPAKENE) 250 MG capsule Take 250 mg by mouth 2 (two) times daily.    [provider]  zinc sulfate 220 (50 Zn) MG capsule Take 220 mg by mouth daily.    [provider]    Physical Exam: Vitals:   05/03/19 0015  BP: 136/83  Pulse: 80  Resp: 12  Temp: (!) 96.5 F (35.8 C)  TempSrc: Axillary  SpO2: 92%      Constitutional: NAD, calm, comfortable Vitals:   05/03/19 0015  BP: 136/83  Pulse: 80  Resp: 12  Temp: (!) 96.5 F (35.8 C)  TempSrc: Axillary  SpO2: 92%   Eyes: PERRL, lids and conjunctivae normal ENMT: Mucous membranes are moist. Posterior pharynx clear of any exudate or lesions.Normal dentition.  Neck: normal, supple, no masses, no thyromegaly Respiratory: clear to auscultation bilaterally, no wheezing, no crackles. Normal respiratory effort. No accessory muscle use.  Cardiovascular: Regular rate and rhythm, no murmurs / rubs / gallops. No extremity edema. 2+ pedal pulses. No carotid bruits.  Abdomen: no tenderness, no masses palpated. No  hepatosplenomegaly. Bowel sounds positive.  Musculoskeletal: no clubbing / cyanosis. No joint deformity upper and lower extremities. Good ROM, no contractures. Normal muscle tone.  Skin: no rashes, lesions, ulcers. No induration Neurologic: CN 2-12 grossly intact.  Psychiatric:  Adv dementia   Labs on Admission: I have personally reviewed following labs and imaging studies  CBC: Recent Labs  Lab 05/02/19 0826  WBC 30.3*  NEUTROABS 28.0*  HGB 13.0  HCT 42.3  MCV 107.6*  PLT 540*   Basic Metabolic Panel: Recent Labs  Lab 05/02/19 0826  NA 168*  K 4.9  CL >130*  CO2 18*  GLUCOSE 187*  BUN 171*  CREATININE 5.35*  CALCIUM 9.2   GFR: Estimated Creatinine Clearance: 7.4 mL/min (A) (by C-G formula based on SCr of 5.35 mg/dL (H)). Liver Function Tests:  Recent Labs  Lab 05/02/19 0826  AST 113*  ALT 73*  ALKPHOS 96  BILITOT 0.8  PROT 9.1*  ALBUMIN 2.9*   No results for input(s): LIPASE, AMYLASE in the last 168 hours. No results for input(s): AMMONIA in the last 168 hours. Coagulation Profile: No results for input(s): INR, PROTIME in the last 168 hours. Cardiac Enzymes: No results for input(s): CKTOTAL, CKMB, CKMBINDEX, TROPONINI in the last 168 hours. BNP (last 3 results) No results for input(s): PROBNP in the last 8760 hours. HbA1C: No results for input(s): HGBA1C in the last 72 hours. CBG: No results for input(s): GLUCAP in the last 168 hours. Lipid Profile: No results for input(s): CHOL, HDL, LDLCALC, TRIG, CHOLHDL, LDLDIRECT in the last 72 hours. Thyroid Function Tests: No results for input(s): TSH, T4TOTAL, FREET4, T3FREE, THYROIDAB in the last 72 hours. Anemia Panel: No results for input(s): VITAMINB12, FOLATE, FERRITIN, TIBC, IRON, RETICCTPCT in the last 72 hours. Urine analysis:    Component Value Date/Time   COLORURINE AMBER (A) 05/02/2019 0826   APPEARANCEUR TURBID (A) 05/02/2019 0826   APPEARANCEUR Turbid 07/29/2014 1548   LABSPEC 1.020 05/02/2019  0826   LABSPEC 1.018 07/29/2014 1548   PHURINE 7.0 05/02/2019 0826   GLUCOSEU NEGATIVE 05/02/2019 0826   GLUCOSEU Negative 07/29/2014 1548   HGBUR LARGE (A) 05/02/2019 0826   BILIRUBINUR NEGATIVE 05/02/2019 0826   KETONESUR NEGATIVE 05/02/2019 0826   PROTEINUR >=300 (A) 05/02/2019 0826   NITRITE NEGATIVE 05/02/2019 0826   LEUKOCYTESUR MODERATE (A) 05/02/2019 0826   LEUKOCYTESUR 3+ 07/29/2014 1548   Sepsis Labs: !!!!!!!!!!!!!!!!!!!!!!!!!!!!!!!!!!!!!!!!!!!! @LABRCNTIP (procalcitonin:4,lacticidven:4) )No results found for this or any previous visit (from the past 240 hour(s)).   Radiological Exams on Admission: Dg Chest Portable 1 View  Result Date: 05/02/2019 CLINICAL DATA:  Fever.  Positive COVID test EXAM: PORTABLE CHEST 1 VIEW COMPARISON:  04/14/2018 FINDINGS: New streaky infiltrate at the lung bases. Normal heart size and mediastinal contours. No effusion or pneumothorax. IMPRESSION: Bronchopneumonia at the bases. Electronically Signed   By: Marnee SpringJonathon  Watts M.D.   On: 05/02/2019 08:43   Old chart reviewed cxr reviewed basilar opacities  Assessment/Plan 78 yo female with advanced dementia comes in with encephalopathy due to urosepsis also with covid Principal Problem:   Sepsis secondary to UTI [facility](HCC)- main issue is her sepsis from uti and aki.  Follow up on urine and blood cx.  Place on vancomycin and azactam iv renally dosed by pharmacy.  Serial lactic acid.  Reck procal also and follow.    Active Problems:   Acute renal failure (HCC)- cr above 5, due to profound dehydration and sepsis.  Give another liter bolus ns now.  Repeat labs in am.  Family wishes no HD.    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  Mild  opacities on cxr at bases, cont decadron    DVT prophylaxis: lovenox Code Status:  DNR Family Communication: none Disposition Plan:  days Consults called: none Admission status:  admission   Isadora Delorey A MD [facility]  If 7PM-7AM, please contact night-coverage [email or web address] Password [email or web address]  05/03/2019, 12:44 AM

## 2019-05-03 NOTE — Progress Notes (Signed)
   Vital Signs MEWS/VS Documentation      05/03/2019 0015         MEWS Score:  5   MEWS Score Color:  Red   Resp:  12   Pulse:  80   BP:  136/83   Temp:  (!) 96.5 F (35.8 C)   O2 Device:  Nasal Cannula   O2 Flow Rate (L/min):  4 L/min   Level of Consciousness:  Unresponsive      MD notified.  Charge notified.  RRT notified.       Mateo Flow, Coden Franchi K 05/03/2019,12:48 AM

## 2019-05-03 NOTE — Progress Notes (Addendum)
0730: Assumed care of Pt from night RN. VSS stable  0830: Pt assessed. Remains unresponsive to pain. BP stable. RR irregular. SpO2 99% on 4 L [place]. Steady tremors in right arm. NS infusing through left wrist IV. Purewick in place. Cleaned for incontinent bowel and leakage around purewick. Foam dressings applied to wounds per order. Requested air mattress and boots from charge RN. Repositioned on side. Bed in low position. Will continue to monitor  1000: With movement from repositioning and cleaning from incontinence, Pt let out a couple moans. Still not responding to commands or opening eyes. VS remain within same parameters.  1210: Pt opened eyes briefly to pain, but would not respond to any other stimulus.  Attempted to call legal guardian to give update with no answer and no way to leave voicemail. Will attempt again later.  1600: Pt still intermittently responding to pain. VS within same limits for Pt. Cleaned for bowel and bladder. Repositioned, NS infusing per order, requested prev boots and air mattress again, will continue to monitor.   1800: Pt cleaned and repositioned. LR infusing. Purewick in place. Will continue to monitor

## 2019-05-04 ENCOUNTER — Encounter (HOSPITAL_COMMUNITY): Payer: Self-pay

## 2019-05-04 DIAGNOSIS — F0280 Dementia in other diseases classified elsewhere without behavioral disturbance: Secondary | ICD-10-CM

## 2019-05-04 DIAGNOSIS — F0281 Dementia in other diseases classified elsewhere with behavioral disturbance: Secondary | ICD-10-CM

## 2019-05-04 DIAGNOSIS — U071 COVID-19: Secondary | ICD-10-CM

## 2019-05-04 DIAGNOSIS — G300 Alzheimer's disease with early onset: Secondary | ICD-10-CM

## 2019-05-04 DIAGNOSIS — G309 Alzheimer's disease, unspecified: Secondary | ICD-10-CM | POA: Diagnosis present

## 2019-05-04 DIAGNOSIS — E878 Other disorders of electrolyte and fluid balance, not elsewhere classified: Secondary | ICD-10-CM | POA: Diagnosis present

## 2019-05-04 DIAGNOSIS — I10 Essential (primary) hypertension: Secondary | ICD-10-CM

## 2019-05-04 DIAGNOSIS — G301 Alzheimer's disease with late onset: Secondary | ICD-10-CM

## 2019-05-04 DIAGNOSIS — H109 Unspecified conjunctivitis: Secondary | ICD-10-CM | POA: Diagnosis present

## 2019-05-04 DIAGNOSIS — N170 Acute kidney failure with tubular necrosis: Secondary | ICD-10-CM

## 2019-05-04 DIAGNOSIS — E870 Hyperosmolality and hypernatremia: Secondary | ICD-10-CM | POA: Diagnosis present

## 2019-05-04 LAB — COMPREHENSIVE METABOLIC PANEL
ALT: 41 U/L (ref 0–44)
AST: 66 U/L — ABNORMAL HIGH (ref 15–41)
Albumin: 2.2 g/dL — ABNORMAL LOW (ref 3.5–5.0)
Alkaline Phosphatase: 104 U/L (ref 38–126)
BUN: 128 mg/dL — ABNORMAL HIGH (ref 8–23)
CO2: 20 mmol/L — ABNORMAL LOW (ref 22–32)
Calcium: 8.8 mg/dL — ABNORMAL LOW (ref 8.9–10.3)
Chloride: >130 mmol/L (ref 98–111)
Creatinine, Ser: 2.2 mg/dL — ABNORMAL HIGH (ref 0.44–1.00)
GFR calc Af Amer: 24 mL/min — ABNORMAL LOW (ref >60)
GFR calc non Af Amer: 21 mL/min — ABNORMAL LOW (ref >60)
Glucose, Bld: 103 mg/dL — ABNORMAL HIGH (ref 70–99)
Potassium: 4.9 mmol/L (ref 3.5–5.1)
Sodium: 168 mmol/L (ref 135–145)
Total Bilirubin: 0.5 mg/dL (ref 0.3–1.2)
Total Protein: 6.8 g/dL (ref 6.5–8.1)

## 2019-05-04 LAB — CBC WITH DIFFERENTIAL/PLATELET
Abs Immature Granulocytes: 0.59 10*3/uL — ABNORMAL HIGH (ref 0.00–0.07)
Basophils Absolute: 0.1 10*3/uL (ref 0.0–0.1)
Basophils Relative: 0 %
Eosinophils Absolute: 0 10*3/uL (ref 0.0–0.5)
Eosinophils Relative: 0 %
HCT: 33.2 % — ABNORMAL LOW (ref 36.0–46.0)
Hemoglobin: 10.1 g/dL — ABNORMAL LOW (ref 12.0–15.0)
Immature Granulocytes: 3 %
Lymphocytes Relative: 4 %
Lymphs Abs: 0.8 10*3/uL (ref 0.7–4.0)
MCH: 33.4 pg (ref 26.0–34.0)
MCHC: 30.4 g/dL (ref 30.0–36.0)
MCV: 109.9 fL — ABNORMAL HIGH (ref 80.0–100.0)
Monocytes Absolute: 0.6 10*3/uL (ref 0.1–1.0)
Monocytes Relative: 3 %
Neutro Abs: 19.3 10*3/uL — ABNORMAL HIGH (ref 1.7–7.7)
Neutrophils Relative %: 90 %
Platelets: 347 10*3/uL (ref 150–400)
RBC: 3.02 MIL/uL — ABNORMAL LOW (ref 3.87–5.11)
RDW: 14.1 % (ref 11.5–15.5)
WBC: 21.3 10*3/uL — ABNORMAL HIGH (ref 4.0–10.5)
nRBC: 0.2 % (ref 0.0–0.2)

## 2019-05-04 LAB — BASIC METABOLIC PANEL
BUN: 145 mg/dL — ABNORMAL HIGH (ref 8–23)
BUN: 119 mg/dL — ABNORMAL HIGH (ref 8–23)
BUN: 113 mg/dL — ABNORMAL HIGH (ref 8–23)
CO2: 20 mmol/L — ABNORMAL LOW (ref 22–32)
CO2: 20 mmol/L — ABNORMAL LOW (ref 22–32)
CO2: 20 mmol/L — ABNORMAL LOW (ref 22–32)
Calcium: 8.5 mg/dL — ABNORMAL LOW (ref 8.9–10.3)
Calcium: 8.9 mg/dL (ref 8.9–10.3)
Calcium: 8.5 mg/dL — ABNORMAL LOW (ref 8.9–10.3)
Chloride: >130 mmol/L (ref 98–111)
Chloride: >130 mmol/L (ref 98–111)
Chloride: >130 mmol/L (ref 98–111)
Creatinine, Ser: 2.4 mg/dL — ABNORMAL HIGH (ref 0.44–1.00)
Creatinine, Ser: 1.91 mg/dL — ABNORMAL HIGH (ref 0.44–1.00)
Creatinine, Ser: 1.78 mg/dL — ABNORMAL HIGH (ref 0.44–1.00)
GFR calc Af Amer: 22 mL/min — ABNORMAL LOW (ref >60)
GFR calc Af Amer: 28 mL/min — ABNORMAL LOW (ref >60)
GFR calc Af Amer: 31 mL/min — ABNORMAL LOW (ref >60)
GFR calc non Af Amer: 19 mL/min — ABNORMAL LOW (ref >60)
GFR calc non Af Amer: 24 mL/min — ABNORMAL LOW (ref >60)
GFR calc non Af Amer: 27 mL/min — ABNORMAL LOW (ref >60)
Glucose, Bld: 137 mg/dL — ABNORMAL HIGH (ref 70–99)
Glucose, Bld: 120 mg/dL — ABNORMAL HIGH (ref 70–99)
Glucose, Bld: 223 mg/dL — ABNORMAL HIGH (ref 70–99)
Potassium: 4.4 mmol/L (ref 3.5–5.1)
Potassium: 4.5 mmol/L (ref 3.5–5.1)
Potassium: 4.4 mmol/L (ref 3.5–5.1)
Sodium: 165 mmol/L (ref 135–145)
Sodium: 165 mmol/L (ref 135–145)
Sodium: 161 mmol/L (ref 135–145)

## 2019-05-04 LAB — URINE CULTURE: Culture: >=100000 — AB

## 2019-05-04 LAB — PROCALCITONIN: Procalcitonin: 0.62 ng/mL

## 2019-05-04 LAB — MAGNESIUM: Magnesium: 2.4 mg/dL (ref 1.7–2.4)

## 2019-05-04 LAB — D-DIMER, QUANTITATIVE: D-Dimer, Quant: 3.09 ug/mL-FEU — ABNORMAL HIGH (ref 0.00–0.50)

## 2019-05-04 LAB — C-REACTIVE PROTEIN: CRP: 4 mg/dL — ABNORMAL HIGH (ref <1.0)

## 2019-05-04 MED ORDER — DEXTROSE 5 % IV SOLN
INTRAVENOUS | Status: DC
  Administered 2019-05-04 – 2019-05-07 (×8): via INTRAVENOUS

## 2019-05-04 MED ORDER — SODIUM CHLORIDE 0.9 % IV SOLN
1.0000 g | INTRAVENOUS | Status: AC
  Administered 2019-05-04 – 2019-05-08 (×5): 1 g via INTRAVENOUS
  Filled 2019-05-04 (×5): qty 10

## 2019-05-04 MED ORDER — SODIUM CHLORIDE 0.9 % IV SOLN: 1.0000 g | INTRAVENOUS | Status: DC

## 2019-05-04 MED ORDER — FOSFOMYCIN TROMETHAMINE 3 G PO PACK
3.0000 g | PACK | Freq: Once | ORAL | Status: DC
  Filled 2019-05-04: qty 3

## 2019-05-04 MED ORDER — ERYTHROMYCIN 5 MG/GM OP OINT
TOPICAL_OINTMENT | Freq: Four times a day (QID) | OPHTHALMIC | Status: DC
  Administered 2019-05-04: 1 via OPHTHALMIC
  Administered 2019-05-05 (×2): via OPHTHALMIC
  Administered 2019-05-05: 1 via OPHTHALMIC
  Administered 2019-05-05 – 2019-05-07 (×9): via OPHTHALMIC
  Administered 2019-05-08: 1 via OPHTHALMIC
  Administered 2019-05-08 (×4): via OPHTHALMIC
  Administered 2019-05-09: 1 via OPHTHALMIC
  Administered 2019-05-09: 09:00:00 via OPHTHALMIC
  Administered 2019-05-09 – 2019-05-10 (×2): 1 via OPHTHALMIC
  Administered 2019-05-10 – 2019-05-13 (×15): via OPHTHALMIC
  Administered 2019-05-13 – 2019-05-14 (×2): 1 via OPHTHALMIC
  Administered 2019-05-14 – 2019-05-30 (×60): via OPHTHALMIC
  Filled 2019-05-04 (×3): qty 3.5

## 2019-05-04 MED ORDER — ENOXAPARIN SODIUM 30 MG/0.3ML ~~LOC~~ SOLN
30.0000 mg | SUBCUTANEOUS | Status: DC
  Administered 2019-05-04 – 2019-05-06 (×3): 30 mg via SUBCUTANEOUS
  Filled 2019-05-04 (×3): qty 0.3

## 2019-05-04 MED ORDER — HEPARIN SODIUM (PORCINE) 5000 UNIT/ML IJ SOLN: 5000.0000 [IU] | Freq: Three times a day (TID) | INTRAMUSCULAR | Status: DC

## 2019-05-04 MED ORDER — IPRATROPIUM-ALBUTEROL 20-100 MCG/ACT IN AERS
1.0000 | INHALATION_SPRAY | Freq: Four times a day (QID) | RESPIRATORY_TRACT | Status: DC
  Administered 2019-05-04 – 2019-05-17 (×33): 1 via RESPIRATORY_TRACT
  Filled 2019-05-04: qty 4

## 2019-05-04 NOTE — Progress Notes (Signed)
PROGRESS NOTE    Tiffany Turner  [id number] DOB: 08/03/1940 DOA: 05/03/2019 PCP: Patient, No Pcp Per   Brief Narrative:  Tiffany Turner is a 78 y.o. WF PMHx  advanced dementia, htn, CAD   presents with confusion over the last several days.  Pt with h/o freq utis.  Cannot provide any history due to her dementia.  All history obtained from records.  Found to be septic from uti with aki cr over 5 and covid positive.  Given vanc and azactam.    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 -Treat for 5-day course.  Acute renal failure? -Patient's last creatinine 05/01/2018   0.80, unsure of how much of this change is acute given patient's clinical picture, suspect chronic component. -Per admission note family does not desire HD. Recent Labs  Lab 05/02/19 0826 05/03/19 0440 05/04/19 0001 05/04/19 0600 05/04/19 1145  CREATININE 5.35* 3.66* 2.40* 2.20* 1.91*  -Avoid nephrotoxic medication  Severe metabolic derangement -Apparent patient has not been eating or drinking appropriately for at least weeks.  Will attempt to correct underlying metabolic abnormalities.  Hypernatremia/hyperchloremia  -Secondary to severe dehydration -D5W 13125ml/hr -Na goal no more than 152  Essential HTN -Stable off BP medication  Alzheimer's dementia -Unsure of baseline currently minimal response to painful stimuli  Acute metabolic encephalopathy -Multifactorial severe metabolic derangement secondary to severe dehydration and malnourishment, infection -Correct underlying causes    Bacterial Conjunctivitis -Erythromycin ointment x7 days   DVT prophylaxis: Lovenox Code Status: DNR Family Communication:  Disposition Plan: TBD   Consultants:    Procedures/Significant Events:     I have personally reviewed and interpreted all radiology studies and my findings are as above.  VENTILATOR SETTINGS:    Cultures 10/26 urine positive PROTEUS MIRABILIS  10/26 blood LEFT hand NGTD 10/26 blood RIGHT wrist NGTD   Antimicrobials: Anti-infectives (From admission, onward)   Start     Stop   05/04/19 1600  remdesivir 100 mg in sodium chloride 0.9 % 250 mL IVPB     05/08/19 1559   05/04/19 1300  fosfomycin (MONUROL) packet 3 g         05/04/19 1200  cefTRIAXone (ROCEPHIN) 1 g in sodium chloride 0.9 % 100 mL IVPB  Status:  Discontinued     05/04/19 1219   05/03/19 1600  ciprofloxacin (CIPRO) IVPB 200 mg  Status:  Discontinued     05/04/19 1148   05/03/19 1600  remdesivir 200 mg in sodium chloride 0.9 % 250 mL IVPB     05/03/19 1730   05/03/19 0600  aztreonam (AZACTAM) 0.5 g in dextrose 5 % 50 mL IVPB  Status:  Discontinued     05/03/19 0815   05/03/19 0048  vancomycin variable dose per unstable  renal function (pharmacist dosing)  Status:  Discontinued     05/03/19 0815       Devices    LINES / TUBES:      Continuous Infusions: . dextrose 125 mL/hr at 05/04/19 1009  . remdesivir 100 mg in NS 250 mL       Objective: Vitals:   05/04/19 0200 05/04/19 0400 05/04/19 0600 05/04/19 0800  BP: (!) 126/58 (!) 139/52 (!) 142/58 (!) 145/77  Pulse: (!) 59 74 78 73  Resp:  Temp:  97.9 F (36.6 C)  98.8 F (37.1 C)  TempSrc:  Axillary  Axillary  SpO2: 93% 92% 94% 93%  Weight:      Height:        Intake/Output Summary (Last 24 hours) at 05/04/2019 1603 Last data filed at 05/04/2019 0600 Gross per 24 hour  Intake 1644.18 ml  Output 400 ml  Net 1244.18 ml   Filed Weights   05/03/19 0015  Weight: 57.8 kg    Examination:  General: A/O x0, minimal response to painful stimuli, no acute respiratory distress, cachectic Eyes: negative scleral hemorrhage, negative anisocoria, negative icterus ENT: Negative Runny nose, negative gingival bleeding, Neck:  Negative scars, masses, torticollis, lymphadenopathy, JVD Lungs: Tachypneic, clear to auscultation bilaterally without wheezes or crackles Cardiovascular: Tachycardic, without murmur gallop or rub normal S1 and S2 Abdomen: negative abdominal pain, nondistended, positive soft, bowel sounds, no rebound, no ascites, no appreciable mass Extremities: No significant cyanosis, clubbing, or edema bilateral lower extremities Skin: Negative rashes, lesions, ulcers Psychiatric: Unable to evaluate secondary to altered mental status Central nervous system: Unable to evaluate secondary to altered mental status  .     Data Reviewed: Care during the described time interval was provided by me .  I have reviewed this patient's available data, including medical history, events of note, physical examination, and all test results as part of my evaluation.   CBC: Recent Labs  Lab 05/02/19 0826 05/03/19 0440 05/04/19 0600  WBC 30.3* 29.1* 21.3*  NEUTROABS 28.0*  --  19.3*  HGB 13.0 11.0* 10.1*  HCT 42.3 36.3 33.2*  MCV 107.6* 111.3* 109.9*  PLT 479* 333 347   Basic Metabolic Panel: Recent Labs  Lab 05/02/19 0826 05/03/19 0440 05/04/19 0001 05/04/19 0600 05/04/19 0625 05/04/19 1145  NA 168*  --  165* 168*  --  165*  K 4.9  --  4.4 4.9  --  4.5  CL >130*  --  >130* >130*  --  >130*  CO2 18*  --  20* 20*  --   20*  GLUCOSE 187*  --  137* 103*  --  120*  BUN 171*  --  145* 128*  --  119*  CREATININE 5.35* 3.66* 2.40* 2.20*  --  1.91*  CALCIUM 9.2  --  8.5* 8.8*  --  8.9  MG  --   --   --   --  2.4  --    GFR: Estimated Creatinine Clearance: 20.6 mL/min (A) (by C-G formula based on SCr of 1.91 mg/dL (H)). Liver Function Tests: Recent Labs  Lab 05/02/19 0826 05/04/19 0600  AST 113* 66*  ALT 73* 41  ALKPHOS 96 104  BILITOT 0.8 0.5  PROT 9.1* 6.8  ALBUMIN 2.9* 2.2*   No results for input(s): LIPASE, AMYLASE in the last 168 hours. No results for input(s): AMMONIA in the last 168 hours. Coagulation Profile: No results for input(s): INR, PROTIME in the last 168 hours. Cardiac  Enzymes: No results for input(s): CKTOTAL, CKMB, CKMBINDEX, TROPONINI in the last 168 hours. BNP (last 3 results) No results for input(s): PROBNP in the last 8760 hours. HbA1C: No results for input(s): HGBA1C in the last 72 hours. CBG: No results for input(s): GLUCAP in the last 168 hours. Lipid Profile: No results for input(s): CHOL, HDL, LDLCALC, TRIG, CHOLHDL, LDLDIRECT in the last 72 hours. Thyroid Function Tests: No results for input(s): TSH, T4TOTAL, FREET4, T3FREE, THYROIDAB in the last 72 hours. Anemia Panel: No results for input(s): VITAMINB12, FOLATE, FERRITIN, TIBC, IRON, RETICCTPCT in the last 72 hours. Urine analysis:    Component Value Date/Time   COLORURINE AMBER (A) 05/02/2019 0826   APPEARANCEUR TURBID (A) 05/02/2019 0826   APPEARANCEUR Turbid 07/29/2014 1548   LABSPEC 1.020 05/02/2019 0826   LABSPEC 1.018 07/29/2014 1548   PHURINE 7.0 05/02/2019 0826   GLUCOSEU NEGATIVE 05/02/2019 0826   GLUCOSEU Negative 07/29/2014 1548   HGBUR LARGE (A) 05/02/2019 0826   BILIRUBINUR NEGATIVE 05/02/2019 0826   KETONESUR NEGATIVE 05/02/2019 0826   PROTEINUR >=300 (A) 05/02/2019 0826   NITRITE NEGATIVE 05/02/2019 0826   LEUKOCYTESUR MODERATE (A) 05/02/2019 0826   LEUKOCYTESUR 3+ 07/29/2014 1548    Sepsis Labs: @LABRCNTIP (procalcitonin:4,lacticidven:4)  ) Recent Results (from the past 240 hour(s))  Urine culture     Status: Abnormal   Collection Time: 05/02/19  8:26 AM   Specimen: In/Out Cath Urine  Result Value Ref Range Status   Specimen Description   Final    IN/OUT CATH URINE Performed at [facility], [address] [place], [place] [address]    Special Requests   Final    NONE Performed at [facility], [address], [place], [place] [address]    Culture >=100,000 COLONIES/mL PROTEUS MIRABILIS (A)  Final   Report Status 05/04/2019 FINAL  Final   Organism ID, Bacteria PROTEUS MIRABILIS (A)  Final      Susceptibility   Proteus mirabilis - MIC*    AMPICILLIN >=32 RESISTANT Resistant     CEFAZOLIN <=4 SENSITIVE Sensitive     CEFTRIAXONE <=1 SENSITIVE Sensitive     CIPROFLOXACIN >=4 RESISTANT Resistant     GENTAMICIN <=1 SENSITIVE Sensitive     IMIPENEM 1 SENSITIVE Sensitive     NITROFURANTOIN 256 RESISTANT Resistant     TRIMETH/SULFA >=320 RESISTANT Resistant     AMPICILLIN/SULBACTAM <=2 SENSITIVE Sensitive     PIP/TAZO <=4 SENSITIVE Sensitive     * >=100,000 COLONIES/mL PROTEUS MIRABILIS  Blood culture (routine x 2)     Status: None (Preliminary result)   Collection Time: 05/02/19  8:35 AM   Specimen: BLOOD  Result Value Ref Range Status   Specimen Description BLOOD LEFT HAND  Final   Special Requests   Final    BOTTLES DRAWN AEROBIC AND ANAEROBIC Blood Culture adequate volume   Culture   Final    NO GROWTH 2 DAYS Performed at [facility], [address], [place], [place] [address]    Report Status PENDING  Incomplete  Blood culture (routine x 2)     Status: None (Preliminary result)   Collection Time: 05/02/19  8:35 AM   Specimen: BLOOD  Result Value Ref Range Status   Specimen Description BLOOD RIGHT WRIST  Final   Special Requests   Final    BOTTLES DRAWN AEROBIC AND ANAEROBIC Blood Culture results may not be optimal  due to an excessive volume of blood received in culture bottles   Culture   Final  NO GROWTH 2 DAYS Performed at [facility], [address] [place], [place] [address]    Report Status PENDING  Incomplete         Radiology Studies: No results found.      Scheduled Meds: . dexamethasone (DECADRON) injection  6 mg Intravenous Q24H  . erythromycin   Both Eyes Q6H  . fosfomycin  3 g Oral Once  . influenza vaccine adjuvanted  0.5 mL Intramuscular Tomorrow-1000  . Ipratropium-Albuterol  1 puff Inhalation QID  . mouth rinse  15 mL Mouth Rinse BID  . pneumococcal 23 valent vaccine  0.5 mL Intramuscular Tomorrow-1000  . vitamin C  500 mg Oral Daily  . zinc sulfate  220 mg Oral Daily   Continuous Infusions: . dextrose 125 mL/hr at 05/04/19 1009  . remdesivir 100 mg in NS 250 mL       LOS: 1 day   The patient is critically ill with multiple organ systems failure and requires high complexity decision making for assessment and support, frequent evaluation and titration of therapies, application of advanced monitoring technologies and extensive interpretation of multiple databases. Critical Care Time devoted to patient care services described in this note  Time spent: 40 minutes     Will Heinkel, Roselind Messier, MD [facility] Pager [phone]  If 7PM-7AM, please contact night-coverage [email or web address] Password [email or web address] 05/04/2019, 4:03 PM

## 2019-05-04 NOTE — Progress Notes (Signed)
CRITICAL VALUE STICKER  CRITICAL VALUE: sodium 161, chloride >130  RECEIVER (on-site recipient of call): Jonelle Sports, RN  [place] NOTIFIED: 10/28 1914  MESSENGER (representative from lab): Jay-lab  MD NOTIFIED: Olevia Bowens   TIME OF NOTIFICATION: 1924  RESPONSE:

## 2019-05-04 NOTE — Progress Notes (Signed)
CRITICAL VALUE ALERT  Critical Value:  Sodium 168; chloride > 130  Date & Time Notied:  05/04/19 0854  Provider Notified: C. Sherral Hammers MD  Orders Received/Actions taken: awaiting return page

## 2019-05-04 NOTE — Progress Notes (Signed)
CRITICAL VALUE ALERT  Critical Value:  Na 165; Cl >130   Date & Time Notied:  05/04/2019 1300  Provider Notified: Dr. Dia Crawford  Orders Received/Actions taken: No new orders at this time. Is better and do not want to correct too quickly.

## 2019-05-04 NOTE — Progress Notes (Signed)
Pt was started on aztreonam and vanc for her infection before it was changed to cipro for her UTI. She is completely obtunded and unclear of reaction to PCN. Called [facility] in [place]. Nurse there stated that she has gotten ceftriaxone without any issue there. The risk of crossreactivity of ceftriaxone to PCN is <1%. D/w Dr Sherral Hammers and we will change the fosfomycin to ceftriaxone 1g IV q24  Onnie Boer, PharmD, [place], AAHIVP, CPP Infectious Disease Pharmacist 05/04/2019 4:11 PM

## 2019-05-04 NOTE — Progress Notes (Signed)
Son called to hospital and spoke with him.  Update given.

## 2019-05-04 NOTE — Progress Notes (Signed)
ANTICOAGULATION CONSULT NOTE - Initial Consult  Pharmacy Consult for heparin Indication: VTE prophylaxis  Allergies  Allergen Reactions  . Penicillins Other (See Comments)    Has patient had a PCN reaction causing immediate rash, facial/tongue/throat swelling, SOB or lightheadedness with hypotension: Unknown Has patient had a PCN reaction causing severe rash involving mucus membranes or skin necrosis: Unknown Has patient had a PCN reaction that required hospitalization: Unknown Has patient had a PCN reaction occurring within the last 10 years: Unknown If all of the above answers are "NO", then may proceed with Cephalosporin use.  . Sulfa Antibiotics Other (See Comments)    unknown    Patient Measurements: Height: 5\' 4"  (162.6 cm) Weight: 127 lb 6.4 oz (57.8 kg) IBW/kg (Calculated) : 54.7  Vital Signs: Temp: 98.8 F (37.1 C) (10/28 0800) Temp Source: Axillary (10/28 0800) BP: 145/77 (10/28 0800) Pulse Rate: 73 (10/28 0800)  Labs: Recent Labs    05/02/19 0826 05/03/19 0440 05/04/19 0001 05/04/19 0600 05/04/19 1145  HGB 13.0 11.0*  --  10.1*  --   HCT 42.3 36.3  --  33.2*  --   PLT 479* 333  --  347  --   CREATININE 5.35* 3.66* 2.40* 2.20* 1.91*    Estimated Creatinine Clearance: 20.6 mL/min (A) (by C-G formula based on SCr of 1.91 mg/dL (H)).   Medical History: Past Medical History:  Diagnosis Date  . Coronary artery disease    stents placed  . Dementia ([place])   . Hip dislocation, right (HCC)    hx of dislocation  . Hyperlipemia   . Hypertension     Assessment: 74 YOF with COVID-19 pneumonia to start SQ Lovenox for VTE prophylaxis.  CrCl < 30 ml/min. H/H trending down, Plt wnl. D-dimer 3.09  Goal of Therapy:  VTE prophylaxis Monitor platelets by anticoagulation protocol: Yes   Plan:  -Lovenox 30 mg SQ daily  -Monitor renal fx and s/s of bleeding   Albertina Parr, PharmD., BCPS Clinical Pharmacist Clinical phone for 05/04/19 until 5pm:  [phone]

## 2019-05-04 NOTE — Progress Notes (Signed)
CRITICAL VALUE STICKER  CRITICAL VALUE:  Sodium 165, Chloride 130  RECEIVER (on-site recipient of call): Jonelle Sports, RN  DATE & TIME NOTIFIED:  05/04/19 218 am  MESSENGER (representative from lab): Conley Rolls  MD NOTIFIED: Dr. Shanon Brow  TIME OF NOTIFICATION: 245 am  RESPONSE:

## 2019-05-05 ENCOUNTER — Inpatient Hospital Stay (HOSPITAL_COMMUNITY): Payer: Medicare Other

## 2019-05-05 DIAGNOSIS — G308 Other Alzheimer's disease: Secondary | ICD-10-CM

## 2019-05-05 DIAGNOSIS — L89002 Pressure ulcer of unspecified elbow, stage 2: Secondary | ICD-10-CM

## 2019-05-05 LAB — CBC WITH DIFFERENTIAL/PLATELET
Abs Immature Granulocytes: 0.31 10*3/uL — ABNORMAL HIGH (ref 0.00–0.07)
Basophils Absolute: 0.1 10*3/uL (ref 0.0–0.1)
Basophils Relative: 0 %
Eosinophils Absolute: 0 10*3/uL (ref 0.0–0.5)
Eosinophils Relative: 0 %
HCT: 30.6 % — ABNORMAL LOW (ref 36.0–46.0)
Hemoglobin: 9.5 g/dL — ABNORMAL LOW (ref 12.0–15.0)
Immature Granulocytes: 2 %
Lymphocytes Relative: 4 %
Lymphs Abs: 0.7 10*3/uL (ref 0.7–4.0)
MCH: 33.3 pg (ref 26.0–34.0)
MCHC: 31 g/dL (ref 30.0–36.0)
MCV: 107.4 fL — ABNORMAL HIGH (ref 80.0–100.0)
Monocytes Absolute: 0.5 10*3/uL (ref 0.1–1.0)
Monocytes Relative: 3 %
Neutro Abs: 16.8 10*3/uL — ABNORMAL HIGH (ref 1.7–7.7)
Neutrophils Relative %: 91 %
Platelets: 341 10*3/uL (ref 150–400)
RBC: 2.85 MIL/uL — ABNORMAL LOW (ref 3.87–5.11)
RDW: 14 % (ref 11.5–15.5)
WBC: 18.3 10*3/uL — ABNORMAL HIGH (ref 4.0–10.5)
nRBC: 0.1 % (ref 0.0–0.2)

## 2019-05-05 LAB — LACTATE DEHYDROGENASE: LDH: 395 U/L — ABNORMAL HIGH (ref 98–192)

## 2019-05-05 LAB — COMPREHENSIVE METABOLIC PANEL
ALT: 38 U/L (ref 0–44)
AST: 73 U/L — ABNORMAL HIGH (ref 15–41)
Albumin: 2.1 g/dL — ABNORMAL LOW (ref 3.5–5.0)
Alkaline Phosphatase: 87 U/L (ref 38–126)
Anion gap: 10 (ref 5–15)
BUN: 100 mg/dL — ABNORMAL HIGH (ref 8–23)
CO2: 18 mmol/L — ABNORMAL LOW (ref 22–32)
Calcium: 8.4 mg/dL — ABNORMAL LOW (ref 8.9–10.3)
Chloride: 129 mmol/L — ABNORMAL HIGH (ref 98–111)
Creatinine, Ser: 1.68 mg/dL — ABNORMAL HIGH (ref 0.44–1.00)
GFR calc Af Amer: 33 mL/min — ABNORMAL LOW (ref >60)
GFR calc non Af Amer: 29 mL/min — ABNORMAL LOW (ref >60)
Glucose, Bld: 272 mg/dL — ABNORMAL HIGH (ref 70–99)
Potassium: 4.1 mmol/L (ref 3.5–5.1)
Sodium: 157 mmol/L — ABNORMAL HIGH (ref 135–145)
Total Bilirubin: 0.8 mg/dL (ref 0.3–1.2)
Total Protein: 6.5 g/dL (ref 6.5–8.1)

## 2019-05-05 LAB — C-REACTIVE PROTEIN: CRP: 3.2 mg/dL — ABNORMAL HIGH (ref <1.0)

## 2019-05-05 LAB — D-DIMER, QUANTITATIVE: D-Dimer, Quant: 3 ug/mL-FEU — ABNORMAL HIGH (ref 0.00–0.50)

## 2019-05-05 LAB — FERRITIN: Ferritin: 867 ng/mL — ABNORMAL HIGH (ref 11–307)

## 2019-05-05 LAB — PHOSPHORUS: Phosphorus: 2.3 mg/dL — ABNORMAL LOW (ref 2.5–4.6)

## 2019-05-05 LAB — MAGNESIUM: Magnesium: 2.1 mg/dL (ref 1.7–2.4)

## 2019-05-05 LAB — MRSA PCR SCREENING: MRSA by PCR: NEGATIVE

## 2019-05-05 NOTE — Progress Notes (Signed)
   Vital Signs MEWS/VS Documentation      05/05/2019 0910 05/05/2019 1400 05/05/2019 1600 05/05/2019 1919   MEWS Score:  2  2  3  2    MEWS Score Color:  Yellow  Yellow  Yellow  Yellow   Resp:  -  -  (!) 22  18   Pulse:  -  61  (!) 48  75   BP:  -  -  132/60  (!) 154/75   Temp:  -  -  (!) 97.4 F (36.3 C)  98.4 F (36.9 C)   O2 Device:  -  Room Air  Room Air  Room Air   Level of Consciousness:  Responds to Pain  -  -  Responds to Pain       MEWS - yellow because of level of consciousness. Currently lethargic but responds to pain which is not an acute change and is an improvement from admission status. Other vitals remain stable.    Janziel Hockett 05/05/2019,9:01 PM

## 2019-05-05 NOTE — Progress Notes (Signed)
Brief Pharmacy Note  Patient is a 78 y/o F who presented to [facility] ED on 10/26 c/o AMS and recent COVID-19 positive test. Patient was subsequently transferred to [place] where she remains admitted at this time. She has been started on ceftriaxone for UTI. Urine culture result from ED visit has resulted >100k colonies/mL Proteus mirabilis (ceftriaxone sensitive). As patient is appropriately being treated for UTI will not pursue further intervention at this time. Will relay results of culture result to ID pharmacist at [place].   [place] Resident 05 May 2019

## 2019-05-05 NOTE — Progress Notes (Signed)
PROGRESS NOTE    Tiffany Turner  ZOX:[id number] DOB: 09-26-1940 DOA: 05/03/2019 PCP: Patient, No Pcp Per   Brief Narrative:  Tiffany Turner is a 78 y.o. WF PMHx  advanced dementia, htn, CAD   presents with confusion over the last several days.  Pt with h/o freq utis.  Cannot provide any history due to her dementia.  All history obtained from records.  Found to be septic from uti with aki cr over 5 and covid positive.  Given vanc and azactam.    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)   Acute renal failure (HCC)   Acute lower UTI   Dehydration with hypernatremia   Acute metabolic encephalopathy   COVID-19 virus infection   Pressure injury of skin   Hypernatremia   Hyperchloremia   Bacterial conjunctivitis   Alzheimer's dementia (HCC)  Covid 19 virus infection -Negative respiratory symptoms  -Placed on continuous O2 monitoring -10/29 PCXR bibasilar opacification see results below -Decadron 6 mg daily -Remdesivir per pharmacy protocol -Titrate O2 to maintain SPO2> 88% -Combivent QID -Flutter valve -Vitamins per Covid protocol COVID-19 Labs  Recent Labs    05/04/19 0600 05/05/19 0000  DDIMER 3.09* 3.00*  FERRITIN  --  867*  LDH  --  395*  CRP 4.0* 3.2*    No results found for: SARSCOV2NAA  Proteus Mirabilis UTI -On admission patient does not meet criteria for sepsis. -Treat for 5-day course.  Acute renal failure? -Patient's last creatinine 05/01/2018   0.80, unsure of how much of this change is acute given patient's clinical picture, suspect chronic component. -Per admission note family does not desire HD. Recent Labs  Lab 05/04/19 0001 05/04/19 0600 05/04/19 1145 05/04/19 1805 05/05/19 0000  CREATININE 2.40* 2.20* 1.91* 1.78* 1.68*   -Avoid nephrotoxic medication  Severe metabolic derangement -Apparent patient has not been eating or drinking appropriately for at least weeks.  Will attempt to correct underlying metabolic abnormalities.  Hypernatremia/hyperchloremia -Secondary to severe dehydration -D5W 122ml/hr -Na goal no more than 152  Essential HTN -Stable off BP medication  Alzheimer's dementia -Unsure of baseline currently.  Increase response to painful stimuli after 24 hours of hydration.    Acute metabolic encephalopathy -Multifactorial severe metabolic derangement secondary to severe dehydration and malnourishment, infection -Correct underlying causes    Bacterial Conjunctivitis -Erythromycin ointment x7 days   DVT prophylaxis: Lovenox Code Status: DNR Family Communication: 10/29 attempted to call [name] (son) mailbox not set up unable to leave message Disposition Plan: TBD   Consultants:    Procedures/Significant Events:  PCXR 10/29;-Stable bibasilar opacities are noted concerning for pneumonia or atelectasis.    I have personally reviewed and interpreted all radiology studies and my findings are as above.  VENTILATOR SETTINGS:    Cultures 10/26 urine positive PROTEUS MIRABILIS  10/26 blood LEFT hand NGTD 10/26 blood RIGHT wrist NGTD   Antimicrobials: Anti-infectives (From admission, onward)   Start     Stop   05/04/19 1600  remdesivir 100 mg in sodium chloride 0.9 % 250 mL IVPB     05/08/19 1559   05/04/19 1300  fosfomycin (MONUROL) packet 3 g         05/04/19 1200  cefTRIAXone (ROCEPHIN) 1 g in sodium chloride 0.9 % 100 mL IVPB  Status:  Discontinued  05/04/19 1219   05/03/19 1600  ciprofloxacin (CIPRO) IVPB 200 mg  Status:  Discontinued     05/04/19 1148   05/03/19 1600  remdesivir 200 mg in sodium chloride 0.9 % 250 mL IVPB     05/03/19 1730   05/03/19 0600  aztreonam (AZACTAM) 0.5 g in dextrose 5 % 50 mL IVPB  Status:  Discontinued     05/03/19 0815   05/03/19 0048   vancomycin variable dose per unstable renal function (pharmacist dosing)  Status:  Discontinued     05/03/19 0815       Devices    LINES / TUBES:      Continuous Infusions: . cefTRIAXone (ROCEPHIN)  IV Stopped (05/04/19 1800)  . dextrose 125 mL/hr at 05/05/19 0431  . remdesivir 100 mg in NS 250 mL Stopped (05/04/19 1842)     Objective: Vitals:   05/04/19 1706 05/04/19 2000 05/05/19 0000 05/05/19 0400  BP: (!) 158/76 (!) 140/49 (!) 145/61 (!) 142/73  Pulse: 80 (!) 52 61 (!) 58  Resp: 14 18 (!) 21 20  Temp: 98.9 F (37.2 C) 98.5 F (36.9 C) 98.5 F (36.9 C) 97.6 F (36.4 C)  TempSrc: Axillary Axillary Oral Axillary  SpO2: 91% 94% 93% 94%  Weight:      Height:        Intake/Output Summary (Last 24 hours) at 05/05/2019 0807 Last data filed at 05/05/2019 0600 Gross per 24 hour  Intake 2536.19 ml  Output 1250 ml  Net 1286.19 ml   Filed Weights   05/03/19 0015  Weight: 57.8 kg   Physical Exam:  General: A/O x0, minimal response to painful stimuli, positive acute respiratory distress, cachectic Eyes: negative scleral hemorrhage, negative anisocoria, negative icterus, purulent discharge bilateral eyes RIGHT>> LEFT ENT: Negative Runny nose, negative gingival bleeding, Neck:  Negative scars, masses, torticollis, lymphadenopathy, JVD Lungs: Clear to auscultation bilaterally without wheezes or crackles Cardiovascular: Regular rate and rhythm without murmur gallop or rub normal S1 and S2 Abdomen: negative abdominal pain, nondistended, positive soft, bowel sounds, no rebound, no ascites, no appreciable mass Extremities: No significant cyanosis, clubbing, or edema bilateral lower extremities Skin: Negative rashes, lesions, ulcers Psychiatric: Unable to evaluate AMS Central nervous system: Minimal withdrawal to painful stimuli   .     Data Reviewed: Care during the described time interval was provided by me .  I have reviewed this patient's available data, including  medical history, events of note, physical examination, and all test results as part of my evaluation.   CBC: Recent Labs  Lab 05/02/19 0826 05/03/19 0440 05/04/19 0600 05/05/19 0000  WBC 30.3* 29.1* 21.3* 18.3*  NEUTROABS 28.0*  --  19.3* 16.8*  HGB 13.0 11.0* 10.1* 9.5*  HCT 42.3 36.3 33.2* 30.6*  MCV 107.6* 111.3* 109.9* 107.4*  PLT 479* 333 347 341   Basic Metabolic Panel: Recent Labs  Lab 05/04/19 0001 05/04/19 0600 05/04/19 0625 05/04/19 1145 05/04/19 1805 05/05/19 0000  NA 165* 168*  --  165* 161* 157*  K 4.4 4.9  --  4.5 4.4 4.1  CL >130* >130*  --  >130* >130* 129*  CO2 20* 20*  --  20* 20* 18*  GLUCOSE 137* 103*  --  120* 223* 272*  BUN 145* 128*  --  119* 113* 100*  CREATININE 2.40* 2.20*  --  1.91* 1.78* 1.68*  CALCIUM 8.5* 8.8*  --  8.9 8.5* 8.4*  MG  --   --  2.4  --   --  2.1  PHOS  --   --   --   --   --  2.3*   GFR: Estimated Creatinine Clearance: 23.4 mL/min (A) (by C-G formula based on SCr of 1.68 mg/dL (H)). Liver Function Tests: Recent Labs  Lab 05/02/19 0826 05/04/19 0600 05/05/19 0000  AST 113* 66* 73*  ALT 73* 41 38  ALKPHOS 96 104 87  BILITOT 0.8 0.5 0.8  PROT 9.1* 6.8 6.5  ALBUMIN 2.9* 2.2* 2.1*   No results for input(s): LIPASE, AMYLASE in the last 168 hours. No results for input(s): AMMONIA in the last 168 hours. Coagulation Profile: No results for input(s): INR, PROTIME in the last 168 hours. Cardiac Enzymes: No results for input(s): CKTOTAL, CKMB, CKMBINDEX, TROPONINI in the last 168 hours. BNP (last 3 results) No results for input(s): PROBNP in the last 8760 hours. HbA1C: No results for input(s): HGBA1C in the last 72 hours. CBG: No results for input(s): GLUCAP in the last 168 hours. Lipid Profile: No results for input(s): CHOL, HDL, LDLCALC, TRIG, CHOLHDL, LDLDIRECT in the last 72 hours. Thyroid Function Tests: No results for input(s): TSH, T4TOTAL, FREET4, T3FREE, THYROIDAB in the last 72 hours. Anemia Panel: Recent  Labs    05/05/19 0000  FERRITIN 867*   Urine analysis:    Component Value Date/Time   COLORURINE AMBER (A) 05/02/2019 0826   APPEARANCEUR TURBID (A) 05/02/2019 0826   APPEARANCEUR Turbid 07/29/2014 1548   LABSPEC 1.020 05/02/2019 0826   LABSPEC 1.018 07/29/2014 1548   PHURINE 7.0 05/02/2019 0826   GLUCOSEU NEGATIVE 05/02/2019 0826   GLUCOSEU Negative 07/29/2014 1548   HGBUR LARGE (A) 05/02/2019 0826   BILIRUBINUR NEGATIVE 05/02/2019 0826   KETONESUR NEGATIVE 05/02/2019 0826   PROTEINUR >=300 (A) 05/02/2019 0826   NITRITE NEGATIVE 05/02/2019 0826   LEUKOCYTESUR MODERATE (A) 05/02/2019 0826   LEUKOCYTESUR 3+ 07/29/2014 1548   Sepsis Labs: @LABRCNTIP (procalcitonin:4,lacticidven:4)  ) Recent Results (from the past 240 hour(s))  Urine culture     Status: Abnormal   Collection Time: 05/02/19  8:26 AM   Specimen: In/Out Cath Urine  Result Value Ref Range Status   Specimen Description   Final    IN/OUT CATH URINE Performed at [facility], [address] [place], [place] [address]    Special Requests   Final    NONE Performed at [facility], [address] [place], [place] [address]    Culture >=100,000 COLONIES/mL PROTEUS MIRABILIS (A)  Final   Report Status 05/04/2019 FINAL  Final   Organism ID, Bacteria PROTEUS MIRABILIS (A)  Final      Susceptibility   Proteus mirabilis - MIC*    AMPICILLIN >=32 RESISTANT Resistant     CEFAZOLIN <=4 SENSITIVE Sensitive     CEFTRIAXONE <=1 SENSITIVE Sensitive     CIPROFLOXACIN >=4 RESISTANT Resistant     GENTAMICIN <=1 SENSITIVE Sensitive     IMIPENEM 1 SENSITIVE Sensitive     NITROFURANTOIN 256 RESISTANT Resistant     TRIMETH/SULFA >=320 RESISTANT Resistant     AMPICILLIN/SULBACTAM <=2 SENSITIVE Sensitive     PIP/TAZO <=4 SENSITIVE Sensitive     * >=100,000 COLONIES/mL PROTEUS MIRABILIS  Blood culture (routine x 2)     Status: None (Preliminary result)   Collection Time: 05/02/19  8:35 AM   Specimen: BLOOD   Result Value Ref Range Status   Specimen Description BLOOD LEFT HAND  Final   Special Requests   Final    BOTTLES DRAWN AEROBIC AND ANAEROBIC Blood Culture  adequate volume   Culture   Final    NO GROWTH 3 DAYS Performed at [facility], [address] [place], [place] [address]    Report Status PENDING  Incomplete  Blood culture (routine x 2)     Status: None (Preliminary result)   Collection Time: 05/02/19  8:35 AM   Specimen: BLOOD  Result Value Ref Range Status   Specimen Description BLOOD RIGHT WRIST  Final   Special Requests   Final    BOTTLES DRAWN AEROBIC AND ANAEROBIC Blood Culture results may not be optimal due to an excessive volume of blood received in culture bottles   Culture   Final    NO GROWTH 3 DAYS Performed at [facility], [address] [place], [place] [address]    Report Status PENDING  Incomplete         Radiology Studies: No results found.      Scheduled Meds: . dexamethasone (DECADRON) injection  6 mg Intravenous Q24H  . enoxaparin (LOVENOX) injection  30 mg Subcutaneous Q24H  . erythromycin   Both Eyes Q6H  . influenza vaccine adjuvanted  0.5 mL Intramuscular Tomorrow-1000  . Ipratropium-Albuterol  1 puff Inhalation QID  . mouth rinse  15 mL Mouth Rinse BID  . pneumococcal 23 valent vaccine  0.5 mL Intramuscular Tomorrow-1000  . vitamin C  500 mg Oral Daily  . zinc sulfate  220 mg Oral Daily   Continuous Infusions: . cefTRIAXone (ROCEPHIN)  IV Stopped (05/04/19 1800)  . dextrose 125 mL/hr at 05/05/19 0431  . remdesivir 100 mg in NS 250 mL Stopped (05/04/19 1842)     LOS: 2 days   The patient is critically ill with multiple organ systems failure and requires high complexity decision making for assessment and support, frequent evaluation and titration of therapies, application of advanced monitoring technologies and extensive interpretation of multiple databases. Critical Care Time devoted to patient care services  described in this note  Time spent: 40 minutes     Uzair Godley, Roselind MessierURTIS J, MD [facility] Pager [phone]  If 7PM-7AM, please contact night-coverage [email or web address] Password [email or web address] 05/05/2019, 8:07 AM

## 2019-05-05 NOTE — Progress Notes (Signed)
Attempted to call son, Gerald Stabs, to give update but got no answer and voicemail not set up.

## 2019-05-06 ENCOUNTER — Inpatient Hospital Stay (HOSPITAL_COMMUNITY): Payer: Medicare Other

## 2019-05-06 DIAGNOSIS — E440 Moderate protein-calorie malnutrition: Secondary | ICD-10-CM | POA: Diagnosis present

## 2019-05-06 LAB — CBC WITH DIFFERENTIAL/PLATELET
Abs Immature Granulocytes: 0.39 10*3/uL — ABNORMAL HIGH (ref 0.00–0.07)
Basophils Absolute: 0.1 10*3/uL (ref 0.0–0.1)
Basophils Relative: 0 %
Eosinophils Absolute: 0 10*3/uL (ref 0.0–0.5)
Eosinophils Relative: 0 %
HCT: 30.5 % — ABNORMAL LOW (ref 36.0–46.0)
Hemoglobin: 9.7 g/dL — ABNORMAL LOW (ref 12.0–15.0)
Immature Granulocytes: 3 %
Lymphocytes Relative: 9 %
Lymphs Abs: 1.3 10*3/uL (ref 0.7–4.0)
MCH: 33.1 pg (ref 26.0–34.0)
MCHC: 31.8 g/dL (ref 30.0–36.0)
MCV: 104.1 fL — ABNORMAL HIGH (ref 80.0–100.0)
Monocytes Absolute: 0.5 10*3/uL (ref 0.1–1.0)
Monocytes Relative: 3 %
Neutro Abs: 13.4 10*3/uL — ABNORMAL HIGH (ref 1.7–7.7)
Neutrophils Relative %: 85 %
Platelets: 327 10*3/uL (ref 150–400)
RBC: 2.93 MIL/uL — ABNORMAL LOW (ref 3.87–5.11)
RDW: 13.1 % (ref 11.5–15.5)
WBC: 15.7 10*3/uL — ABNORMAL HIGH (ref 4.0–10.5)
nRBC: 0 % (ref 0.0–0.2)

## 2019-05-06 LAB — COMPREHENSIVE METABOLIC PANEL
ALT: 40 U/L (ref 0–44)
AST: 77 U/L — ABNORMAL HIGH (ref 15–41)
Albumin: 1.9 g/dL — ABNORMAL LOW (ref 3.5–5.0)
Alkaline Phosphatase: 82 U/L (ref 38–126)
Anion gap: 10 (ref 5–15)
BUN: 66 mg/dL — ABNORMAL HIGH (ref 8–23)
CO2: 18 mmol/L — ABNORMAL LOW (ref 22–32)
Calcium: 7.7 mg/dL — ABNORMAL LOW (ref 8.9–10.3)
Chloride: 116 mmol/L — ABNORMAL HIGH (ref 98–111)
Creatinine, Ser: 1.16 mg/dL — ABNORMAL HIGH (ref 0.44–1.00)
GFR calc Af Amer: 52 mL/min — ABNORMAL LOW (ref >60)
GFR calc non Af Amer: 45 mL/min — ABNORMAL LOW (ref >60)
Glucose, Bld: 232 mg/dL — ABNORMAL HIGH (ref 70–99)
Potassium: 3.2 mmol/L — ABNORMAL LOW (ref 3.5–5.1)
Sodium: 144 mmol/L (ref 135–145)
Total Bilirubin: 0.4 mg/dL (ref 0.3–1.2)
Total Protein: 6.2 g/dL — ABNORMAL LOW (ref 6.5–8.1)

## 2019-05-06 LAB — D-DIMER, QUANTITATIVE: D-Dimer, Quant: 2.55 ug/mL-FEU — ABNORMAL HIGH (ref 0.00–0.50)

## 2019-05-06 LAB — FERRITIN: Ferritin: 879 ng/mL — ABNORMAL HIGH (ref 11–307)

## 2019-05-06 LAB — MAGNESIUM: Magnesium: 1.4 mg/dL — ABNORMAL LOW (ref 1.7–2.4)

## 2019-05-06 LAB — C-REACTIVE PROTEIN: CRP: 2.5 mg/dL — ABNORMAL HIGH (ref <1.0)

## 2019-05-06 LAB — SARS CORONAVIRUS 2 (TAT 6-24 HRS): SARS Coronavirus 2: POSITIVE — AB

## 2019-05-06 LAB — PHOSPHORUS: Phosphorus: 2.5 mg/dL (ref 2.5–4.6)

## 2019-05-06 MED ORDER — POTASSIUM CHLORIDE 10 MEQ/100ML IV SOLN
10.0000 meq | INTRAVENOUS | Status: AC
  Administered 2019-05-06 (×5): 10 meq via INTRAVENOUS
  Filled 2019-05-06 (×5): qty 100

## 2019-05-06 MED ORDER — MAGNESIUM SULFATE 2 GM/50ML IV SOLN
2.0000 g | Freq: Once | INTRAVENOUS | Status: AC
  Administered 2019-05-06: 2 g via INTRAVENOUS
  Filled 2019-05-06: qty 50

## 2019-05-06 NOTE — Progress Notes (Signed)
PROGRESS NOTE    [name]  ZOX:[id number] DOB: 1941/02/01 DOA: 05/03/2019 PCP: Patient, No Pcp Per   Brief Narrative:  Tiffany Turner is a 78 y.o. WF PMHx  advanced dementia, htn, CAD   presents with confusion over the last several days.  Pt with h/o freq utis.  Cannot provide any history due to her dementia.  All history obtained from records.  Found to be septic from uti with aki cr over 5 and covid positive.  Given vanc and azactam.    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)   Acute renal failure (HCC)   Acute lower UTI   Dehydration with hypernatremia   Acute metabolic encephalopathy   COVID-19 virus infection   Pressure injury of skin   Hypernatremia   Hyperchloremia   Bacterial conjunctivitis   Alzheimer's dementia (HCC)  Covid 19 virus infection -Negative respiratory symptoms  -Placed on continuous O2 monitoring -10/29 PCXR bibasilar opacification see results below -Decadron 6 mg daily -Remdesivir per pharmacy protocol -Titrate O2 to maintain SPO2> 88% -Combivent QID -Flutter valve -Vitamins per Covid protocol COVID-19 Labs  Recent Labs    05/04/19 0600 05/05/19 0000 05/06/19 0039  DDIMER 3.09* 3.00* 2.55*  FERRITIN  --  867* 879*  LDH  --  395*  --   CRP 4.0* 3.2* 2.5*  -10/29 SARS coronavirus pending  Proteus Mirabilis UTI -On admission patient does not meet criteria for sepsis. -Treat for 5-day course.  Acute renal failure? -Patient's last creatinine 05/01/2018   0.80, unsure of how much of this change is acute given patient's clinical picture, suspect chronic component. -Per admission note family does not desire HD. Recent  Labs  Lab 05/04/19 0600 05/04/19 1145 05/04/19 1805 05/05/19 0000 05/06/19 0039  CREATININE 2.20* 1.91* 1.78* 1.68* 1.16*  -Avoid nephrotoxic medication -Creatinine improving with hydration.  Severe metabolic derangement -Apparent patient has not been eating or drinking appropriately for at least weeks.  Will attempt to correct underlying metabolic abnormalities.  Hypernatremia/hyperchloremia -Secondary to severe dehydration -10/30 decreased D5W 75 ml/hr  Essential HTN -Stable off BP medication  Alzheimer's dementia -Unsure of baseline currently.  Increase response to painful stimuli after 24 hours of hydration.    Acute metabolic encephalopathy -Multifactorial severe metabolic derangement secondary to severe dehydration and malnourishment, infection -Correct underlying causes    Bacterial Conjunctivitis -Erythromycin ointment x7 days  Hypokalemia -Potassium goal> 4 -Potassium IV 50 mEq  Hypomagnesmia -Magnesium goal> 2 -Magnesium IV 2 g  Protein calorie malnutrition moderate -I have been attempting to contact son since admission unsuccessfully will insert NG tube, medical necessity in order to provide nutrition for patient. -Consult nutrition for enteral feed.     DVT prophylaxis: Lovenox Code Status: DNR Family Communication: 10/30 attempted to call [name] (son) mailbox not set up unable to leave message Disposition Plan: TBD   Consultants:    Procedures/Significant Events:  PCXR 10/29;-Stable bibasilar opacities are noted concerning for pneumonia or atelectasis.    I have personally reviewed and interpreted all radiology studies and my findings are as above.  VENTILATOR SETTINGS:    Cultures 10/26 urine positive PROTEUS MIRABILIS  10/26 blood LEFT hand NGTD 10/26 blood RIGHT wrist NGTD 10/29 SARS coronavirus pending  Antimicrobials: Anti-infectives (From admission, onward)   Start     Stop   05/04/19 1600  remdesivir 100 mg in sodium  chloride 0.9 % 250 mL IVPB     05/08/19 1559   05/04/19 1300  fosfomycin (MONUROL) packet 3 g         05/04/19 1200  cefTRIAXone (ROCEPHIN) 1 g in sodium chloride 0.9 % 100 mL IVPB  Status:  Discontinued     05/04/19 1219   05/03/19 1600  ciprofloxacin (CIPRO) IVPB 200 mg  Status:  Discontinued     05/04/19 1148   05/03/19 1600  remdesivir 200 mg in sodium chloride 0.9 % 250 mL IVPB     05/03/19 1730   05/03/19 0600  aztreonam (AZACTAM) 0.5 g in dextrose 5 % 50 mL IVPB  Status:  Discontinued     05/03/19 0815   05/03/19 0048  vancomycin variable dose per unstable renal function (pharmacist dosing)  Status:  Discontinued     05/03/19 0815       Devices    LINES / TUBES:      Continuous Infusions: . cefTRIAXone (ROCEPHIN)  IV Stopped (05/05/19 1845)  . dextrose 125 mL/hr at 05/06/19 0415  . remdesivir 100 mg in NS 250 mL Stopped (05/05/19 1637)     Objective: Vitals:   05/05/19 1919 05/05/19 2322 05/06/19 0411 05/06/19 0932  BP: (!) 154/75 122/68 (!) 143/63 (!) 105/56  Pulse: 75 67 76 77  Resp: 18 20 17 20   Temp: 98.4 F (36.9 C) 98.2 F (36.8 C) 98.7 F (37.1 C) 98.7 F (37.1 C)  TempSrc: Axillary Axillary Oral Axillary  SpO2: 95% 93% 95% 92%  Weight:      Height:        Intake/Output Summary (Last 24 hours) at 05/06/2019 1023 Last data filed at 05/06/2019 0400 Gross per 24 hour  Intake 2960.17 ml  Output 1900 ml  Net 1060.17 ml   Filed Weights   05/03/19 0015  Weight: 57.8 kg   Physical Exam:  General: A/O x0, somewhat more responsive today to painful stimuli, opens her eyes slightly to painful stimuli and calling of her name, positive acute respiratory distress cachectic Eyes: negative scleral hemorrhage, negative anisocoria, negative icterus, continued purulent discharge bilateral eyes RIGHT>> LEFT ENT: Negative Runny nose, negative gingival bleeding, Neck:  Negative scars, masses, torticollis, lymphadenopathy, JVD Lungs: Clear to auscultation  bilaterally without wheezes or crackles Cardiovascular: Regular rate and rhythm without murmur gallop or rub normal S1 and S2 Abdomen: negative abdominal pain, nondistended, positive soft, bowel sounds, no rebound, no ascites, no appreciable mass Extremities: No significant cyanosis, clubbing, or edema bilateral lower extremities Skin: Negative rashes, lesions, ulcers Psychiatric: Unable to assess, altered mental status Central nervous system: Minimal withdrawal to painful stimuli   .     Data Reviewed: Care during the described time interval was provided by me .  I have reviewed this patient's available data, including medical history, events of note, physical examination, and all test results as part of my evaluation.   CBC: Recent Labs  Lab 05/02/19 0826 05/03/19 0440 05/04/19 0600 05/05/19 0000 05/06/19 0039  WBC 30.3* 29.1* 21.3* 18.3* 15.7*  NEUTROABS 28.0*  --  19.3* 16.8* 13.4*  HGB 13.0 11.0* 10.1* 9.5* 9.7*  HCT 42.3 36.3 33.2* 30.6* 30.5*  MCV 107.6* 111.3* 109.9* 107.4* 104.1*  PLT 479* 333 347 341 327   Basic Metabolic Panel: Recent Labs  Lab 05/04/19 0600 05/04/19 0625 05/04/19 1145 05/04/19 1805 05/05/19 0000  05/06/19 0039  NA 168*  --  165* 161* 157* 144  K 4.9  --  4.5 4.4 4.1 3.2*  CL >130*  --  >130* >130* 129* 116*  CO2 20*  --  20* 20* 18* 18*  GLUCOSE 103*  --  120* 223* 272* 232*  BUN 128*  --  119* 113* 100* 66*  CREATININE 2.20*  --  1.91* 1.78* 1.68* 1.16*  CALCIUM 8.8*  --  8.9 8.5* 8.4* 7.7*  MG  --  2.4  --   --  2.1 1.4*  PHOS  --   --   --   --  2.3* 2.5   GFR: Estimated Creatinine Clearance: 34 mL/min (A) (by C-G formula based on SCr of 1.16 mg/dL (H)). Liver Function Tests: Recent Labs  Lab 05/02/19 0826 05/04/19 0600 05/05/19 0000 05/06/19 0039  AST 113* 66* 73* 77*  ALT 73* 41 38 40  ALKPHOS 96 104 87 82  BILITOT 0.8 0.5 0.8 0.4  PROT 9.1* 6.8 6.5 6.2*  ALBUMIN 2.9* 2.2* 2.1* 1.9*   No results for input(s): LIPASE,  AMYLASE in the last 168 hours. No results for input(s): AMMONIA in the last 168 hours. Coagulation Profile: No results for input(s): INR, PROTIME in the last 168 hours. Cardiac Enzymes: No results for input(s): CKTOTAL, CKMB, CKMBINDEX, TROPONINI in the last 168 hours. BNP (last 3 results) No results for input(s): PROBNP in the last 8760 hours. HbA1C: No results for input(s): HGBA1C in the last 72 hours. CBG: No results for input(s): GLUCAP in the last 168 hours. Lipid Profile: No results for input(s): CHOL, HDL, LDLCALC, TRIG, CHOLHDL, LDLDIRECT in the last 72 hours. Thyroid Function Tests: No results for input(s): TSH, T4TOTAL, FREET4, T3FREE, THYROIDAB in the last 72 hours. Anemia Panel: Recent Labs    05/05/19 0000 05/06/19 0039  FERRITIN 867* 879*   Urine analysis:    Component Value Date/Time   COLORURINE AMBER (A) 05/02/2019 0826   APPEARANCEUR TURBID (A) 05/02/2019 0826   APPEARANCEUR Turbid 07/29/2014 1548   LABSPEC 1.020 05/02/2019 0826   LABSPEC 1.018 07/29/2014 1548   PHURINE 7.0 05/02/2019 0826   GLUCOSEU NEGATIVE 05/02/2019 0826   GLUCOSEU Negative 07/29/2014 1548   HGBUR LARGE (A) 05/02/2019 0826   BILIRUBINUR NEGATIVE 05/02/2019 0826   KETONESUR NEGATIVE 05/02/2019 0826   PROTEINUR >=300 (A) 05/02/2019 0826   NITRITE NEGATIVE 05/02/2019 0826   LEUKOCYTESUR MODERATE (A) 05/02/2019 0826   LEUKOCYTESUR 3+ 07/29/2014 1548   Sepsis Labs: @LABRCNTIP (procalcitonin:4,lacticidven:4)  ) Recent Results (from the past 240 hour(s))  Urine culture     Status: Abnormal   Collection Time: 05/02/19  8:26 AM   Specimen: In/Out Cath Urine  Result Value Ref Range Status   Specimen Description   Final    IN/OUT CATH URINE Performed at [facility], [address] [place], [place] [address]    Special Requests   Final    NONE Performed at [facility], [address] [place], [place] [address]    Culture >=100,000 COLONIES/mL PROTEUS  MIRABILIS (A)  Final   Report Status 05/04/2019 FINAL  Final   Organism ID, Bacteria PROTEUS MIRABILIS (A)  Final      Susceptibility   Proteus mirabilis - MIC*    AMPICILLIN >=32 RESISTANT Resistant     CEFAZOLIN <=4 SENSITIVE Sensitive     CEFTRIAXONE <=1 SENSITIVE Sensitive     CIPROFLOXACIN >=4 RESISTANT Resistant     GENTAMICIN <=1 SENSITIVE Sensitive  IMIPENEM 1 SENSITIVE Sensitive     NITROFURANTOIN 256 RESISTANT Resistant     TRIMETH/SULFA >=320 RESISTANT Resistant     AMPICILLIN/SULBACTAM <=2 SENSITIVE Sensitive     PIP/TAZO <=4 SENSITIVE Sensitive     * >=100,000 COLONIES/mL PROTEUS MIRABILIS  Blood culture (routine x 2)     Status: None (Preliminary result)   Collection Time: 05/02/19  8:35 AM   Specimen: BLOOD  Result Value Ref Range Status   Specimen Description BLOOD LEFT HAND  Final   Special Requests   Final    BOTTLES DRAWN AEROBIC AND ANAEROBIC Blood Culture adequate volume   Culture   Final    NO GROWTH 4 DAYS Performed at [facility], [address] [place], [place] [address]    Report Status PENDING  Incomplete  Blood culture (routine x 2)     Status: None (Preliminary result)   Collection Time: 05/02/19  8:35 AM   Specimen: BLOOD  Result Value Ref Range Status   Specimen Description BLOOD RIGHT WRIST  Final   Special Requests   Final    BOTTLES DRAWN AEROBIC AND ANAEROBIC Blood Culture results may not be optimal due to an excessive volume of blood received in culture bottles   Culture   Final    NO GROWTH 4 DAYS Performed at [facility], [address] [place], [place] [address]    Report Status PENDING  Incomplete  MRSA PCR Screening     Status: None   Collection Time: 05/05/19 11:45 AM   Specimen: Nasal Mucosa; Nasopharyngeal  Result Value Ref Range Status   MRSA by PCR NEGATIVE NEGATIVE Final    Comment:        The GeneXpert MRSA Assay (FDA approved for NASAL specimens only), is one component of a comprehensive MRSA  colonization surveillance program. It is not intended to diagnose MRSA infection nor to guide or monitor treatment for MRSA infections. Performed at [facility], [address] [place], [place] [address]          Radiology Studies: Dg Chest Port 1 View  Result Date: 05/05/2019 CLINICAL DATA:  Pneumonia. EXAM: PORTABLE CHEST 1 VIEW COMPARISON:  May 02, 2019. FINDINGS: The heart size and mediastinal contours are within normal limits. No pneumothorax or pleural effusion is noted. Stable bibasilar opacities are noted concerning for pneumonia or atelectasis. The visualized skeletal structures are unremarkable. IMPRESSION: Stable bibasilar opacities as described above. Aortic Atherosclerosis (ICD10-I70.0). Electronically Signed   By: Lupita Raider M.D.   On: 05/05/2019 08:38        Scheduled Meds: . dexamethasone (DECADRON) injection  6 mg Intravenous Q24H  . enoxaparin (LOVENOX) injection  30 mg Subcutaneous Q24H  . erythromycin   Both Eyes Q6H  . influenza vaccine adjuvanted  0.5 mL Intramuscular Tomorrow-1000  . Ipratropium-Albuterol  1 puff Inhalation QID  . mouth rinse  15 mL Mouth Rinse BID  . pneumococcal 23 valent vaccine  0.5 mL Intramuscular Tomorrow-1000  . vitamin C  500 mg Oral Daily  . zinc sulfate  220 mg Oral Daily   Continuous Infusions: . cefTRIAXone (ROCEPHIN)  IV Stopped (05/05/19 1845)  . dextrose 125 mL/hr at 05/06/19 0415  . remdesivir 100 mg in NS 250 mL Stopped (05/05/19 1637)     LOS: 3 days   The patient is critically ill with multiple organ systems failure and requires high complexity decision making for assessment and support, frequent evaluation and titration of therapies, application of advanced  monitoring technologies and extensive interpretation of multiple databases. Critical Care Time devoted to patient care services described in this note  Time spent: 40 minutes     Daviel Allegretto, Roselind Messier, MD [facility]  Pager [phone]  If 7PM-7AM, please contact night-coverage [email or web address] Password [email or web address] 05/06/2019, 10:23 AM

## 2019-05-06 NOTE — NC FL2 (Signed)
[place] MEDICAID FL2 LEVEL OF CARE SCREENING TOOL     IDENTIFICATION  Patient Name: Tiffany Turner Birthdate: 20-Mar-1941 Sex: female Admission Date (Current Location): 05/03/2019  [facility] and [place] Number:  Producer, television/film/video and Address:  Orinda Kenner)      Provider Number: [id number]  Attending Physician Name and Address:  Drema Dallas, MD  Relative Name and Phone Number:       Current Level of Care: Hospital Recommended Level of Care: Skilled Nursing Facility Prior Approval Number:    Date Approved/Denied:   PASRR Number:    Discharge Plan: SNF    Current Diagnoses: Patient Active Problem List   Diagnosis Date Noted  . Hypernatremia 05/04/2019  . Hyperchloremia 05/04/2019  . Bacterial conjunctivitis 05/04/2019  . Alzheimer's dementia (HCC) 05/04/2019  . Acute lower UTI 05/03/2019  . Dehydration with hypernatremia 05/03/2019  . Acute metabolic encephalopathy 05/03/2019  . Sepsis secondary to UTI (HCC) 05/03/2019  . COVID-19 virus infection 05/03/2019  . Pressure injury of skin 05/03/2019  . UTI (urinary tract infection) 04/29/2018  . Palliative care by specialist   . Goals of care, counseling/discussion   . Mild dementia (HCC)   . Altered mental status   . Acute renal failure (HCC)   . Acute cystitis 04/14/2018  . 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 Dementia, Alzheimer's, with behavior disturbance (HCC) 02/16/2015  . Hypertension 02/16/2015    Orientation RESPIRATION BLADDER Height & Weight     Self, Place, Situation  Normal Incontinent Weight: 127 lb 6.4 oz (57.8 kg) Height:  5\' 4"  (162.6 cm)  BEHAVIORAL SYMPTOMS/MOOD NEUROLOGICAL BOWEL NUTRITION STATUS      Incontinent Diet  AMBULATORY STATUS COMMUNICATION OF NEEDS Skin   Total Care Verbally Normal                       Personal Care Assistance Level of Assistance  Bathing, Feeding, Dressing Bathing Assistance: Maximum  assistance Feeding assistance: Limited assistance Dressing Assistance: Maximum assistance     Functional Limitations Info  Sight, Hearing, Speech Sight Info: Adequate Hearing Info: Impaired Speech Info: Adequate    SPECIAL CARE FACTORS FREQUENCY                       Contractures Contractures Info: Not present    Additional Factors Info  Code Status, Allergies Code Status Info: DNR Allergies Info: Penicillins, Sulfa Antibiotics           Current Medications (05/06/2019):  This is the current hospital active medication list Current Facility-Administered Medications  Medication Dose Route Frequency Provider Last Rate Last Dose  . acetaminophen (TYLENOL) tablet 650 mg  650 mg Oral Q6H PRN 05/08/2019 A, MD      . cefTRIAXone (ROCEPHIN) 1 g in sodium chloride 0.9 % 100 mL IVPB  1 g Intravenous Q24H Tarry Kos, MD   Stopped at 05/05/19 1845  . chlorpheniramine-HYDROcodone (TUSSIONEX) 10-8 MG/5ML suspension 5 mL  5 mL Oral Q12H PRN 05/07/19 A, MD      . dexamethasone (DECADRON) injection 6 mg  6 mg Intravenous Q24H Tarry Kos A, MD   6 mg at 05/06/19 05/08/19  . dextrose 5 % solution   Intravenous Continuous 8546, MD 125 mL/hr at 05/06/19 0415    . enoxaparin (LOVENOX) injection 30 mg  30 mg Subcutaneous Q24H 05/08/19, RPH   30 mg at 05/05/19 1816  .  erythromycin ophthalmic ointment   Both Eyes Q6H Allie Bossier, MD      . guaiFENesin-dextromethorphan (ROBITUSSIN DM) 100-10 MG/5ML syrup 10 mL  10 mL Oral Q4H PRN Derrill Kay A, MD      . influenza vaccine adjuvanted (FLUAD) injection 0.5 mL  0.5 mL Intramuscular Tomorrow-1000 Phillips Grout, MD      . Ipratropium-Albuterol (COMBIVENT) respimat 1 puff  1 puff Inhalation QID Allie Bossier, MD   1 puff at 05/05/19 2022  . MEDLINE mouth rinse  15 mL Mouth Rinse BID Derrill Kay A, MD   15 mL at 05/06/19 0951  . ondansetron (ZOFRAN) tablet 4 mg  4 mg Oral Q6H PRN Phillips Grout, MD        Or  . ondansetron (ZOFRAN) injection 4 mg  4 mg Intravenous Q6H PRN Phillips Grout, MD      . pneumococcal 23 valent vaccine (PNU-IMMUNE) injection 0.5 mL  0.5 mL Intramuscular Tomorrow-1000 Derrill Kay A, MD      . potassium chloride 10 mEq in 100 mL IVPB  10 mEq Intravenous Q1 Hr x 5 Allie Bossier, MD 100 mL/hr at 05/06/19 1344 10 mEq at 05/06/19 1344  . remdesivir 100 mg in sodium chloride 0.9 % 250 mL IVPB  100 mg Intravenous Q24H Little Ishikawa, MD   Stopped at 05/05/19 1637  . vitamin C (ASCORBIC ACID) tablet 500 mg  500 mg Oral Daily Derrill Kay A, MD      . zinc sulfate capsule 220 mg  220 mg Oral Daily Phillips Grout, MD         Discharge Medications: Please see discharge summary for a list of discharge medications.  Relevant Imaging Results:  Relevant Lab Results:   Additional Information    Shade Flood, LCSW

## 2019-05-06 NOTE — Plan of Care (Signed)
Pt appears more alert overnight. Will open eyes when name is called, though still remains lethargic.  No signs of pain noted. Vitals stable on RA. Non - productive cough noted. Dependent with ADLs, incontinent. Purewick replaced. Skin assessed, wound dressings dry and intact. Maintenance IV fluids continued. Frequent oral care regular repositioning ensured - still awaiting prevlon boots and specialty mattress/overlay. No other issues, will monitor.   Problem: Respiratory: Goal: Will maintain a patent airway Outcome: Progressing Goal: Complications related to the disease process, condition or treatment will be avoided or minimized Outcome: Progressing   Problem: Clinical Measurements: Goal: Ability to maintain clinical measurements within normal limits will improve Outcome: Progressing Goal: Will remain free from infection Outcome: Progressing Goal: Diagnostic test results will improve Outcome: Progressing Goal: Respiratory complications will improve Outcome: Progressing Goal: Cardiovascular complication will be avoided Outcome: Progressing

## 2019-05-06 NOTE — TOC Initial Note (Signed)
Transition of Care [facility]) - Initial/Assessment Note    Patient Details  Name: Tiffany Turner MRN: [id number] Date of Birth: 1940-09-08  Transition of Care [facility]) CM/SW Contact:    Shade Flood, LCSW Phone Number: 05/06/2019, 1:44 PM  Clinical Narrative:                  Pt admitted from [facility]. Spoke with RN at [facility] to update and obtain baseline function information. Per RN, pt total care. She does not get out of bed. Pt's son is her legal guardian. [facility] will plan to re-admit pt when she is stable for dc.  TOC will follow.  Expected Discharge Plan: Skilled Nursing Facility Barriers to Discharge: Continued Medical Work up   Patient Goals and CMS Choice        Expected Discharge Plan and Services Expected Discharge Plan: [place] In-house Referral: Clinical Social Work     Living arrangements for the past 2 months: [place]                                      Prior Living Arrangements/Services Living arrangements for the past 2 months: [place] Lives with:: Facility Resident Patient language and need for interpreter reviewed:: Yes Do you feel safe going back to the place where you live?: Yes      Need for Family Participation in Patient Care: No (Comment) Care giver support system in place?: Yes (comment)   Criminal Activity/Legal Involvement Pertinent to Current Situation/Hospitalization: No - Comment as needed  Activities of Daily Living Home Assistive Devices/Equipment: None(UTA) ADL Screening (condition at time of admission) Patient's cognitive ability adequate to safely complete daily activities?: No Is the patient deaf or have difficulty hearing?: Yes Does the patient have difficulty seeing, even when wearing glasses/contacts?: Yes Does the patient have difficulty concentrating, remembering, or making decisions?: Yes Patient able to express need for assistance with ADLs?: No Does the  patient have difficulty dressing or bathing?: Yes Independently performs ADLs?: No Communication: Dependent Is this a change from baseline?: Pre-admission baseline Dressing (OT): Dependent Is this a change from baseline?: Pre-admission baseline Grooming: Dependent Is this a change from baseline?: Pre-admission baseline Feeding: Dependent Is this a change from baseline?: Pre-admission baseline Bathing: Dependent Is this a change from baseline?: Pre-admission baseline Toileting: Dependent Is this a change from baseline?: Pre-admission baseline In/Out Bed: Dependent Is this a change from baseline?: Pre-admission baseline Walks in Home: Dependent Is this a change from baseline?: Pre-admission baseline Does the patient have difficulty walking or climbing stairs?: Yes Weakness of Legs: Both Weakness of Arms/Hands: Both  Permission Sought/Granted                  Emotional Assessment Appearance:: Appears stated age     Orientation: : Oriented to Self Alcohol / Substance Use: Not Applicable Psych Involvement: No (comment)  Admission diagnosis:  COVID [place] ALTERED MENTAL STAT Patient Active Problem List   Diagnosis Date Noted  . Hypernatremia 05/04/2019  . Hyperchloremia 05/04/2019  . Bacterial conjunctivitis 05/04/2019  . Alzheimer's dementia ([place]) 05/04/2019  . Acute lower UTI 05/03/2019  . Dehydration with hypernatremia 05/03/2019  . Acute metabolic encephalopathy 73/71/0626  . Sepsis secondary to UTI ([place]) 05/03/2019  . COVID-19 virus infection 05/03/2019  . Pressure injury of skin 05/03/2019  . UTI (urinary tract infection) 04/29/2018  . Palliative care by specialist   .  Goals of care, counseling/discussion   . Mild dementia (HCC)   . Altered mental status   . Acute renal failure (HCC)   . Acute cystitis 04/14/2018  . 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 Dementia, Alzheimer's, with behavior disturbance  (HCC) 02/16/2015  . Hypertension 02/16/2015   PCP:  Patient, No Pcp Per Pharmacy:   [facility] [address] [place], [place] - [address] [place] [address] Phone: [phone] Fax: [phone]     Social Determinants of Health (SDOH) Interventions    Readmission Risk Interventions Readmission Risk Prevention Plan 05/01/2018  Transportation Screening Complete  PCP or Specialist Appt within 3-5 Days Complete  Home Care Screening Complete  Social Work Consult for Recovery Care Planning/Counseling Complete  Palliative Care Screening Complete  Medication Review Oceanographer) Complete  Some recent data might be hidden

## 2019-05-07 ENCOUNTER — Inpatient Hospital Stay (HOSPITAL_COMMUNITY): Payer: Medicare Other

## 2019-05-07 DIAGNOSIS — E872 Acidosis, unspecified: Secondary | ICD-10-CM | POA: Diagnosis present

## 2019-05-07 LAB — COMPREHENSIVE METABOLIC PANEL
ALT: 42 U/L (ref 0–44)
AST: 78 U/L — ABNORMAL HIGH (ref 15–41)
Albumin: 1.9 g/dL — ABNORMAL LOW (ref 3.5–5.0)
Alkaline Phosphatase: 83 U/L (ref 38–126)
Anion gap: 11 (ref 5–15)
BUN: 47 mg/dL — ABNORMAL HIGH (ref 8–23)
CO2: 16 mmol/L — ABNORMAL LOW (ref 22–32)
Calcium: 7.5 mg/dL — ABNORMAL LOW (ref 8.9–10.3)
Chloride: 112 mmol/L — ABNORMAL HIGH (ref 98–111)
Creatinine, Ser: 0.99 mg/dL (ref 0.44–1.00)
GFR calc Af Amer: >60 mL/min (ref >60)
GFR calc non Af Amer: 54 mL/min — ABNORMAL LOW (ref >60)
Glucose, Bld: 219 mg/dL — ABNORMAL HIGH (ref 70–99)
Potassium: 3.9 mmol/L (ref 3.5–5.1)
Sodium: 139 mmol/L (ref 135–145)
Total Bilirubin: 0.7 mg/dL (ref 0.3–1.2)
Total Protein: 6 g/dL — ABNORMAL LOW (ref 6.5–8.1)

## 2019-05-07 LAB — CBC WITH DIFFERENTIAL/PLATELET
Abs Immature Granulocytes: 0.34 10*3/uL — ABNORMAL HIGH (ref 0.00–0.07)
Basophils Absolute: 0.1 10*3/uL (ref 0.0–0.1)
Basophils Relative: 0 %
Eosinophils Absolute: 0 10*3/uL (ref 0.0–0.5)
Eosinophils Relative: 0 %
HCT: 29.4 % — ABNORMAL LOW (ref 36.0–46.0)
Hemoglobin: 9.5 g/dL — ABNORMAL LOW (ref 12.0–15.0)
Immature Granulocytes: 2 %
Lymphocytes Relative: 7 %
Lymphs Abs: 1.1 10*3/uL (ref 0.7–4.0)
MCH: 33 pg (ref 26.0–34.0)
MCHC: 32.3 g/dL (ref 30.0–36.0)
MCV: 102.1 fL — ABNORMAL HIGH (ref 80.0–100.0)
Monocytes Absolute: 0.4 10*3/uL (ref 0.1–1.0)
Monocytes Relative: 3 %
Neutro Abs: 14 10*3/uL — ABNORMAL HIGH (ref 1.7–7.7)
Neutrophils Relative %: 88 %
Platelets: 319 10*3/uL (ref 150–400)
RBC: 2.88 MIL/uL — ABNORMAL LOW (ref 3.87–5.11)
RDW: 12.9 % (ref 11.5–15.5)
WBC: 15.8 10*3/uL — ABNORMAL HIGH (ref 4.0–10.5)
nRBC: 0 % (ref 0.0–0.2)

## 2019-05-07 LAB — CULTURE, BLOOD (ROUTINE X 2)
Culture: NO GROWTH
Culture: NO GROWTH
Special Requests: ADEQUATE

## 2019-05-07 LAB — PHOSPHORUS: Phosphorus: 2.5 mg/dL (ref 2.5–4.6)

## 2019-05-07 LAB — MAGNESIUM: Magnesium: 1.8 mg/dL (ref 1.7–2.4)

## 2019-05-07 LAB — FERRITIN: Ferritin: 701 ng/mL — ABNORMAL HIGH (ref 11–307)

## 2019-05-07 LAB — D-DIMER, QUANTITATIVE: D-Dimer, Quant: 2.15 ug/mL-FEU — ABNORMAL HIGH (ref 0.00–0.50)

## 2019-05-07 LAB — C-REACTIVE PROTEIN: CRP: 3.2 mg/dL — ABNORMAL HIGH (ref <1.0)

## 2019-05-07 MED ORDER — ALBUMIN HUMAN 25 % IV SOLN
25.0000 g | Freq: Once | INTRAVENOUS | Status: DC
  Filled 2019-05-07: qty 50

## 2019-05-07 MED ORDER — DEXTROSE-NACL 5-0.9 % IV SOLN
INTRAVENOUS | Status: DC
  Administered 2019-05-07 – 2019-05-12 (×10): via INTRAVENOUS

## 2019-05-07 MED ORDER — ENOXAPARIN SODIUM 40 MG/0.4ML ~~LOC~~ SOLN
40.0000 mg | SUBCUTANEOUS | Status: DC
  Administered 2019-05-07 – 2019-05-16 (×10): 40 mg via SUBCUTANEOUS
  Filled 2019-05-07 (×10): qty 0.4

## 2019-05-07 MED ORDER — MAGNESIUM SULFATE IN D5W 1-5 GM/100ML-% IV SOLN
1.0000 g | Freq: Once | INTRAVENOUS | Status: AC
  Administered 2019-05-07: 1 g via INTRAVENOUS
  Filled 2019-05-07: qty 100

## 2019-05-07 MED ORDER — ALBUMIN HUMAN 25 % IV SOLN
25.0000 g | Freq: Once | INTRAVENOUS | Status: AC
  Administered 2019-05-07: 25 g via INTRAVENOUS
  Filled 2019-05-07: qty 50

## 2019-05-07 MED ORDER — SODIUM BICARBONATE 8.4 % IV SOLN
50.0000 meq | Freq: Once | INTRAVENOUS | Status: AC
  Administered 2019-05-08: 50 meq via INTRAVENOUS
  Filled 2019-05-07: qty 50

## 2019-05-07 NOTE — Progress Notes (Signed)
Per xray report, NG tube advanced 5 cm and secured.  Tube is marked.  MD notified tube ready to use.

## 2019-05-07 NOTE — Progress Notes (Signed)
PROGRESS NOTE    Tiffany Turner  GMW:[id number] DOB: [birth year] DOA: 05/03/2019 PCP: Patient, No Pcp Per   Brief Narrative:  Tiffany Turner is a 78 y.o. WF PMHx  advanced dementia, htn, CAD   presents with confusion over the last several days.  Pt with h/o freq utis.  Cannot provide any history due to her dementia.  All history obtained from records.  Found to be septic from uti with aki cr over 5 and covid positive.  Given vanc and azactam.    Subjective: 10/31 A/O x1 (does not know where, when, why,).  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)   Acute renal failure (HCC)   Acute lower UTI   Dehydration with hypernatremia   Acute metabolic encephalopathy   COVID-19 virus infection   Pressure injury of skin   Hypernatremia   Hyperchloremia   Bacterial conjunctivitis   Alzheimer's dementia (HCC)   Protein-calorie malnutrition, moderate (HCC)   Metabolic acidosis  Covid 19 virus infection -Negative respiratory symptoms  -Placed on continuous O2 monitoring -10/29 PCXR bibasilar opacification see results below -Decadron 6 mg daily -Remdesivir per pharmacy protocol -Titrate O2 to maintain SPO2> 88% -Combivent QID -Flutter valve -Vitamins per Covid protocol COVID-19 Labs  Recent Labs    05/05/19 0000 05/06/19 0039 05/07/19 0224  DDIMER 3.00* 2.55* 2.15*  FERRITIN 867* 879* 701*  LDH 395*  --   --   CRP 3.2* 2.5* 3.2*  -10/29 SARS coronavirus pending  Proteus Mirabilis UTI -On admission patient does not meet criteria for sepsis. -Treat for 5-day course.  Acute renal failure? -Patient's last creatinine 05/01/2018   0.80, unsure of how much of this change is acute given patient's clinical picture, suspect chronic component. -Per admission note family does not desire HD. Recent Labs  Lab  05/04/19 1145 05/04/19 1805 05/05/19 0000 05/06/19 0039 05/07/19 0224  CREATININE 1.91* 1.78* 1.68* 1.16* 0.99  -Avoid nephrotoxic medication -Creatinine improving with hydration.  Severe metabolic derangement -Apparent patient has not been eating or drinking appropriately for at least weeks.  Will attempt to correct underlying metabolic abnormalities.  Metabolic acidosis -1 amp of Nabicarb  Hypernatremia/hyperchloremia -Secondary to severe dehydration -10/31 D5W-0.9% saline 85ml/hr.  Beginning to become hyponatremic  Essential HTN -Stable off BP medication  Alzheimer's dementia -Unsure of baseline currently.  Increase response to painful stimuli after 24 hours of hydration.    Acute metabolic encephalopathy -Multifactorial severe metabolic derangement secondary to severe dehydration and malnourishment, infection -Correct underlying causes    Bacterial Conjunctivitis -Erythromycin ointment x7 days  Hypokalemia -Potassium goal> 4  Hypomagnesmia -Magnesium goal> 2 -Magnesium IV 1 g  Protein calorie malnutrition moderate have been attempting to contact son since admission unsuccessfully will insert NG tube, medical necessity in order to provide nutrition for patient. -10/31-second time consulted nutrition for enteral feeds  -10/31 Albumin 25 g      DVT prophylaxis: Lovenox Code Status: DNR Family Communication: 10/31  Spoke to call [name] (son) counseled him on plan of care and answered all questions.  In addition counseled him on the condition that his mother was brought into the hospital and my concern that APS should be notified.  He agreed that we should notify APS concerning her severe debilitated condition prior to being brought to the hospital. Disposition Plan: TBD   Consultants:  Procedures/Significant Events:  PCXR 10/29;-Stable bibasilar opacities are noted concerning for pneumonia or atelectasis.    I have personally reviewed and interpreted all  radiology studies and my findings are as above.  VENTILATOR SETTINGS:    Cultures 10/26 urine positive PROTEUS MIRABILIS  10/26 blood LEFT hand NGTD 10/26 blood RIGHT wrist NGTD 10/29 SARS coronavirus pending    Antimicrobials: Anti-infectives (From admission, onward)   Start     Stop   05/04/19 1600  remdesivir 100 mg in sodium chloride 0.9 % 250 mL IVPB     05/08/19 1559   05/04/19 1300  fosfomycin (MONUROL) packet 3 g         05/04/19 1200  cefTRIAXone (ROCEPHIN) 1 g in sodium chloride 0.9 % 100 mL IVPB  Status:  Discontinued     05/04/19 1219   05/03/19 1600  ciprofloxacin (CIPRO) IVPB 200 mg  Status:  Discontinued     05/04/19 1148   05/03/19 1600  remdesivir 200 mg in sodium chloride 0.9 % 250 mL IVPB     05/03/19 1730   05/03/19 0600  aztreonam (AZACTAM) 0.5 g in dextrose 5 % 50 mL IVPB  Status:  Discontinued     05/03/19 0815   05/03/19 0048  vancomycin variable dose per unstable renal function (pharmacist dosing)  Status:  Discontinued     05/03/19 0815       Devices    LINES / TUBES:      Continuous Infusions:  albumin human     cefTRIAXone (ROCEPHIN)  IV 1 g (05/06/19 1759)   dextrose 5 % and 0.9% NaCl 75 mL/hr at 05/07/19 1223   remdesivir 100 mg in NS 250 mL 100 mg (05/07/19 1532)     Objective: Vitals:   05/07/19 0454 05/07/19 0736 05/07/19 1129 05/07/19 1541  BP: 131/66 114/87 131/69 122/67  Pulse: 73 72 65 60  Resp: 11 17 15 14   Temp: 98 F (36.7 C) (!) 97.4 F (36.3 C) 97.9 F (36.6 C) (!) 97.3 F (36.3 C)  TempSrc: Oral Axillary Oral Axillary  SpO2: 100% 97% 100% 100%  Weight:      Height:        Intake/Output Summary (Last 24 hours) at 05/07/2019 1546 Last data filed at 05/07/2019 1427 Gross per 24 hour  Intake 1371.06 ml  Output 3150 ml  Net -1778.94 ml   Filed Weights   05/03/19 0015  Weight: 57.8 kg   Physical Exam:  General: A/O x1 (does not know where, when, why) no acute respiratory distress Eyes: negative  scleral hemorrhage, negative anisocoria, negative icterus bilateral purulent discharge RIGHT>> LEFT beginning to clear ENT: Negative Runny nose, negative gingival bleeding, mucosa continues to be dry Neck:  Negative scars, masses, torticollis, lymphadenopathy, JVD Lungs: Clear to auscultation bilaterally without wheezes or crackles Cardiovascular: Regular rate and rhythm without murmur gallop or rub normal S1 and S2 Abdomen: negative abdominal pain, nondistended, positive soft, bowel sounds, no rebound, no ascites, no appreciable mass Extremities: No significant cyanosis, clubbing, positive +1 edema bilateral extremities Skin: Negative rashes, lesions, ulcers Psychiatric: Altered mental status cannot fully assess Central nervous system: A/O x1, does not follow commands withdraws to painful stimuli minimally   .     Data Reviewed: Care during the described time interval was provided by me .  I have reviewed this patient's available data, including medical history, events of note, physical examination, and all test results as part of my evaluation.   CBC: Recent Labs  Lab 05/02/19  [id number] 05/03/19 0440 05/04/19 0600 05/05/19 0000 05/06/19 0039 05/07/19 0224  WBC 30.3* 29.1* 21.3* 18.3* 15.7* 15.8*  NEUTROABS 28.0*  --  19.3* 16.8* 13.4* 14.0*  HGB 13.0 11.0* 10.1* 9.5* 9.7* 9.5*  HCT 42.3 36.3 33.2* 30.6* 30.5* 29.4*  MCV 107.6* 111.3* 109.9* 107.4* 104.1* 102.1*  PLT 479* 333 347 341 327 319   Basic Metabolic Panel: Recent Labs  Lab 05/04/19 0625 05/04/19 1145 05/04/19 1805 05/05/19 0000 05/06/19 0039 05/07/19 0224  NA  --  165* 161* 157* 144 139  K  --  4.5 4.4 4.1 3.2* 3.9  CL  --  >130* >130* 129* 116* 112*  CO2  --  20* 20* 18* 18* 16*  GLUCOSE  --  120* 223* 272* 232* 219*  BUN  --  119* 113* 100* 66* 47*  CREATININE  --  1.91* 1.78* 1.68* 1.16* 0.99  CALCIUM  --  8.9 8.5* 8.4* 7.7* 7.5*  MG 2.4  --   --  2.1 1.4* 1.8  PHOS  --   --   --  2.3* 2.5 2.5    GFR: Estimated Creatinine Clearance: 39.8 mL/min (by C-G formula based on SCr of 0.99 mg/dL). Liver Function Tests: Recent Labs  Lab 05/02/19 0826 05/04/19 0600 05/05/19 0000 05/06/19 0039 05/07/19 0224  AST 113* 66* 73* 77* 78*  ALT 73* 41 38 40 42  ALKPHOS 96 104 87 82 83  BILITOT 0.8 0.5 0.8 0.4 0.7  PROT 9.1* 6.8 6.5 6.2* 6.0*  ALBUMIN 2.9* 2.2* 2.1* 1.9* 1.9*   No results for input(s): LIPASE, AMYLASE in the last 168 hours. No results for input(s): AMMONIA in the last 168 hours. Coagulation Profile: No results for input(s): INR, PROTIME in the last 168 hours. Cardiac Enzymes: No results for input(s): CKTOTAL, CKMB, CKMBINDEX, TROPONINI in the last 168 hours. BNP (last 3 results) No results for input(s): PROBNP in the last 8760 hours. HbA1C: No results for input(s): HGBA1C in the last 72 hours. CBG: No results for input(s): GLUCAP in the last 168 hours. Lipid Profile: No results for input(s): CHOL, HDL, LDLCALC, TRIG, CHOLHDL, LDLDIRECT in the last 72 hours. Thyroid Function Tests: No results for input(s): TSH, T4TOTAL, FREET4, T3FREE, THYROIDAB in the last 72 hours. Anemia Panel: Recent Labs    05/06/19 0039 05/07/19 0224  FERRITIN 879* 701*   Urine analysis:    Component Value Date/Time   COLORURINE AMBER (A) 05/02/2019 0826   APPEARANCEUR TURBID (A) 05/02/2019 0826   APPEARANCEUR Turbid 07/29/2014 1548   LABSPEC 1.020 05/02/2019 0826   LABSPEC 1.018 07/29/2014 1548   PHURINE 7.0 05/02/2019 0826   GLUCOSEU NEGATIVE 05/02/2019 0826   GLUCOSEU Negative 07/29/2014 1548   HGBUR LARGE (A) 05/02/2019 0826   BILIRUBINUR NEGATIVE 05/02/2019 0826   KETONESUR NEGATIVE 05/02/2019 0826   PROTEINUR >=300 (A) 05/02/2019 0826   NITRITE NEGATIVE 05/02/2019 0826   LEUKOCYTESUR MODERATE (A) 05/02/2019 0826   LEUKOCYTESUR 3+ 07/29/2014 1548   Sepsis Labs: @LABRCNTIP (procalcitonin:4,lacticidven:4)  ) Recent Results (from the past 240 hour(s))  Urine culture      Status: Abnormal   Collection Time: 05/02/19  8:26 AM   Specimen: In/Out Cath Urine  Result Value Ref Range Status   Specimen Description   Final    IN/OUT CATH URINE Performed at [facility], [address] [place], [place] [address]    Special Requests   Final    NONE Performed at [facility], [address] [place], [place] [address]  Culture >=100,000 COLONIES/mL PROTEUS MIRABILIS (A)  Final   Report Status 05/04/2019 FINAL  Final   Organism ID, Bacteria PROTEUS MIRABILIS (A)  Final      Susceptibility   Proteus mirabilis - MIC*    AMPICILLIN >=32 RESISTANT Resistant     CEFAZOLIN <=4 SENSITIVE Sensitive     CEFTRIAXONE <=1 SENSITIVE Sensitive     CIPROFLOXACIN >=4 RESISTANT Resistant     GENTAMICIN <=1 SENSITIVE Sensitive     IMIPENEM 1 SENSITIVE Sensitive     NITROFURANTOIN 256 RESISTANT Resistant     TRIMETH/SULFA >=320 RESISTANT Resistant     AMPICILLIN/SULBACTAM <=2 SENSITIVE Sensitive     PIP/TAZO <=4 SENSITIVE Sensitive     * >=100,000 COLONIES/mL PROTEUS MIRABILIS  Blood culture (routine x 2)     Status: None   Collection Time: 05/02/19  8:35 AM   Specimen: BLOOD  Result Value Ref Range Status   Specimen Description BLOOD LEFT HAND  Final   Special Requests   Final    BOTTLES DRAWN AEROBIC AND ANAEROBIC Blood Culture adequate volume   Culture   Final    NO GROWTH 5 DAYS Performed at [facility], [address] [place], [place] [address]    Report Status 05/07/2019 FINAL  Final  Blood culture (routine x 2)     Status: None   Collection Time: 05/02/19  8:35 AM   Specimen: BLOOD  Result Value Ref Range Status   Specimen Description BLOOD RIGHT WRIST  Final   Special Requests   Final    BOTTLES DRAWN AEROBIC AND ANAEROBIC Blood Culture results may not be optimal due to an excessive volume of blood received in culture bottles   Culture   Final    NO GROWTH 5 DAYS Performed at [facility], [address]  [place], [place] [address]    Report Status 05/07/2019 FINAL  Final  MRSA PCR Screening     Status: None   Collection Time: 05/05/19 11:45 AM   Specimen: Nasal Mucosa; Nasopharyngeal  Result Value Ref Range Status   MRSA by PCR NEGATIVE NEGATIVE Final    Comment:        The GeneXpert MRSA Assay (FDA approved for NASAL specimens only), is one component of a comprehensive MRSA colonization surveillance program. It is not intended to diagnose MRSA infection nor to guide or monitor treatment for MRSA infections. Performed at [facility], [address], [place], [place] [address]   SARS CORONAVIRUS 2 (TAT 6-24 HRS) Nasopharyngeal Nasopharyngeal Swab     Status: Abnormal   Collection Time: 05/05/19  8:18 PM   Specimen: Nasopharyngeal Swab  Result Value Ref Range Status   SARS Coronavirus 2 POSITIVE (A) NEGATIVE Final    Comment: RESULT CALLED TO, READ BACK BY AND VERIFIED WITH: S.JOHNSEN RN [id number] 05/06/2019 MCCORMICK K (NOTE) SARS-CoV-2 target nucleic acids are DETECTED. The SARS-CoV-2 RNA is generally detectable in upper and lower respiratory specimens during the acute phase of infection. Positive results are indicative of active infection with SARS-CoV-2. Clinical  correlation with patient history and other diagnostic information is necessary to determine patient infection status. Positive results do  not rule out bacterial infection or co-infection with other viruses. The expected result is Negative. Fact Sheet for Patients: HairSlick.no Fact Sheet for Healthcare Providers: quierodirigir.com This test is not yet approved or cleared by the [place] FDA and  has been authorized for detection and/or diagnosis of SARS-CoV-2 by FDA under an Emergency Use Authorization (EUA).  This EUA will remain  in effect (meaning this test can be used)  for the duration of the COVID-19 declaration under Section 564(b)(1) of  the Act, 21 U.S.C. section 360bbb-3(b)(1), unless the authorization is terminated or revoked sooner. Performed at [facility], [place] [address] [place], [place] [address]          Radiology Studies: Dg Chest Port 1 View  Result Date: 05/07/2019 CLINICAL DATA:  31 74-year-old female with NG tube placement. EXAM: PORTABLE CHEST 1 VIEW COMPARISON:  Chest radiograph dated 05/06/2019 FINDINGS: An enteric tube is noted with side port chest distal to the GE junction and tip in the left hemiabdomen likely in the proximal stomach. The tube can be further advanced by approximately 3-5 cm for optimal positioning. Bibasilar interstitial coarsening and nodularity, likely atelectasis or scarring. Pneumonia is not excluded. Clinical correlation is recommended. No focal consolidation, pleural effusion, or pneumothorax. Top-normal cardiac silhouette. Atherosclerotic calcification of the aortic arch. No acute osseous pathology. IMPRESSION: 1. Enteric tube with tip in the proximal stomach. 2. Bibasilar interstitial coarsening and nodularity, likely atelectasis or scarring. Pneumonia is not excluded. Electronically Signed   By: Anner Crete M.D.   On: 05/07/2019 02:18   Dg Chest Port 1 View  Result Date: 05/06/2019 CLINICAL DATA:  NG tube placement EXAM: PORTABLE CHEST 1 VIEW COMPARISON:  Radiograph 05/05/2019 FINDINGS: The NG tube appears coiled within the proximal thoracic esophagus with the catheter kinked at the level of side port. Bibasilar opacities concerning for atelectasis or pneumonia are similar to comparison exam. No pneumothorax or visible effusion. The aorta is calcified. The remaining cardiomediastinal contours are unremarkable. No acute osseous or soft tissue abnormality. IMPRESSION: 1. The NG tube appears coiled within the proximal thoracic esophagus with the catheter kinked at the level of side port. 2. Bibasilar opacities concerning for atelectasis or pneumonia, similar to comparison  exam. These results will be called to the ordering clinician or representative by the Radiologist Assistant, and communication documented in the PACS or zVision Dashboard. Electronically Signed   By: Lovena Le M.D.   On: 05/06/2019 19:36        Scheduled Meds:  dexamethasone (DECADRON) injection  6 mg Intravenous Q24H   enoxaparin (LOVENOX) injection  40 mg Subcutaneous Q24H   erythromycin   Both Eyes Q6H   Ipratropium-Albuterol  1 puff Inhalation QID   mouth rinse  15 mL Mouth Rinse BID   sodium bicarbonate  50 mEq Intravenous Once   vitamin C  500 mg Oral Daily   zinc sulfate  220 mg Oral Daily   Continuous Infusions:  albumin human     cefTRIAXone (ROCEPHIN)  IV 1 g (05/06/19 1759)   dextrose 5 % and 0.9% NaCl 75 mL/hr at 05/07/19 1223   remdesivir 100 mg in NS 250 mL 100 mg (05/07/19 1532)     LOS: 4 days   The patient is critically ill with multiple organ systems failure and requires high complexity decision making for assessment and support, frequent evaluation and titration of therapies, application of advanced monitoring technologies and extensive interpretation of multiple databases. Critical Care Time devoted to patient care services described in this note  Time spent: 40 minutes     Azalea Cedar, Geraldo Docker, MD [facility] Pager [phone]  If 7PM-7AM, please contact night-coverage [email or web address] Password [facility] 05/07/2019, 3:46 PM

## 2019-05-08 LAB — COMPREHENSIVE METABOLIC PANEL
ALT: 38 U/L (ref 0–44)
AST: 59 U/L — ABNORMAL HIGH (ref 15–41)
Albumin: 2.5 g/dL — ABNORMAL LOW (ref 3.5–5.0)
Alkaline Phosphatase: 70 U/L (ref 38–126)
Anion gap: 10 (ref 5–15)
BUN: 35 mg/dL — ABNORMAL HIGH (ref 8–23)
CO2: 20 mmol/L — ABNORMAL LOW (ref 22–32)
Calcium: 7.6 mg/dL — ABNORMAL LOW (ref 8.9–10.3)
Chloride: 116 mmol/L — ABNORMAL HIGH (ref 98–111)
Creatinine, Ser: 0.77 mg/dL (ref 0.44–1.00)
GFR calc Af Amer: >60 mL/min (ref >60)
GFR calc non Af Amer: >60 mL/min (ref >60)
Glucose, Bld: 125 mg/dL — ABNORMAL HIGH (ref 70–99)
Potassium: 2.9 mmol/L — ABNORMAL LOW (ref 3.5–5.1)
Sodium: 146 mmol/L — ABNORMAL HIGH (ref 135–145)
Total Bilirubin: 0.3 mg/dL (ref 0.3–1.2)
Total Protein: 6.1 g/dL — ABNORMAL LOW (ref 6.5–8.1)

## 2019-05-08 LAB — CBC WITH DIFFERENTIAL/PLATELET
Abs Immature Granulocytes: 0.33 10*3/uL — ABNORMAL HIGH (ref 0.00–0.07)
Basophils Absolute: 0.1 10*3/uL (ref 0.0–0.1)
Basophils Relative: 0 %
Eosinophils Absolute: 0 10*3/uL (ref 0.0–0.5)
Eosinophils Relative: 0 %
HCT: 28.7 % — ABNORMAL LOW (ref 36.0–46.0)
Hemoglobin: 9.2 g/dL — ABNORMAL LOW (ref 12.0–15.0)
Immature Granulocytes: 2 %
Lymphocytes Relative: 5 %
Lymphs Abs: 0.9 10*3/uL (ref 0.7–4.0)
MCH: 32.9 pg (ref 26.0–34.0)
MCHC: 32.1 g/dL (ref 30.0–36.0)
MCV: 102.5 fL — ABNORMAL HIGH (ref 80.0–100.0)
Monocytes Absolute: 0.4 10*3/uL (ref 0.1–1.0)
Monocytes Relative: 3 %
Neutro Abs: 15.1 10*3/uL — ABNORMAL HIGH (ref 1.7–7.7)
Neutrophils Relative %: 90 %
Platelets: 289 10*3/uL (ref 150–400)
RBC: 2.8 MIL/uL — ABNORMAL LOW (ref 3.87–5.11)
RDW: 12.7 % (ref 11.5–15.5)
WBC: 16.8 10*3/uL — ABNORMAL HIGH (ref 4.0–10.5)
nRBC: 0 % (ref 0.0–0.2)

## 2019-05-08 LAB — FERRITIN: Ferritin: 593 ng/mL — ABNORMAL HIGH (ref 11–307)

## 2019-05-08 LAB — GLUCOSE, CAPILLARY
Glucose-Capillary: 167 mg/dL — ABNORMAL HIGH (ref 70–99)
Glucose-Capillary: 244 mg/dL — ABNORMAL HIGH (ref 70–99)
Glucose-Capillary: 205 mg/dL — ABNORMAL HIGH (ref 70–99)
Glucose-Capillary: 161 mg/dL — ABNORMAL HIGH (ref 70–99)

## 2019-05-08 LAB — D-DIMER, QUANTITATIVE: D-Dimer, Quant: 1.78 ug/mL-FEU — ABNORMAL HIGH (ref 0.00–0.50)

## 2019-05-08 LAB — C-REACTIVE PROTEIN: CRP: 1.7 mg/dL — ABNORMAL HIGH (ref <1.0)

## 2019-05-08 LAB — PHOSPHORUS: Phosphorus: 2.6 mg/dL (ref 2.5–4.6)

## 2019-05-08 LAB — MAGNESIUM: Magnesium: 1.8 mg/dL (ref 1.7–2.4)

## 2019-05-08 MED ORDER — INSULIN ASPART 100 UNIT/ML ~~LOC~~ SOLN
0.0000 [IU] | SUBCUTANEOUS | Status: DC
  Administered 2019-05-08: 3 [IU] via SUBCUTANEOUS
  Administered 2019-05-08 – 2019-05-09 (×2): 2 [IU] via SUBCUTANEOUS
  Administered 2019-05-09: 1 [IU] via SUBCUTANEOUS
  Administered 2019-05-09 – 2019-05-10 (×4): 2 [IU] via SUBCUTANEOUS
  Administered 2019-05-10: 1 [IU] via SUBCUTANEOUS
  Administered 2019-05-10: 2 [IU] via SUBCUTANEOUS
  Administered 2019-05-10: 3 [IU] via SUBCUTANEOUS
  Administered 2019-05-10: 1 [IU] via SUBCUTANEOUS
  Administered 2019-05-10: 2 [IU] via SUBCUTANEOUS
  Administered 2019-05-11: 1 [IU] via SUBCUTANEOUS
  Administered 2019-05-11 (×3): 2 [IU] via SUBCUTANEOUS
  Administered 2019-05-11: 3 [IU] via SUBCUTANEOUS
  Administered 2019-05-12 (×2): 1 [IU] via SUBCUTANEOUS
  Administered 2019-05-12: 2 [IU] via SUBCUTANEOUS
  Administered 2019-05-12 – 2019-05-14 (×4): 1 [IU] via SUBCUTANEOUS

## 2019-05-08 MED ORDER — OSMOLITE 1.2 CAL PO LIQD
1000.0000 mL | ORAL | Status: DC
  Administered 2019-05-08 – 2019-05-12 (×2): 1000 mL
  Filled 2019-05-08 (×11): qty 1000

## 2019-05-08 MED ORDER — HYDROMORPHONE HCL 1 MG/ML IJ SOLN
0.5000 mg | INTRAMUSCULAR | Status: DC | PRN
  Administered 2019-05-08 (×2): 1 mg via INTRAVENOUS
  Administered 2019-05-08: 0.5 mg via INTRAVENOUS
  Administered 2019-05-09: 1 mg via INTRAVENOUS
  Administered 2019-05-09 – 2019-05-10 (×2): 0.5 mg via INTRAVENOUS
  Filled 2019-05-08 (×6): qty 1

## 2019-05-08 MED ORDER — FREE WATER
100.0000 mL | Freq: Three times a day (TID) | Status: DC
  Administered 2019-05-08 – 2019-05-09 (×5): 100 mL

## 2019-05-08 MED ORDER — PRO-STAT SUGAR FREE PO LIQD
30.0000 mL | Freq: Two times a day (BID) | ORAL | Status: DC
  Administered 2019-05-08 – 2019-05-14 (×13): 30 mL
  Filled 2019-05-08 (×13): qty 30

## 2019-05-08 MED ORDER — POTASSIUM CHLORIDE 20 MEQ/15ML (10%) PO SOLN
60.0000 meq | Freq: Once | ORAL | Status: AC
  Administered 2019-05-08: 60 meq
  Filled 2019-05-08: qty 45

## 2019-05-08 MED ORDER — JEVITY 1.2 CAL PO LIQD: 1000.0000 mL | ORAL | Status: DC

## 2019-05-08 MED ORDER — MAGNESIUM SULFATE 2 GM/50ML IV SOLN
2.0000 g | Freq: Once | INTRAVENOUS | Status: AC
  Administered 2019-05-08: 2 g via INTRAVENOUS
  Filled 2019-05-08: qty 50

## 2019-05-08 NOTE — Progress Notes (Signed)
PROGRESS NOTE    Tiffany Turner  ZOX:[id number] DOB: September 01, 1940 DOA: 05/03/2019 PCP: Patient, No Pcp Per   Brief Narrative:  Tiffany Turner is a 78 y.o. WF PMHx  advanced dementia, htn, CAD   presents with confusion over the last several days.  Pt with h/o freq utis.  Cannot provide any history due to her dementia.  All history obtained from records.  Found to be septic from uti with aki cr over 5 and covid positive.  Given vanc and azactam.    Subjective: 11/1 patient's eyes open, does not answer questions or follow commands.  RN states earlier this a.m. patient stated that she was cold when asked, still would not follow commands.   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)   Acute renal failure (HCC)   Acute lower UTI   Dehydration with hypernatremia   Acute metabolic encephalopathy   COVID-19 virus infection   Pressure injury of skin   Hypernatremia   Hyperchloremia   Bacterial conjunctivitis   Alzheimer's dementia (HCC)   Protein-calorie malnutrition, moderate (HCC)   Metabolic acidosis  Covid 19 virus infection -Negative respiratory symptoms  -Placed on continuous O2 monitoring -10/29 PCXR bibasilar opacification see results below -Decadron 6 mg daily -Remdesivir per pharmacy protocol -Titrate O2 to maintain SPO2> 88% -Combivent QID -Flutter valve -Vitamins per Covid protocol COVID-19 Labs  Recent Labs    05/06/19 0039 05/07/19 0224 05/08/19 0154  DDIMER 2.55* 2.15* 1.78*  FERRITIN 879* 701* 593*  CRP 2.5* 3.2* 1.7*  -10/29 SARS coronavirus positive  Proteus Mirabilis UTI -On admission patient does not meet criteria for sepsis. -Treat for 5-day course.  Acute renal failure? -Patient's last creatinine 05/01/2018   0.80, unsure of how much of this change is acute given patient's clinical  picture, suspect chronic component. -Per admission note family does not desire HD. Recent Labs  Lab 05/04/19 1805 05/05/19 0000 05/06/19 0039 05/07/19 0224 05/08/19 0154  CREATININE 1.78* 1.68* 1.16* 0.99 0.77  -Avoid nephrotoxic medication -Creatinine improving with hydration.  Severe metabolic derangement -Apparent patient has not been eating or drinking appropriately for at least weeks.  Will attempt to correct underlying metabolic abnormalities.  Metabolic acidosis -1 amp of Nabicarb  Hypernatremia/hyperchloremia -Secondary to severe dehydration -10/31 D5W-0.9% saline 86ml/hr.  Beginning to become hyponatremic -11/1 Free water TID attempting to achieve a balance in Na levels  Essential HTN -Stable off BP medication  Alzheimer's dementia -Unsure of baseline currently.  Increase response to painful stimuli after 24 hours of hydration.    Acute metabolic encephalopathy -Multifactorial severe metabolic derangement secondary to severe dehydration and malnourishment, infection -Correct underlying causes    Bacterial Conjunctivitis -Erythromycin ointment x7 days  Hypokalemia -Potassium goal> 4 -Potassium p.o. 60 mEq -Recheck K/Mg   Hypomagnesmia -Magnesium goal> 2 -Magnesium IV 2 g   Protein calorie malnutrition moderate have been attempting to contact son since admission unsuccessfully will insert NG tube, medical necessity in order to provide nutrition for patient. -10/31-second time consulted nutrition for enteral feeds  -10/31 Albumin 25 g      DVT prophylaxis: Lovenox Code Status: DNR Family Communication: 11/1 Spoke to call [name] (son) counseled him on plan of care and answered all questions.  Disposition Plan: TBD   Consultants:    Procedures/Significant Events:  PCXR 10/29;-Stable bibasilar opacities are noted concerning for pneumonia or  atelectasis.    I have personally reviewed and interpreted all radiology studies and my findings  are as above.  VENTILATOR SETTINGS:    Cultures 10/26 urine positive PROTEUS MIRABILIS  10/26 blood LEFT hand NGTD 10/26 blood RIGHT wrist NGTD 10/29 SARS coronavirus positive    Antimicrobials: Anti-infectives (From admission, onward)   Start     Stop   05/04/19 1600  remdesivir 100 mg in sodium chloride 0.9 % 250 mL IVPB     05/08/19 1559   05/04/19 1300  fosfomycin (MONUROL) packet 3 g         05/04/19 1200  cefTRIAXone (ROCEPHIN) 1 g in sodium chloride 0.9 % 100 mL IVPB  Status:  Discontinued     05/04/19 1219   05/03/19 1600  ciprofloxacin (CIPRO) IVPB 200 mg  Status:  Discontinued     05/04/19 1148   05/03/19 1600  remdesivir 200 mg in sodium chloride 0.9 % 250 mL IVPB     05/03/19 1730   05/03/19 0600  aztreonam (AZACTAM) 0.5 g in dextrose 5 % 50 mL IVPB  Status:  Discontinued     05/03/19 0815   05/03/19 0048  vancomycin variable dose per unstable renal function (pharmacist dosing)  Status:  Discontinued     05/03/19 0815       Devices    LINES / TUBES:      Continuous Infusions:  albumin human     cefTRIAXone (ROCEPHIN)  IV Stopped (05/07/19 1901)   dextrose 5 % and 0.9% NaCl 75 mL/hr at 05/08/19 0419     Objective: Vitals:   05/07/19 1900 05/08/19 0000 05/08/19 0047 05/08/19 0745  BP: (!) 142/81 (!) 147/62 136/65 (!) 137/57  Pulse: 71 73  67  Resp: 15 18 17  (!) 23  Temp: (!) 96.8 F (36 C)  98.2 F (36.8 C) (!) 96.5 F (35.8 C)  TempSrc: Axillary  Axillary Axillary  SpO2: 100% 95% (!) 70% 93%  Weight:      Height:        Intake/Output Summary (Last 24 hours) at 05/08/2019 0854 Last data filed at 05/08/2019 0300 Gross per 24 hour  Intake 884.83 ml  Output 1000 ml  Net -115.17 ml   Filed Weights   05/03/19 0015  Weight: 57.8 kg  Physical Exam:  General: Eyes open, does not follow commands, did not speak, minimal withdrawal to painful stimuli, no acute respiratory distress Eyes: negative scleral hemorrhage, negative anisocoria,  negative icterus discharge from eyes now more serous than purulent RIGHT>> LEFT ENT: Negative Runny nose, negative gingival bleeding, mucosa continues to be dry Neck:  Negative scars, masses, torticollis, lymphadenopathy, JVD Lungs: Clear to auscultation bilaterally without wheezes or crackles Cardiovascular: Regular rate and rhythm without murmur gallop or rub normal S1 and S2 Abdomen: negative abdominal pain, nondistended, positive soft, bowel sounds, no rebound, no ascites, no appreciable mass Extremities: No significant cyanosis, clubbing, or edema bilateral lower extremities Skin: Negative rashes, lesions, ulcers Psychiatric: Unable to evaluate secondary to altered mental status Central nervous system: Eyes open, does not follow commands, minimal withdrawal to painful stimuli.    .     Data Reviewed: Care during the described time interval was provided by me .  I have reviewed this patient's available data, including medical history, events of note, physical examination, and all test results as part of my evaluation.   CBC: Recent Labs  Lab 05/04/19 0600 05/05/19 0000 05/06/19 0039 05/07/19 0224 05/08/19 0154  WBC 21.3* 18.3* 15.7* 15.8*  16.8*  NEUTROABS 19.3* 16.8* 13.4* 14.0* 15.1*  HGB 10.1* 9.5* 9.7* 9.5* 9.2*  HCT 33.2* 30.6* 30.5* 29.4* 28.7*  MCV 109.9* 107.4* 104.1* 102.1* 102.5*  PLT 347 341 327 319 289   Basic Metabolic Panel: Recent Labs  Lab 05/04/19 0625  05/04/19 1805 05/05/19 0000 05/06/19 0039 05/07/19 0224 05/08/19 0154  NA  --    < > 161* 157* 144 139 146*  K  --    < > 4.4 4.1 3.2* 3.9 2.9*  CL  --    < > >130* 129* 116* 112* 116*  CO2  --    < > 20* 18* 18* 16* 20*  GLUCOSE  --    < > 223* 272* 232* 219* 125*  BUN  --    < > 113* 100* 66* 47* 35*  CREATININE  --    < > 1.78* 1.68* 1.16* 0.99 0.77  CALCIUM  --    < > 8.5* 8.4* 7.7* 7.5* 7.6*  MG 2.4  --   --  2.1 1.4* 1.8 1.8  PHOS  --   --   --  2.3* 2.5 2.5 2.6   < > = values in this  interval not displayed.   GFR: Estimated Creatinine Clearance: 49.2 mL/min (by C-G formula based on SCr of 0.77 mg/dL). Liver Function Tests: Recent Labs  Lab 05/04/19 0600 05/05/19 0000 05/06/19 0039 05/07/19 0224 05/08/19 0154  AST 66* 73* 77* 78* 59*  ALT 41 38 40 42 38  ALKPHOS 104 87 82 83 70  BILITOT 0.5 0.8 0.4 0.7 0.3  PROT 6.8 6.5 6.2* 6.0* 6.1*  ALBUMIN 2.2* 2.1* 1.9* 1.9* 2.5*   No results for input(s): LIPASE, AMYLASE in the last 168 hours. No results for input(s): AMMONIA in the last 168 hours. Coagulation Profile: No results for input(s): INR, PROTIME in the last 168 hours. Cardiac Enzymes: No results for input(s): CKTOTAL, CKMB, CKMBINDEX, TROPONINI in the last 168 hours. BNP (last 3 results) No results for input(s): PROBNP in the last 8760 hours. HbA1C: No results for input(s): HGBA1C in the last 72 hours. CBG: No results for input(s): GLUCAP in the last 168 hours. Lipid Profile: No results for input(s): CHOL, HDL, LDLCALC, TRIG, CHOLHDL, LDLDIRECT in the last 72 hours. Thyroid Function Tests: No results for input(s): TSH, T4TOTAL, FREET4, T3FREE, THYROIDAB in the last 72 hours. Anemia Panel: Recent Labs    05/07/19 0224 05/08/19 0154  FERRITIN 701* 593*   Urine analysis:    Component Value Date/Time   COLORURINE AMBER (A) 05/02/2019 0826   APPEARANCEUR TURBID (A) 05/02/2019 0826   APPEARANCEUR Turbid 07/29/2014 1548   LABSPEC 1.020 05/02/2019 0826   LABSPEC 1.018 07/29/2014 1548   PHURINE 7.0 05/02/2019 0826   GLUCOSEU NEGATIVE 05/02/2019 0826   GLUCOSEU Negative 07/29/2014 1548   HGBUR LARGE (A) 05/02/2019 0826   BILIRUBINUR NEGATIVE 05/02/2019 0826   KETONESUR NEGATIVE 05/02/2019 0826   PROTEINUR >=300 (A) 05/02/2019 0826   NITRITE NEGATIVE 05/02/2019 0826   LEUKOCYTESUR MODERATE (A) 05/02/2019 0826   LEUKOCYTESUR 3+ 07/29/2014 1548   Sepsis Labs: (procalcitonin:4,lacticidven:4)  ) Recent Results (from the past 240 hour(s))   Urine culture     Status: Abnormal   Collection Time: 05/02/19  8:26 AM   Specimen: In/Out Cath Urine  Result Value Ref Range Status   Specimen Description   Final    IN/OUT CATH URINE Performed at [facility], [address], [place], [place] [address]    Special  Requests   Final    NONE Performed at [facility], [address] [place], [place] [address]    Culture >=100,000 COLONIES/mL PROTEUS MIRABILIS (A)  Final   Report Status 05/04/2019 FINAL  Final   Organism ID, Bacteria PROTEUS MIRABILIS (A)  Final      Susceptibility   Proteus mirabilis - MIC*    AMPICILLIN >=32 RESISTANT Resistant     CEFAZOLIN <=4 SENSITIVE Sensitive     CEFTRIAXONE <=1 SENSITIVE Sensitive     CIPROFLOXACIN >=4 RESISTANT Resistant     GENTAMICIN <=1 SENSITIVE Sensitive     IMIPENEM 1 SENSITIVE Sensitive     NITROFURANTOIN 256 RESISTANT Resistant     TRIMETH/SULFA >=320 RESISTANT Resistant     AMPICILLIN/SULBACTAM <=2 SENSITIVE Sensitive     PIP/TAZO <=4 SENSITIVE Sensitive     * >=100,000 COLONIES/mL PROTEUS MIRABILIS  Blood culture (routine x 2)     Status: None   Collection Time: 05/02/19  8:35 AM   Specimen: BLOOD  Result Value Ref Range Status   Specimen Description BLOOD LEFT HAND  Final   Special Requests   Final    BOTTLES DRAWN AEROBIC AND ANAEROBIC Blood Culture adequate volume   Culture   Final    NO GROWTH 5 DAYS Performed at [facility], [address] [place], [place] [address]    Report Status 05/07/2019 FINAL  Final  Blood culture (routine x 2)     Status: None   Collection Time: 05/02/19  8:35 AM   Specimen: BLOOD  Result Value Ref Range Status   Specimen Description BLOOD RIGHT WRIST  Final   Special Requests   Final    BOTTLES DRAWN AEROBIC AND ANAEROBIC Blood Culture results may not be optimal due to an excessive volume of blood received in culture bottles   Culture   Final    NO GROWTH 5 DAYS Performed at [facility], [address] [place], [place] [address]    Report Status 05/07/2019 FINAL  Final  MRSA PCR Screening     Status: None   Collection Time: 05/05/19 11:45 AM   Specimen: Nasal Mucosa; Nasopharyngeal  Result Value Ref Range Status   MRSA by PCR NEGATIVE NEGATIVE Final    Comment:        The GeneXpert MRSA Assay (FDA approved for NASAL specimens only), is one component of a comprehensive MRSA colonization surveillance program. It is not intended to diagnose MRSA infection nor to guide or monitor treatment for MRSA infections. Performed at [facility], [address], [place], [place] [address]   SARS CORONAVIRUS 2 (TAT 6-24 HRS) Nasopharyngeal Nasopharyngeal Swab     Status: Abnormal   Collection Time: 05/05/19  8:18 PM   Specimen: Nasopharyngeal Swab  Result Value Ref Range Status   SARS Coronavirus 2 POSITIVE (A) NEGATIVE Final    Comment: RESULT CALLED TO, READ BACK BY AND VERIFIED WITH: S.JOHNSEN RN [id number] 05/06/2019 MCCORMICK K (NOTE) SARS-CoV-2 target nucleic acids are DETECTED. The SARS-CoV-2 RNA is generally detectable in upper and lower respiratory specimens during the acute phase of infection. Positive results are indicative of active infection with SARS-CoV-2. Clinical  correlation with patient history and other diagnostic information is necessary to determine patient infection status. Positive results do  not rule out bacterial infection or co-infection with other viruses. The expected result is Negative. Fact Sheet for Patients: HairSlick.no Fact Sheet for Healthcare Providers: quierodirigir.com This test is not yet approved or cleared by  the [place] FDA and  has been authorized for detection and/or diagnosis of SARS-CoV-2 by FDA under an Emergency Use Authorization (EUA). This EReliant EnergyUA will remain  in effect (meaning this test can be used)  for the duration of the COVID-19 declaration under  Section 564(b)(1) of the Act, 21 U.S.C. section 360bbb-3(b)(1), unless the authorization is terminated or revoked sooner. Performed at [facility], [address] [place], [place] [address]          Radiology Studies: Dg Chest Port 1 View  Result Date: 05/07/2019 CLINICAL DATA:  Seven 78-year-old female with NG tube placement. EXAM: PORTABLE CHEST 1 VIEW COMPARISON:  Chest radiograph dated 05/06/2019 FINDINGS: An enteric tube is noted with side port chest distal to the GE junction and tip in the left hemiabdomen likely in the proximal stomach. The tube can be further advanced by approximately 3-5 cm for optimal positioning. Bibasilar interstitial coarsening and nodularity, likely atelectasis or scarring. Pneumonia is not excluded. Clinical correlation is recommended. No focal consolidation, pleural effusion, or pneumothorax. Top-normal cardiac silhouette. Atherosclerotic calcification of the aortic arch. No acute osseous pathology. IMPRESSION: 1. Enteric tube with tip in the proximal stomach. 2. Bibasilar interstitial coarsening and nodularity, likely atelectasis or scarring. Pneumonia is not excluded. Electronically Signed   By: Elgie CollardArash  Radparvar M.D.   On: 05/07/2019 02:18   Dg Chest Port 1 View  Result Date: 05/06/2019 CLINICAL DATA:  NG tube placement EXAM: PORTABLE CHEST 1 VIEW COMPARISON:  Radiograph 05/05/2019 FINDINGS: The NG tube appears coiled within the proximal thoracic esophagus with the catheter kinked at the level of side port. Bibasilar opacities concerning for atelectasis or pneumonia are similar to comparison exam. No pneumothorax or visible effusion. The aorta is calcified. The remaining cardiomediastinal contours are unremarkable. No acute osseous or soft tissue abnormality. IMPRESSION: 1. The NG tube appears coiled within the proximal thoracic esophagus with the catheter kinked at the level of side port. 2. Bibasilar opacities concerning for atelectasis or pneumonia,  similar to comparison exam. These results will be called to the ordering clinician or representative by the Radiologist Assistant, and communication documented in the PACS or zVision Dashboard. Electronically Signed   By: Kreg ShropshirePrice  DeHay M.D.   On: 05/06/2019 19:36        Scheduled Meds:  dexamethasone (DECADRON) injection  6 mg Intravenous Q24H   enoxaparin (LOVENOX) injection  40 mg Subcutaneous Q24H   erythromycin   Both Eyes Q6H   Ipratropium-Albuterol  1 puff Inhalation QID   mouth rinse  15 mL Mouth Rinse BID   vitamin C  500 mg Oral Daily   zinc sulfate  220 mg Oral Daily   Continuous Infusions:  albumin human     cefTRIAXone (ROCEPHIN)  IV Stopped (05/07/19 1901)   dextrose 5 % and 0.9% NaCl 75 mL/hr at 05/08/19 0419     LOS: 5 days   The patient is critically ill with multiple organ systems failure and requires high complexity decision making for assessment and support, frequent evaluation and titration of therapies, application of advanced monitoring technologies and extensive interpretation of multiple databases. Critical Care Time devoted to patient care services described in this note  Time spent: 40 minutes     Tamiya Colello, Roselind MessierURTIS J, MD [facility] Pager [phone]  If 7PM-7AM, please contact night-coverage [email or web address] Password [email or web address] 05/08/2019, 8:54 AM

## 2019-05-08 NOTE — Progress Notes (Signed)
Initial Nutrition Assessment  INTERVENTION:   Monitor magnesium, potassium, and phosphorus daily for at least 3 days, MD to replete as needed, as pt is at risk for refeeding syndrome.  -Initiate Osmolite 1.2 @ 20 ml/hr via NGT advance by 10 ml every 12 hours to goal rate of 55 ml/hr. -30 ml Prostat BID -Continue free water flushes of 100 ml every 8 hours -Tube feeding regimen will provide 1784 kcal, 103g protein and 1382 ml H2O.   Patient on diet, receiving Hormel Shake w/ breakfast and Magic Cups BID with lunch/dinner meals.  NUTRITION DIAGNOSIS:   Increased nutrient needs related to acute illness as evidenced by estimated needs.  GOAL:   Patient will meet greater than or equal to 90% of their needs  MONITOR:   Labs, TF tolerance, Weight trends, Skin, I & O's  REASON FOR ASSESSMENT:   Consult Assessment of nutrition requirement/status(enteral feeds)  ASSESSMENT:   78 y.o. female with medical history significant of advanced dementia, htn, CAD presents with confusion over the last several days.  Pt with h/o freq utis.  Cannot provide any history due to her dementia.  All history obtained from records.  Found to be septic from uti with aki cr over 5 and covid positive.  10/27: admitted, COVID-19 positive 10/31: NGT placed  **RD working remotely**  Pt with advanced dementia and unable to provide history. Pt is total care. Per chart review, pt has not eaten well for weeks. Has not eaten at all this admission. Per medication history, pt is supplemented with Ensure and Prostat at nursing facility.   Given poor PO intakes and suspected malnutrition, recommend monitoring for refeeding syndrome once enteral feeds are initiated.  Per weight records, pt weighed 134 lbs in October 2019. Current weight 127 lbs. Insignificant weight loss for time frame.   I/Os: +4350 ml since admit UOP: 1L x 24 hrs  Medications: Free water 100 ml every 8 hours Labs reviewed: Elevated Na Low K Glu  125 Mg/Phos WNL  NUTRITION - FOCUSED PHYSICAL EXAM:  Unable to perform -working remotely.  Diet Order:   Diet Order            Diet Heart Room service appropriate? Yes; Fluid consistency: Thin  Diet effective now              EDUCATION NEEDS:   Not appropriate for education at this time  Skin:  Skin Assessment: Skin Integrity Issues: Skin Integrity Issues:: DTI, Stage I DTI: left ankle, right heel, sacrum Stage I: right buttocks  Last BM:  11/1 -type 7  Height:   Ht Readings from Last 1 Encounters:  05/03/19 5\' 4"  (1.626 m)    Weight:   Wt Readings from Last 1 Encounters:  05/03/19 57.8 kg    Ideal Body Weight:  54.5 kg  BMI:  Body mass index is 21.87 kg/m.  Estimated Nutritional Needs:   Kcal:  6073-7106  Protein:  90-100g  Fluid:  1.8L/day  Clayton Bibles, MS, RD, LDN Inpatient Clinical Dietitian Pager: [phone] After Hours Pager: [phone]

## 2019-05-08 NOTE — Progress Notes (Signed)
Updated and talked to son Gerald Stabs was able to answer any questions he had at this time.

## 2019-05-09 ENCOUNTER — Inpatient Hospital Stay (HOSPITAL_COMMUNITY): Payer: Medicare Other

## 2019-05-09 DIAGNOSIS — D72829 Elevated white blood cell count, unspecified: Secondary | ICD-10-CM

## 2019-05-09 LAB — GLUCOSE, CAPILLARY
Glucose-Capillary: 126 mg/dL — ABNORMAL HIGH (ref 70–99)
Glucose-Capillary: 115 mg/dL — ABNORMAL HIGH (ref 70–99)
Glucose-Capillary: 162 mg/dL — ABNORMAL HIGH (ref 70–99)
Glucose-Capillary: 169 mg/dL — ABNORMAL HIGH (ref 70–99)
Glucose-Capillary: 169 mg/dL — ABNORMAL HIGH (ref 70–99)

## 2019-05-09 LAB — COMPREHENSIVE METABOLIC PANEL
ALT: 36 U/L (ref 0–44)
AST: 45 U/L — ABNORMAL HIGH (ref 15–41)
Albumin: 2.5 g/dL — ABNORMAL LOW (ref 3.5–5.0)
Alkaline Phosphatase: 96 U/L (ref 38–126)
Anion gap: 9 (ref 5–15)
BUN: 37 mg/dL — ABNORMAL HIGH (ref 8–23)
CO2: 19 mmol/L — ABNORMAL LOW (ref 22–32)
Calcium: 8.2 mg/dL — ABNORMAL LOW (ref 8.9–10.3)
Chloride: 118 mmol/L — ABNORMAL HIGH (ref 98–111)
Creatinine, Ser: 0.81 mg/dL (ref 0.44–1.00)
GFR calc Af Amer: >60 mL/min (ref >60)
GFR calc non Af Amer: >60 mL/min (ref >60)
Glucose, Bld: 103 mg/dL — ABNORMAL HIGH (ref 70–99)
Potassium: 4.4 mmol/L (ref 3.5–5.1)
Sodium: 146 mmol/L — ABNORMAL HIGH (ref 135–145)
Total Bilirubin: 0.3 mg/dL (ref 0.3–1.2)
Total Protein: 6.3 g/dL — ABNORMAL LOW (ref 6.5–8.1)

## 2019-05-09 LAB — PHOSPHORUS: Phosphorus: 3.2 mg/dL (ref 2.5–4.6)

## 2019-05-09 LAB — CBC WITH DIFFERENTIAL/PLATELET
Abs Immature Granulocytes: 0.84 10*3/uL — ABNORMAL HIGH (ref 0.00–0.07)
Basophils Absolute: 0.1 10*3/uL (ref 0.0–0.1)
Basophils Relative: 0 %
Eosinophils Absolute: 0 10*3/uL (ref 0.0–0.5)
Eosinophils Relative: 0 %
HCT: 32.2 % — ABNORMAL LOW (ref 36.0–46.0)
Hemoglobin: 10 g/dL — ABNORMAL LOW (ref 12.0–15.0)
Immature Granulocytes: 4 %
Lymphocytes Relative: 4 %
Lymphs Abs: 1.1 10*3/uL (ref 0.7–4.0)
MCH: 33.7 pg (ref 26.0–34.0)
MCHC: 31.1 g/dL (ref 30.0–36.0)
MCV: 108.4 fL — ABNORMAL HIGH (ref 80.0–100.0)
Monocytes Absolute: 0.7 10*3/uL (ref 0.1–1.0)
Monocytes Relative: 3 %
Neutro Abs: 21.5 10*3/uL — ABNORMAL HIGH (ref 1.7–7.7)
Neutrophils Relative %: 89 %
Platelets: 351 10*3/uL (ref 150–400)
RBC: 2.97 MIL/uL — ABNORMAL LOW (ref 3.87–5.11)
RDW: 13.5 % (ref 11.5–15.5)
WBC: 24.2 10*3/uL — ABNORMAL HIGH (ref 4.0–10.5)
nRBC: 0.1 % (ref 0.0–0.2)

## 2019-05-09 LAB — FERRITIN: Ferritin: 538 ng/mL — ABNORMAL HIGH (ref 11–307)

## 2019-05-09 LAB — C-REACTIVE PROTEIN: CRP: 1.4 mg/dL — ABNORMAL HIGH (ref <1.0)

## 2019-05-09 LAB — MAGNESIUM: Magnesium: 2.2 mg/dL (ref 1.7–2.4)

## 2019-05-09 LAB — HEMOGLOBIN A1C
Hgb A1c MFr Bld: 6.8 % — ABNORMAL HIGH (ref 4.8–5.6)
Mean Plasma Glucose: 148.46 mg/dL

## 2019-05-09 LAB — LACTATE DEHYDROGENASE: LDH: 415 U/L — ABNORMAL HIGH (ref 98–192)

## 2019-05-09 LAB — D-DIMER, QUANTITATIVE: D-Dimer, Quant: 2.17 ug/mL-FEU — ABNORMAL HIGH (ref 0.00–0.50)

## 2019-05-09 MED ORDER — FREE WATER
200.0000 mL | Freq: Three times a day (TID) | Status: DC
  Administered 2019-05-10 – 2019-05-14 (×14): 200 mL

## 2019-05-09 NOTE — Progress Notes (Signed)
05/09/19 at [place] RT spoke with RN about ABG ordered on pt. Pt with increased WOB due to pain otherwise no distress.Pt able to inform staff of when in pain. Pt SpO2 remains 95% and greater on room air. RT secure messaged Dr. Sherral Hammers to notify him of the above and that an ABG at this time is ian nvasive procedure when not currently indiicated.   05/09/19 at 1910 RT has not heard from MD. Shift change currently. Report given to nightshift RT and will await further instruction.

## 2019-05-09 NOTE — Progress Notes (Signed)
PROGRESS NOTE    Tiffany Turner  ZOX:[id number] DOB: 1940-10-23 DOA: 05/03/2019 PCP: Patient, No Pcp Per   Brief Narrative:  Tiffany Turner is a 78 y.o. WF PMHx  advanced dementia, htn, CAD   presents with confusion over the last several days.  Pt with h/o freq utis.  Cannot provide any history due to her dementia.  All history obtained from records.  Found to be septic from uti with aki cr over 5 and covid positive.  Given vanc and azactam.    Subjective: 11/2 unresponsive.  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)   Acute renal failure (HCC)   Acute lower UTI   Dehydration with hypernatremia   Acute metabolic encephalopathy   COVID-19 virus infection   Pressure injury of skin   Hypernatremia   Hyperchloremia   Bacterial conjunctivitis   Alzheimer's dementia (HCC)   Protein-calorie malnutrition, moderate (HCC)   Metabolic acidosis  Covid 19 virus infection -Negative respiratory symptoms  -Placed on continuous O2 monitoring -10/29 PCXR bibasilar opacification see results below -Decadron 6 mg daily -Remdesivir per pharmacy protocol -Titrate O2 to maintain SPO2> 88% -Combivent QID -Flutter valve -Vitamins per Covid protocol COVID-19 Labs  Recent Labs    05/07/19 0224 05/08/19 0154 05/09/19 0531  DDIMER 2.15* 1.78*  --   FERRITIN 701* 593* 538*  CRP 3.2* 1.7*  --   -10/29 SARS coronavirus positive  Proteus Mirabilis UTI -On admission patient does not meet criteria for sepsis. -Treat for 5-day course.  Acute renal failure? -Patient's last creatinine 05/01/2018   0.80, unsure of how much of this change is acute given patient's clinical picture, suspect chronic component. -Per admission note family does not desire HD. Recent Labs  Lab 05/04/19 1805 05/05/19  0000 05/06/19 0039 05/07/19 0224 05/08/19 0154  CREATININE 1.78* 1.68* 1.16* 0.99 0.77  -Avoid nephrotoxic medication -Creatinine improving with hydration.  Severe metabolic derangement -Apparent patient has not been eating or drinking appropriately for at least weeks.  Will attempt to correct underlying metabolic abnormalities.  Metabolic acidosis -1 amp of Na-bicarb  Hypernatremia/hyperchloremia -Secondary to severe dehydration -10/31 D5W-0.9% saline 17ml/hr.  Beginning to become hyponatremic -11/2 increase free water TID   Essential HTN -Stable off BP medication  Alzheimer's dementia -Unsure of baseline currently.  Increase response to painful stimuli after 24 hours of hydration.    Acute metabolic encephalopathy -Multifactorial severe metabolic derangement secondary to severe dehydration and malnourishment, infection -Correct underlying causes    Bacterial Conjunctivitis -Erythromycin ointment x7 days  Hypokalemia -Potassium goal> 4  Hypomagnesmia -Magnesium goal> 2  Leukocytosis -Patient afebrile, negative left shift, negative bands most likely secondary to steroids   Protein calorie malnutrition moderate have been attempting to contact son since admission unsuccessfully will insert NG tube, medical necessity in order to provide nutrition for patient. -10/31-second time consulted nutrition for enteral feeds  -10/31 Albumin 25 g      DVT prophylaxis: Lovenox Code Status: DNR Family Communication: 11/1 Spoke to call [name] (son) counseled him on plan of care and answered all questions.  Disposition Plan: TBD   Consultants:    Procedures/Significant Events:  PCXR 10/29;-Stable bibasilar opacities are noted concerning for pneumonia or atelectasis.    I have personally reviewed and interpreted all radiology studies and my findings are as above.  VENTILATOR  SETTINGS:    Cultures 10/26 urine positive PROTEUS MIRABILIS  10/26 blood LEFT hand  NGTD 10/26 blood RIGHT wrist NGTD 10/29 SARS coronavirus positive    Antimicrobials: Anti-infectives (From admission, onward)   Start     Stop   05/04/19 1700  cefTRIAXone (ROCEPHIN) 1 g in sodium chloride 0.9 % 100 mL IVPB     05/08/19 1832   05/04/19 1600  remdesivir 100 mg in sodium chloride 0.9 % 250 mL IVPB     05/07/19 1602   05/04/19 1300  fosfomycin (MONUROL) packet 3 g  Status:  Discontinued     05/04/19 1605   05/04/19 1200  cefTRIAXone (ROCEPHIN) 1 g in sodium chloride 0.9 % 100 mL IVPB  Status:  Discontinued     05/04/19 1219   05/03/19 1600  ciprofloxacin (CIPRO) IVPB 200 mg  Status:  Discontinued     05/04/19 1148   05/03/19 1600  remdesivir 200 mg in sodium chloride 0.9 % 250 mL IVPB     05/03/19 1730   05/03/19 0600  aztreonam (AZACTAM) 0.5 g in dextrose 5 % 50 mL IVPB  Status:  Discontinued     05/03/19 0815   05/03/19 0048  vancomycin variable dose per unstable renal function (pharmacist dosing)  Status:  Discontinued     05/03/19 0815       Devices    LINES / TUBES:      Continuous Infusions: . albumin human    . dextrose 5 % and 0.9% NaCl 75 mL/hr at 05/09/19 0000  . feeding supplement (OSMOLITE 1.2 CAL) 30 mL/hr at 05/09/19 0453     Objective: Vitals:   05/09/19 0502 05/09/19 0525 05/09/19 0600 05/09/19 0800  BP:  (!) 149/43  (!) 135/48  Pulse: 65 60 63 66  Resp: 18 17 18 15   Temp:    98 F (36.7 C)  TempSrc:    Axillary  SpO2: 95% 93% 94% 94%  Weight:      Height:        Intake/Output Summary (Last 24 hours) at 05/09/2019 0837 Last data filed at 05/09/2019 0612 Gross per 24 hour  Intake 2847.84 ml  Output 950 ml  Net 1897.84 ml   Filed Weights   05/03/19 0015 05/09/19 0459  Weight: 57.8 kg 58.1 kg   Physical Exam:  General: Unresponsive, except to grimacing to painful stimuli.  No acute respiratory distress Eyes: negative scleral hemorrhage, negative anisocoria, negative icterus ENT: Negative Runny nose, negative gingival  bleeding, bilateral eye discharge appears to have resolved Neck:  Negative scars, masses, torticollis, lymphadenopathy, JVD Lungs: Clear to auscultation bilaterally without wheezes or crackles Cardiovascular: Regular rate and rhythm without murmur gallop or rub normal S1 and S2 Abdomen: negative abdominal pain, nondistended, positive soft, bowel sounds, no rebound, no ascites, no appreciable mass Extremities: No significant cyanosis, clubbing, or edema bilateral lower extremities, left arm beginning to have an reducible contracture Skin: Negative rashes, lesions, ulcers Psychiatric: Unable to evaluate Central nervous system: Mild grimacing to painful stimuli.  Otherwise noninteractive     .     Data Reviewed: Care during the described time interval was provided by me .  I have reviewed this patient's available data, including medical history, events of note, physical examination, and all test results as part of my evaluation.   CBC: Recent Labs  Lab 05/05/19 0000 05/06/19 0039 05/07/19 0224 05/08/19 0154 05/09/19 0536  WBC 18.3* 15.7* 15.8* 16.8* 24.2*  NEUTROABS 16.8* 13.4* 14.0* 15.1* 21.5*  HGB  9.5* 9.7* 9.5* 9.2* 10.0*  HCT 30.6* 30.5* 29.4* 28.7* 32.2*  MCV 107.4* 104.1* 102.1* 102.5* 108.4*  PLT 341 327 319 289 351   Basic Metabolic Panel: Recent Labs  Lab 05/04/19 1805 05/05/19 0000 05/06/19 0039 05/07/19 0224 05/08/19 0154 05/09/19 0530  NA 161* 157* 144 139 146*  --   K 4.4 4.1 3.2* 3.9 2.9*  --   CL >130* 129* 116* 112* 116*  --   CO2 20* 18* 18* 16* 20*  --   GLUCOSE 223* 272* 232* 219* 125*  --   BUN 113* 100* 66* 47* 35*  --   CREATININE 1.78* 1.68* 1.16* 0.99 0.77  --   CALCIUM 8.5* 8.4* 7.7* 7.5* 7.6*  --   MG  --  2.1 1.4* 1.8 1.8 2.2  PHOS  --  2.3* 2.5 2.5 2.6 3.2   GFR: Estimated Creatinine Clearance: 49.2 mL/min (by C-G formula based on SCr of 0.77 mg/dL). Liver Function Tests: Recent Labs  Lab 05/04/19 0600 05/05/19 0000 05/06/19 0039  05/07/19 0224 05/08/19 0154  AST 66* 73* 77* 78* 59*  ALT 41 38 40 42 38  ALKPHOS 104 87 82 83 70  BILITOT 0.5 0.8 0.4 0.7 0.3  PROT 6.8 6.5 6.2* 6.0* 6.1*  ALBUMIN 2.2* 2.1* 1.9* 1.9* 2.5*   No results for input(s): LIPASE, AMYLASE in the last 168 hours. No results for input(s): AMMONIA in the last 168 hours. Coagulation Profile: No results for input(s): INR, PROTIME in the last 168 hours. Cardiac Enzymes: No results for input(s): CKTOTAL, CKMB, CKMBINDEX, TROPONINI in the last 168 hours. BNP (last 3 results) No results for input(s): PROBNP in the last 8760 hours. HbA1C: Recent Labs    05/08/19 0156  HGBA1C 6.8*   CBG: Recent Labs  Lab 05/08/19 1524 05/08/19 2014 05/08/19 2254 05/09/19 0502 05/09/19 0808  GLUCAP 244* 205* 161* 126* 115*   Lipid Profile: No results for input(s): CHOL, HDL, LDLCALC, TRIG, CHOLHDL, LDLDIRECT in the last 72 hours. Thyroid Function Tests: No results for input(s): TSH, T4TOTAL, FREET4, T3FREE, THYROIDAB in the last 72 hours. Anemia Panel: Recent Labs    05/08/19 0154 05/09/19 0531  FERRITIN 593* 538*   Urine analysis:    Component Value Date/Time   COLORURINE AMBER (A) 05/02/2019 0826   APPEARANCEUR TURBID (A) 05/02/2019 0826   APPEARANCEUR Turbid 07/29/2014 1548   LABSPEC 1.020 05/02/2019 0826   LABSPEC 1.018 07/29/2014 1548   PHURINE 7.0 05/02/2019 0826   GLUCOSEU NEGATIVE 05/02/2019 0826   GLUCOSEU Negative 07/29/2014 1548   HGBUR LARGE (A) 05/02/2019 0826   BILIRUBINUR NEGATIVE 05/02/2019 0826   KETONESUR NEGATIVE 05/02/2019 0826   PROTEINUR >=300 (A) 05/02/2019 0826   NITRITE NEGATIVE 05/02/2019 0826   LEUKOCYTESUR MODERATE (A) 05/02/2019 0826   LEUKOCYTESUR 3+ 07/29/2014 1548   Sepsis Labs: (procalcitonin:4,lacticidven:4)  ) Recent Results (from the past 240 hour(s))  Urine culture     Status: Abnormal   Collection Time: 05/02/19  8:26 AM   Specimen: In/Out Cath Urine  Result Value Ref Range Status    Specimen Description   Final    IN/OUT CATH URINE Performed at [facility], [address], [place], [place] [address]    Special Requests   Final    NONE Performed at [facility], [address] [place], [place] [address]    Culture >=100,000 COLONIES/mL PROTEUS MIRABILIS (A)  Final   Report Status 05/04/2019 FINAL  Final   Organism ID, Bacteria PROTEUS MIRABILIS (A)  Final      Susceptibility   Proteus mirabilis - MIC*    AMPICILLIN >=32 RESISTANT Resistant     CEFAZOLIN <=4 SENSITIVE Sensitive     CEFTRIAXONE <=1 SENSITIVE Sensitive     CIPROFLOXACIN >=4 RESISTANT Resistant     GENTAMICIN <=1 SENSITIVE Sensitive     IMIPENEM 1 SENSITIVE Sensitive     NITROFURANTOIN 256 RESISTANT Resistant     TRIMETH/SULFA >=320 RESISTANT Resistant     AMPICILLIN/SULBACTAM <=2 SENSITIVE Sensitive     PIP/TAZO <=4 SENSITIVE Sensitive     * >=100,000 COLONIES/mL PROTEUS MIRABILIS  Blood culture (routine x 2)     Status: None   Collection Time: 05/02/19  8:35 AM   Specimen: BLOOD  Result Value Ref Range Status   Specimen Description BLOOD LEFT HAND  Final   Special Requests   Final    BOTTLES DRAWN AEROBIC AND ANAEROBIC Blood Culture adequate volume   Culture   Final    NO GROWTH 5 DAYS Performed at [facility], [address] [place], [place] [address]    Report Status 05/07/2019 FINAL  Final  Blood culture (routine x 2)     Status: None   Collection Time: 05/02/19  8:35 AM   Specimen: BLOOD  Result Value Ref Range Status   Specimen Description BLOOD RIGHT WRIST  Final   Special Requests   Final    BOTTLES DRAWN AEROBIC AND ANAEROBIC Blood Culture results may not be optimal due to an excessive volume of blood received in culture bottles   Culture   Final    NO GROWTH 5 DAYS Performed at [facility], [address] [place], [place] [address]    Report Status 05/07/2019 FINAL  Final  MRSA PCR Screening     Status: None   Collection Time:  05/05/19 11:45 AM   Specimen: Nasal Mucosa; Nasopharyngeal  Result Value Ref Range Status   MRSA by PCR NEGATIVE NEGATIVE Final    Comment:        The GeneXpert MRSA Assay (FDA approved for NASAL specimens only), is one component of a comprehensive MRSA colonization surveillance program. It is not intended to diagnose MRSA infection nor to guide or monitor treatment for MRSA infections. Performed at [facility], [place] [address] [place], [place] [address]   SARS CORONAVIRUS 2 (TAT 6-24 HRS) Nasopharyngeal Nasopharyngeal Swab     Status: Abnormal   Collection Time: 05/05/19  8:18 PM   Specimen: Nasopharyngeal Swab  Result Value Ref Range Status   SARS Coronavirus 2 POSITIVE (A) NEGATIVE Final    Comment: RESULT CALLED TO, READ BACK BY AND VERIFIED WITH: S.JOHNSEN RN [id number] 05/06/2019 MCCORMICK K (NOTE) SARS-CoV-2 target nucleic acids are DETECTED. The SARS-CoV-2 RNA is generally detectable in upper and lower respiratory specimens during the acute phase of infection. Positive results are indicative of active infection with SARS-CoV-2. Clinical  correlation with patient history and other diagnostic information is necessary to determine patient infection status. Positive results do  not rule out bacterial infection or co-infection with other viruses. The expected result is Negative. Fact Sheet for Patients: SugarRoll.be Fact Sheet for Healthcare Providers: https://www.-mathews.com/ This test is not yet approved or cleared by the [place] FDA and  has been authorized for detection and/or diagnosis of SARS-CoV-2 by FDA under an Emergency Use Authorization (EUA). This EUA will remain  in effect (meaning this test can be used)  for the duration of the COVID-19 declaration under Section 564(b)(1) of  the Act, 21 U.S.C. section 360bbb-3(b)(1), unless the authorization is terminated or revoked sooner. Performed at  [facility], [address] [place], [place] [address]          Radiology Studies: No results found.      Scheduled Meds: . dexamethasone (DECADRON) injection  6 mg Intravenous Q24H  . enoxaparin (LOVENOX) injection  40 mg Subcutaneous Q24H  . erythromycin   Both Eyes Q6H  . feeding supplement (PRO-STAT SUGAR FREE 64)  30 mL Per Tube BID  . free water  100 mL Per Tube Q8H  . insulin aspart  0-9 Units Subcutaneous Q4H  . Ipratropium-Albuterol  1 puff Inhalation QID  . mouth rinse  15 mL Mouth Rinse BID  . vitamin C  500 mg Oral Daily  . zinc sulfate  220 mg Oral Daily   Continuous Infusions: . albumin human    . dextrose 5 % and 0.9% NaCl 75 mL/hr at 05/09/19 0000  . feeding supplement (OSMOLITE 1.2 CAL) 30 mL/hr at 05/09/19 0453     LOS: 6 days   The patient is critically ill with multiple organ systems failure and requires high complexity decision making for assessment and support, frequent evaluation and titration of therapies, application of advanced monitoring technologies and extensive interpretation of multiple databases. Critical Care Time devoted to patient care services described in this note  Time spent: 40 minutes     Journe Hallmark, Roselind MessierURTIS J, MD [facility] Pager [phone]  If 7PM-7AM, please contact night-coverage [email or web address] Password [email or web address] 05/09/2019, 8:37 AM

## 2019-05-09 NOTE — Plan of Care (Signed)
Discussed in front of patient with her son on the phone plan of care for the evening, pain management and her current vital signs with some teach back displayed.  Patient was able to listen to the son on the phone saying he loved her and good night for know. Son had no further questions an was informed CT results would be discussed with MD but no red flags at this time.

## 2019-05-10 LAB — COMPREHENSIVE METABOLIC PANEL
ALT: 31 U/L (ref 0–44)
AST: 36 U/L (ref 15–41)
Albumin: 2.1 g/dL — ABNORMAL LOW (ref 3.5–5.0)
Alkaline Phosphatase: 79 U/L (ref 38–126)
Anion gap: 6 (ref 5–15)
BUN: 40 mg/dL — ABNORMAL HIGH (ref 8–23)
CO2: 20 mmol/L — ABNORMAL LOW (ref 22–32)
Calcium: 7.9 mg/dL — ABNORMAL LOW (ref 8.9–10.3)
Chloride: 118 mmol/L — ABNORMAL HIGH (ref 98–111)
Creatinine, Ser: 0.72 mg/dL (ref 0.44–1.00)
GFR calc Af Amer: >60 mL/min (ref >60)
GFR calc non Af Amer: >60 mL/min (ref >60)
Glucose, Bld: 147 mg/dL — ABNORMAL HIGH (ref 70–99)
Potassium: 4.3 mmol/L (ref 3.5–5.1)
Sodium: 144 mmol/L (ref 135–145)
Total Bilirubin: 0.4 mg/dL (ref 0.3–1.2)
Total Protein: 5.6 g/dL — ABNORMAL LOW (ref 6.5–8.1)

## 2019-05-10 LAB — CBC WITH DIFFERENTIAL/PLATELET
Abs Immature Granulocytes: 0.59 10*3/uL — ABNORMAL HIGH (ref 0.00–0.07)
Basophils Absolute: 0.1 10*3/uL (ref 0.0–0.1)
Basophils Relative: 0 %
Eosinophils Absolute: 0 10*3/uL (ref 0.0–0.5)
Eosinophils Relative: 0 %
HCT: 28.5 % — ABNORMAL LOW (ref 36.0–46.0)
Hemoglobin: 8.8 g/dL — ABNORMAL LOW (ref 12.0–15.0)
Immature Granulocytes: 3 %
Lymphocytes Relative: 6 %
Lymphs Abs: 1.1 10*3/uL (ref 0.7–4.0)
MCH: 33 pg (ref 26.0–34.0)
MCHC: 30.9 g/dL (ref 30.0–36.0)
MCV: 106.7 fL — ABNORMAL HIGH (ref 80.0–100.0)
Monocytes Absolute: 0.4 10*3/uL (ref 0.1–1.0)
Monocytes Relative: 2 %
Neutro Abs: 16.1 10*3/uL — ABNORMAL HIGH (ref 1.7–7.7)
Neutrophils Relative %: 89 %
Platelets: 324 10*3/uL (ref 150–400)
RBC: 2.67 MIL/uL — ABNORMAL LOW (ref 3.87–5.11)
RDW: 13.9 % (ref 11.5–15.5)
WBC: 18.3 10*3/uL — ABNORMAL HIGH (ref 4.0–10.5)
nRBC: 0.1 % (ref 0.0–0.2)

## 2019-05-10 LAB — LACTATE DEHYDROGENASE: LDH: 338 U/L — ABNORMAL HIGH (ref 98–192)

## 2019-05-10 LAB — GLUCOSE, CAPILLARY
Glucose-Capillary: 121 mg/dL — ABNORMAL HIGH (ref 70–99)
Glucose-Capillary: 135 mg/dL — ABNORMAL HIGH (ref 70–99)
Glucose-Capillary: 154 mg/dL — ABNORMAL HIGH (ref 70–99)
Glucose-Capillary: 164 mg/dL — ABNORMAL HIGH (ref 70–99)
Glucose-Capillary: 174 mg/dL — ABNORMAL HIGH (ref 70–99)
Glucose-Capillary: 186 mg/dL — ABNORMAL HIGH (ref 70–99)
Glucose-Capillary: 205 mg/dL — ABNORMAL HIGH (ref 70–99)

## 2019-05-10 LAB — MAGNESIUM: Magnesium: 1.6 mg/dL — ABNORMAL LOW (ref 1.7–2.4)

## 2019-05-10 LAB — PHOSPHORUS: Phosphorus: 2.4 mg/dL — ABNORMAL LOW (ref 2.5–4.6)

## 2019-05-10 LAB — D-DIMER, QUANTITATIVE: D-Dimer, Quant: 2.11 ug/mL-FEU — ABNORMAL HIGH (ref 0.00–0.50)

## 2019-05-10 LAB — C-REACTIVE PROTEIN: CRP: 1.2 mg/dL — ABNORMAL HIGH (ref <1.0)

## 2019-05-10 LAB — FERRITIN: Ferritin: 398 ng/mL — ABNORMAL HIGH (ref 11–307)

## 2019-05-10 MED ORDER — KETOROLAC TROMETHAMINE 15 MG/ML IJ SOLN
15.0000 mg | Freq: Three times a day (TID) | INTRAMUSCULAR | Status: AC | PRN
  Administered 2019-05-10 – 2019-05-13 (×5): 15 mg via INTRAVENOUS
  Filled 2019-05-10 (×7): qty 1

## 2019-05-10 MED ORDER — ALBUMIN HUMAN 25 % IV SOLN
25.0000 g | Freq: Once | INTRAVENOUS | Status: AC
  Administered 2019-05-10: 25 g via INTRAVENOUS
  Filled 2019-05-10: qty 50

## 2019-05-10 MED ORDER — MAGNESIUM SULFATE 2 GM/50ML IV SOLN
2.0000 g | Freq: Once | INTRAVENOUS | Status: AC
  Administered 2019-05-10: 2 g via INTRAVENOUS
  Filled 2019-05-10: qty 50

## 2019-05-10 NOTE — Plan of Care (Signed)
  Problem: Education: Goal: Knowledge of risk factors and measures for prevention of condition will improve Outcome: Progressing   Problem: Coping: Goal: Psychosocial and spiritual needs will be supported Outcome: Progressing   Problem: Respiratory: Goal: Will maintain a patent airway Outcome: Progressing Goal: Complications related to the disease process, condition or treatment will be avoided or minimized Outcome: Progressing   Problem: Education: Goal: Knowledge of General Education information will improve Description: Including pain rating scale, medication(s)/side effects and non-pharmacologic comfort measures Outcome: Progressing   Problem: Health Behavior/Discharge Planning: Goal: Ability to manage health-related needs will improve Outcome: Progressing   Problem: Clinical Measurements: Goal: Ability to maintain clinical measurements within normal limits will improve Outcome: Progressing Goal: Will remain free from infection Outcome: Progressing Goal: Diagnostic test results will improve Outcome: Progressing Goal: Respiratory complications will improve Outcome: Progressing Goal: Cardiovascular complication will be avoided Outcome: Progressing   Problem: Activity: Goal: Risk for activity intolerance will decrease Outcome: Progressing   Problem: Nutrition: Goal: Adequate nutrition will be maintained Outcome: Progressing   Problem: Coping: Goal: Level of anxiety will decrease Outcome: Progressing   Problem: Elimination: Goal: Will not experience complications related to bowel motility Outcome: Progressing Goal: Will not experience complications related to urinary retention Outcome: Progressing   Problem: Pain Managment: Goal: General experience of comfort will improve Outcome: Progressing   Problem: Safety: Goal: Ability to remain free from injury will improve Outcome: Progressing   Problem: Skin Integrity: Goal: Risk for impaired skin integrity will  decrease Outcome: Progressing   Problem: Urinary Elimination: Goal: Signs and symptoms of infection will decrease Outcome: Progressing

## 2019-05-10 NOTE — Progress Notes (Signed)
The patient's son Gerald Stabs called. I gave him an update on the patient's condition and answered all of his questions. He denied any other needs at this time.

## 2019-05-10 NOTE — Care Management (Signed)
Patient resides in [place]. ED CM called the APS hotline for [facility] at  [phone]. CM spoke with someone who states they will have the on-call SW call back. CM contact information provided.   ED CM received a call back from [place], [place] [facility] [place] in [facility] APS report completed. Apolonio Schneiders was provided with the ED CSW telephone number for [facility] to use if she has any additional questions.

## 2019-05-10 NOTE — Progress Notes (Signed)
PROGRESS NOTE    TEKESHIA KLAHR  BJY:[id number] DOB: 02-Jul-1941 DOA: 05/03/2019 PCP: Patient, No Pcp Per   Brief Narrative:  Tiffany Turner is a 78 y.o. WF PMHx  advanced dementia, htn, CAD   presents with confusion over the last several days.  Pt with h/o freq utis.  Cannot provide any history due to her dementia.  All history obtained from records.  Found to be septic from uti with aki cr over 5 and covid positive.  Given vanc and azactam.    Subjective: 11/3 last 24 hours afebrile, eyes open, states not in pain.  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)   Acute renal failure (HCC)   Acute lower UTI   Dehydration with hypernatremia   Acute metabolic encephalopathy   COVID-19 virus infection   Pressure injury of skin   Hypernatremia   Hyperchloremia   Bacterial conjunctivitis   Alzheimer's dementia (HCC)   Protein-calorie malnutrition, moderate (HCC)   Metabolic acidosis  Covid 19 virus infection -Negative respiratory symptoms  -Placed on continuous O2 monitoring -10/29 PCXR bibasilar opacification see results below -Decadron 6 mg daily -Remdesivir per pharmacy protocol -Titrate O2 to maintain SPO2> 88% -Combivent QID -Flutter valve -Vitamins per Covid protocol COVID-19 Labs  Recent Labs    05/08/19 0154 05/09/19 0531 05/09/19 0536 05/10/19 0415  DDIMER 1.78*  --  2.17* 2.11*  FERRITIN 593* 538*  --  398*  LDH  --   --  415* 338*  CRP 1.7*  --  1.4* 1.2*  -10/29 SARS coronavirus positive  Proteus Mirabilis UTI -On admission patient does not meet criteria for sepsis. -Turner for 5-day course.  Acute renal failure? -Patient's last creatinine 05/01/2018   0.80, unsure of how much of this change is acute given patient's clinical picture, suspect chronic component. -Per  admission note family does not desire HD. Recent Labs  Lab 05/06/19 0039 05/07/19 0224 05/08/19 0154 05/09/19 0536 05/10/19 0415  CREATININE 1.16* 0.99 0.77 0.81 0.72  -Avoid nephrotoxic medication -Creatinine improving with hydration.  Severe metabolic derangement -Apparent patient has not been eating or drinking appropriately for at least weeks.  Will attempt to correct underlying metabolic abnormalities.  Metabolic acidosis -1 amp of Na-bicarb  Hypernatremia/hyperchloremia -Secondary to severe dehydration -10/31 D5W-0.9% saline 73ml/hr.  Beginning to become hyponatremic -11/2 increase free water TID   Essential HTN -Stable off BP medication  Alzheimer's dementia -Unsure of baseline currently.  Increase response to painful stimuli after 24 hours of hydration.    Acute metabolic encephalopathy -Multifactorial severe metabolic derangement secondary to severe dehydration and malnourishment, infection -Correct underlying causes -11/3 CT W0 head; negative for acute findings see results below    Bacterial Conjunctivitis -Erythromycin ointment x7 days  Hypokalemia -Potassium goal> 4  Hypomagnesmia -Magnesium goal> 2 -Magnesium IV 2 g  Leukocytosis -Patient afebrile, negative left shift, negative bands most likely secondary to steroids   Protein calorie malnutrition moderate  -Have been attempting to contact son since admission unsuccessfully will insert NG tube, medical necessity in order to provide nutrition for patient. -10/31-second time consulted nutrition for enteral feeds  -10/31 Albumin 25 g -11/3 Albumin 25 g; will help with edema  Pain management -11/3 order to nursing staff; they have been quick to want to use narcotics in this frail older woman so the following  orders have been placed.   Please do not use benzodiazepines or narcotics on this patient unless other means have been exhausted.  Positioning of patient, heating/cooling packs to any areas of  discomfort, Tylenol, or Toradol.  Goals of care -11/3 attempting to engage Adult Protective Services and to case secondary to Apparent patient has not been eating or drinking appropriately for at least weeks.  11/3 spoke with NCM Crystal, who will consult with LCSW Sophire and will ensure Adult Protective Services are informed of this case.  I informed son that had called casein today and was waiting to hear back from APS.    DVT prophylaxis: Lovenox Code Status: DNR Family Communication: 11/3 Spoke to call [name] (son) counseled him on plan of care and answered all questions.  Disposition Plan: TBD   Consultants:    Procedures/Significant Events:  PCXR 10/29;-Stable bibasilar opacities are noted concerning for pneumonia or atelectasis.  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 -No acute intracranial abnormality. Specifically, no hemorrhage, hydrocephalus, mass lesion, acute infarction, or significant intracranial injury.  -atrophy and chronic small vessel disease changes.    I have personally reviewed and interpreted all radiology studies and my findings are as above.  VENTILATOR SETTINGS:    Cultures 10/26 urine positive PROTEUS MIRABILIS  10/26 blood LEFT hand NGTD 10/26 blood RIGHT wrist NGTD 10/29 SARS coronavirus positive    Antimicrobials: Anti-infectives (From admission, onward)   Start     Stop   05/04/19 1700  cefTRIAXone (ROCEPHIN) 1 g in sodium chloride 0.9 % 100 mL IVPB     05/08/19 1832   05/04/19 1600  remdesivir 100 mg in sodium chloride 0.9 % 250 mL IVPB     05/07/19 1602   05/04/19 1300  fosfomycin (MONUROL) packet 3 g  Status:  Discontinued     05/04/19 1605   05/04/19 1200  cefTRIAXone (ROCEPHIN) 1 g in sodium chloride 0.9 % 100 mL IVPB  Status:  Discontinued     05/04/19 1219   05/03/19 1600  ciprofloxacin (CIPRO) IVPB 200 mg  Status:  Discontinued     05/04/19 1148   05/03/19 1600   remdesivir 200 mg in sodium chloride 0.9 % 250 mL IVPB     05/03/19 1730   05/03/19 0600  aztreonam (AZACTAM) 0.5 g in dextrose 5 % 50 mL IVPB  Status:  Discontinued     05/03/19 0815   05/03/19 0048  vancomycin variable dose per unstable renal function (pharmacist dosing)  Status:  Discontinued     05/03/19 0815       Devices    LINES / TUBES:      Continuous Infusions:  albumin human     dextrose 5 % and 0.9% NaCl 75 mL/hr at 05/10/19 0600   feeding supplement (OSMOLITE 1.2 CAL) 50 mL/hr at 05/10/19 0809     Objective: Vitals:   05/10/19 0500 05/10/19 0600 05/10/19 0603 05/10/19 0802  BP:  (!) 127/54  106/66  Pulse: 67 65 71 90  Resp: Temp:    97.8 F (36.6 C)  TempSrc:    Axillary  SpO2: 96% 95% 95% 96%  Weight:      Height:        Intake/Output Summary (Last 24 hours) at 05/10/2019 0834 Last data filed at 05/10/2019 0600 Gross per 24 hour  Intake 3318.23 ml  Output 800 ml  Net 2518.23 ml   Filed Weights   05/03/19 0015  05/09/19 0459 05/10/19 0400  Weight: 57.8 kg 58.1 kg 62.3 kg   Physical Exam:  General: Eyes open, stated not in pain, follow no other directions answered no other questions, acute respiratory distress Eyes: negative scleral hemorrhage, negative anisocoria, negative icterus ENT: Negative Runny nose, negative gingival bleeding, Neck:  Negative scars, masses, torticollis, lymphadenopathy, JVD Lungs: Clear to auscultation bilaterally without wheezes or crackles Cardiovascular: Regular rate and rhythm without murmur gallop or rub normal S1 and S2 Abdomen: negative abdominal pain, nondistended, positive soft, bowel sounds, no rebound, no ascites, no appreciable mass Extremities: No significant cyanosis, clubbing, or positive (+) 1-2 edema bilateral extremities Skin: Negative rashes, lesions, ulcers Psychiatric: Not able to assess secondary to altered mental status Central nervous system: Follows no commands, answers only 1  question, eyes open      .     Data Reviewed: Care during the described time interval was provided by me .  I have reviewed this patient's available data, including medical history, events of note, physical examination, and all test results as part of my evaluation.   CBC: Recent Labs  Lab 05/06/19 0039 05/07/19 0224 05/08/19 0154 05/09/19 0536 05/10/19 0415  WBC 15.7* 15.8* 16.8* 24.2* 18.3*  NEUTROABS 13.4* 14.0* 15.1* 21.5* 16.1*  HGB 9.7* 9.5* 9.2* 10.0* 8.8*  HCT 30.5* 29.4* 28.7* 32.2* 28.5*  MCV 104.1* 102.1* 102.5* 108.4* 106.7*  PLT 327 319 289 351 324   Basic Metabolic Panel: Recent Labs  Lab 05/06/19 0039 05/07/19 0224 05/08/19 0154 05/09/19 0530 05/09/19 0536 05/10/19 0415  NA 144 139 146*  --  146* 144  K 3.2* 3.9 2.9*  --  4.4 4.3  CL 116* 112* 116*  --  118* 118*  CO2 18* 16* 20*  --  19* 20*  GLUCOSE 232* 219* 125*  --  103* 147*  BUN 66* 47* 35*  --  37* 40*  CREATININE 1.16* 0.99 0.77  --  0.81 0.72  CALCIUM 7.7* 7.5* 7.6*  --  8.2* 7.9*  MG 1.4* 1.8 1.8 2.2  --  1.6*  PHOS 2.5 2.5 2.6 3.2  --  2.4*   GFR: Estimated Creatinine Clearance: 49.2 mL/min (by C-G formula based on SCr of 0.72 mg/dL). Liver Function Tests: Recent Labs  Lab 05/06/19 0039 05/07/19 0224 05/08/19 0154 05/09/19 0536 05/10/19 0415  AST 77* 78* 59* 45* 36  ALT 40 42 38 36 31  ALKPHOS 82 83 70 96 79  BILITOT 0.4 0.7 0.3 0.3 0.4  PROT 6.2* 6.0* 6.1* 6.3* 5.6*  ALBUMIN 1.9* 1.9* 2.5* 2.5* 2.1*   No results for input(s): LIPASE, AMYLASE in the last 168 hours. No results for input(s): AMMONIA in the last 168 hours. Coagulation Profile: No results for input(s): INR, PROTIME in the last 168 hours. Cardiac Enzymes: No results for input(s): CKTOTAL, CKMB, CKMBINDEX, TROPONINI in the last 168 hours. BNP (last 3 results) No results for input(s): PROBNP in the last 8760 hours. HbA1C: Recent Labs    05/08/19 0156  HGBA1C 6.8*   CBG: Recent Labs  Lab 05/09/19 1629  05/09/19 2022 05/09/19 2357 05/10/19 0548 05/10/19 0800  GLUCAP 169* 169* 154* 164* 135*   Lipid Profile: No results for input(s): CHOL, HDL, LDLCALC, TRIG, CHOLHDL, LDLDIRECT in the last 72 hours. Thyroid Function Tests: No results for input(s): TSH, T4TOTAL, FREET4, T3FREE, THYROIDAB in the last 72 hours. Anemia Panel: Recent Labs    05/09/19 0531 05/10/19 0415  FERRITIN 538* 398*   Urine analysis:  Component Value Date/Time   COLORURINE AMBER (A) 05/02/2019 0826   APPEARANCEUR TURBID (A) 05/02/2019 0826   APPEARANCEUR Turbid 07/29/2014 1548   LABSPEC 1.020 05/02/2019 0826   LABSPEC 1.018 07/29/2014 1548   PHURINE 7.0 05/02/2019 0826   GLUCOSEU NEGATIVE 05/02/2019 0826   GLUCOSEU Negative 07/29/2014 1548   HGBUR LARGE (A) 05/02/2019 0826   BILIRUBINUR NEGATIVE 05/02/2019 0826   KETONESUR NEGATIVE 05/02/2019 0826   PROTEINUR >=300 (A) 05/02/2019 0826   NITRITE NEGATIVE 05/02/2019 0826   LEUKOCYTESUR MODERATE (A) 05/02/2019 0826   LEUKOCYTESUR 3+ 07/29/2014 1548   Sepsis Labs: @LABRCNTIP (procalcitonin:4,lacticidven:4)  ) Recent Results (from the past 240 hour(s))  Urine culture     Status: Abnormal   Collection Time: 05/02/19  8:26 AM   Specimen: In/Out Cath Urine  Result Value Ref Range Status   Specimen Description   Final    IN/OUT CATH URINE Performed at [facility], [address], [place], [place] [address]    Special Requests   Final    NONE Performed at [facility], [address], [place], [place] [address]    Culture >=100,000 COLONIES/mL PROTEUS MIRABILIS (A)  Final   Report Status 05/04/2019 FINAL  Final   Organism ID, Bacteria PROTEUS MIRABILIS (A)  Final      Susceptibility   Proteus mirabilis - MIC*    AMPICILLIN >=32 RESISTANT Resistant     CEFAZOLIN <=4 SENSITIVE Sensitive     CEFTRIAXONE <=1 SENSITIVE Sensitive     CIPROFLOXACIN >=4 RESISTANT Resistant     GENTAMICIN <=1 SENSITIVE Sensitive     IMIPENEM 1  SENSITIVE Sensitive     NITROFURANTOIN 256 RESISTANT Resistant     TRIMETH/SULFA >=320 RESISTANT Resistant     AMPICILLIN/SULBACTAM <=2 SENSITIVE Sensitive     PIP/TAZO <=4 SENSITIVE Sensitive     * >=100,000 COLONIES/mL PROTEUS MIRABILIS  Blood culture (routine x 2)     Status: None   Collection Time: 05/02/19  8:35 AM   Specimen: BLOOD  Result Value Ref Range Status   Specimen Description BLOOD LEFT HAND  Final   Special Requests   Final    BOTTLES DRAWN AEROBIC AND ANAEROBIC Blood Culture adequate volume   Culture   Final    NO GROWTH 5 DAYS Performed at [facility], [address] [place], [place] [address]    Report Status 05/07/2019 FINAL  Final  Blood culture (routine x 2)     Status: None   Collection Time: 05/02/19  8:35 AM   Specimen: BLOOD  Result Value Ref Range Status   Specimen Description BLOOD RIGHT WRIST  Final   Special Requests   Final    BOTTLES DRAWN AEROBIC AND ANAEROBIC Blood Culture results may not be optimal due to an excessive volume of blood received in culture bottles   Culture   Final    NO GROWTH 5 DAYS Performed at [facility], [address] [place],  [address]    Report Status 05/07/2019 FINAL  Final  MRSA PCR Screening     Status: None   Collection Time: 05/05/19 11:45 AM   Specimen: Nasal Mucosa; Nasopharyngeal  Result Value Ref Range Status   MRSA by PCR NEGATIVE NEGATIVE Final    Comment:        The GeneXpert MRSA Assay (FDA approved for NASAL specimens only), is one component of a comprehensive MRSA colonization surveillance program. It is not intended to diagnose MRSA infection nor to guide or monitor treatment  for MRSA infections. Performed at [facility], [address] [place], [place] [address]   SARS CORONAVIRUS 2 (TAT 6-24 HRS) [facility]opharyngeal [facility] Swab     Status: Abnormal   Collection Time: 05/05/19  8:18 PM   Specimen: Nasopharyngeal Swab  Result Value Ref  Range Status   SARS Coronavirus 2 POSITIVE (A) NEGATIVE Final    Comment: RESULT CALLED TO, READ BACK BY AND VERIFIED WITH: S.JOHNSEN RN 1755 05/06/2019 MCCORMICK K (NOTE) SARS-CoV-2 target nucleic acids are DETECTED. The SARS-CoV-2 RNA is generally detectable in upper and lower respiratory specimens during the acute phase of infection. Positive results are indicative of active infection with SARS-CoV-2. Clinical  correlation with patient history and other diagnostic information is necessary to determine patient infection status. Positive results do  not rule out bacterial infection or co-infection with other viruses. The expected result is Negative. Fact Sheet for Patients: HairSlick.nohttps://www.fda.gov/media/138098/download Fact Sheet for Healthcare Providers: quierodirigir.comhttps://www.fda.gov/media/138095/download This test is not yet approved or cleared by the [place] FDA and  has been authorized for detection and/or diagnosis of SARS-CoV-2 by FDA under an Emergency Use Authorization (EUA). This EUA will remain  in effect (meaning this test can be used)  for the duration of the COVID-19 declaration under Section 564(b)(1) of the Act, 21 U.S.C. section 360bbb-3(b)(1), unless the authorization is terminated or revoked sooner. Performed at [facility], [address] [place], [place] [address]          Radiology Studies: Ct Head Wo Contrast  Result Date: 05/09/2019 CLINICAL DATA:  Altered mental status EXAM: CT HEAD WITHOUT CONTRAST TECHNIQUE: Contiguous axial images were obtained from the base of the skull through the vertex without intravenous contrast. 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 No acute intracranial abnormality. Specifically, no hemorrhage, hydrocephalus, mass lesion, acute infarction, or significant intracranial injury. There is atrophy and chronic small vessel disease changes. Vascular: No hyperdense  vessel or unexpected calcification. Skull: Postsurgical changes in the frontal bones. No acute calvarial abnormality. 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 Atrophy, chronic microvascular disease. No acute intracranial abnormality. Electronically Signed   By: Charlett NoseKevin  Dover M.D.   On: 05/09/2019 20:24        Scheduled Meds:  dexamethasone (DECADRON) injection  6 mg Intravenous Q24H   enoxaparin (LOVENOX) injection  40 mg Subcutaneous Q24H   erythromycin   Both Eyes Q6H   feeding supplement (PRO-STAT SUGAR FREE 64)  30 mL Per Tube BID   free water  200 mL Per Tube Q8H   insulin aspart  0-9 Units Subcutaneous Q4H   Ipratropium-Albuterol  1 puff Inhalation QID   mouth rinse  15 mL Mouth Rinse BID   vitamin C  500 mg Oral Daily   zinc sulfate  220 mg Oral Daily   Continuous Infusions:  albumin human     dextrose 5 % and 0.9% NaCl 75 mL/hr at 05/10/19 0600   feeding supplement (OSMOLITE 1.2 CAL) 50 mL/hr at 05/10/19 0809     LOS: 7 days   The patient is critically ill with multiple organ systems failure and requires high complexity decision making for assessment and support, frequent evaluation and titration of therapies, application of advanced monitoring technologies and extensive interpretation of multiple databases. Critical Care Time devoted to patient care services described in this note  Time spent: 40 minutes     Jak Haggar, Roselind MessierURTIS J, MD [facility] Pager  [phone]  If 7PM-7AM, [phone] night-coverage [email or web address] Password [facility] 05/10/2019, 8:34 AM

## 2019-05-11 DIAGNOSIS — G9341 Metabolic encephalopathy: Secondary | ICD-10-CM

## 2019-05-11 DIAGNOSIS — N179 Acute kidney failure, unspecified: Secondary | ICD-10-CM

## 2019-05-11 LAB — COMPREHENSIVE METABOLIC PANEL
ALT: 29 U/L (ref 0–44)
AST: 32 U/L (ref 15–41)
Albumin: 2.5 g/dL — ABNORMAL LOW (ref 3.5–5.0)
Alkaline Phosphatase: 67 U/L (ref 38–126)
Anion gap: 7 (ref 5–15)
BUN: 44 mg/dL — ABNORMAL HIGH (ref 8–23)
CO2: 21 mmol/L — ABNORMAL LOW (ref 22–32)
Calcium: 7.6 mg/dL — ABNORMAL LOW (ref 8.9–10.3)
Chloride: 109 mmol/L (ref 98–111)
Creatinine, Ser: 0.61 mg/dL (ref 0.44–1.00)
GFR calc Af Amer: >60 mL/min (ref >60)
GFR calc non Af Amer: >60 mL/min (ref >60)
Glucose, Bld: 169 mg/dL — ABNORMAL HIGH (ref 70–99)
Potassium: 4.7 mmol/L (ref 3.5–5.1)
Sodium: 137 mmol/L (ref 135–145)
Total Bilirubin: 0.8 mg/dL (ref 0.3–1.2)
Total Protein: 5.5 g/dL — ABNORMAL LOW (ref 6.5–8.1)

## 2019-05-11 LAB — CBC WITH DIFFERENTIAL/PLATELET
Abs Immature Granulocytes: 0.4 10*3/uL — ABNORMAL HIGH (ref 0.00–0.07)
Basophils Absolute: 0 10*3/uL (ref 0.0–0.1)
Basophils Relative: 0 %
Eosinophils Absolute: 0 10*3/uL (ref 0.0–0.5)
Eosinophils Relative: 0 %
HCT: 26.8 % — ABNORMAL LOW (ref 36.0–46.0)
Hemoglobin: 8.6 g/dL — ABNORMAL LOW (ref 12.0–15.0)
Immature Granulocytes: 2 %
Lymphocytes Relative: 7 %
Lymphs Abs: 1.3 10*3/uL (ref 0.7–4.0)
MCH: 33.6 pg (ref 26.0–34.0)
MCHC: 32.1 g/dL (ref 30.0–36.0)
MCV: 104.7 fL — ABNORMAL HIGH (ref 80.0–100.0)
Monocytes Absolute: 0.5 10*3/uL (ref 0.1–1.0)
Monocytes Relative: 3 %
Neutro Abs: 15.5 10*3/uL — ABNORMAL HIGH (ref 1.7–7.7)
Neutrophils Relative %: 88 %
Platelets: 282 10*3/uL (ref 150–400)
RBC: 2.56 MIL/uL — ABNORMAL LOW (ref 3.87–5.11)
RDW: 14 % (ref 11.5–15.5)
WBC: 17.8 10*3/uL — ABNORMAL HIGH (ref 4.0–10.5)
nRBC: 0.1 % (ref 0.0–0.2)

## 2019-05-11 LAB — GLUCOSE, CAPILLARY
Glucose-Capillary: 138 mg/dL — ABNORMAL HIGH (ref 70–99)
Glucose-Capillary: 155 mg/dL — ABNORMAL HIGH (ref 70–99)
Glucose-Capillary: 161 mg/dL — ABNORMAL HIGH (ref 70–99)
Glucose-Capillary: 162 mg/dL — ABNORMAL HIGH (ref 70–99)
Glucose-Capillary: 175 mg/dL — ABNORMAL HIGH (ref 70–99)
Glucose-Capillary: 204 mg/dL — ABNORMAL HIGH (ref 70–99)

## 2019-05-11 LAB — C-REACTIVE PROTEIN: CRP: 1.1 mg/dL — ABNORMAL HIGH (ref <1.0)

## 2019-05-11 LAB — MAGNESIUM: Magnesium: 1.8 mg/dL (ref 1.7–2.4)

## 2019-05-11 LAB — LACTATE DEHYDROGENASE: LDH: 313 U/L — ABNORMAL HIGH (ref 98–192)

## 2019-05-11 LAB — FERRITIN: Ferritin: 463 ng/mL — ABNORMAL HIGH (ref 11–307)

## 2019-05-11 LAB — D-DIMER, QUANTITATIVE: D-Dimer, Quant: 1.88 ug/mL-FEU — ABNORMAL HIGH (ref 0.00–0.50)

## 2019-05-11 LAB — PHOSPHORUS: Phosphorus: 1.8 mg/dL — ABNORMAL LOW (ref 2.5–4.6)

## 2019-05-11 MED ORDER — K PHOS MONO-SOD PHOS DI & MONO 155-852-130 MG PO TABS
250.0000 mg | ORAL_TABLET | Freq: Two times a day (BID) | ORAL | Status: AC
  Administered 2019-05-11 – 2019-05-12 (×4): 250 mg via ORAL
  Filled 2019-05-11 (×4): qty 1

## 2019-05-11 NOTE — Progress Notes (Signed)
Report received care assumed from Ruta Hinds RN. Pt resting in bed with call bell in reach NADN No needs identified. Will continue to monitor

## 2019-05-11 NOTE — Progress Notes (Signed)
Lab Results WBC  Date/Time Value Ref Range Status  05/11/2019 04:10 AM 17.8 (H) 4.0 - 10.5 K/uL Final  05/10/2019 04:15 AM 18.3 (H) 4.0 - 10.5 K/uL Final  05/09/2019 05:36 AM 24.2 (H) 4.0 - 10.5 K/uL Final   Neutrophils Relative %  Date/Time Value Ref Range Status  05/11/2019 04:10 AM 88 % Final  05/10/2019 04:15 AM 89 % Final  05/09/2019 05:36 AM 89 % Final   No results found for: PCO2ART Lactic Acid, Venous  Date/Time Value Ref Range Status  05/03/2019 04:40 AM 1.4 0.5 - 1.9 mmol/L Final    Comment:    Performed at [facility], [place] [address] [place], [place] [address]  05/02/2019 11:54 AM 1.4 0.5 - 1.9 mmol/L Final    Comment:    Performed at [facility], [address] [place], [place] [address]  05/02/2019 08:26 AM 2.7 (HH) 0.5 - 1.9 mmol/L Final    Comment:    CRITICAL RESULT CALLED TO, READ BACK BY AND VERIFIED WITH MARY NEEDHAM @0910  ON 05/02/2019 BY FMW Performed at [facility], [address] [place], [place] [address]    pCO2, Ven  Date/Time Value Ref Range Status  05/02/2019 08:35 AM 33 (L) 44.0 - 60.0 mmHg Final   Patient remains 2 on MEWS.  MD and charge aware.

## 2019-05-11 NOTE — Treatment Plan (Signed)
   Vital Signs MEWS/VS Documentation      05/11/2019 0000 05/11/2019 0200 05/11/2019 0357 05/11/2019 0400   MEWS Score:  2  2  2  2    MEWS Score Color:  Yellow  Yellow  Yellow  Yellow   Resp:  17  18  20  17    Pulse:  73  90  87  82   BP:  (!) 162/56  137/67  (!) 145/60  140/60   Temp:  -  -  98.7 F (37.1 C)  -   O2 Device:  -  -  Room Air  -           Tiffany Turner L Domitila Stetler 05/11/2019,4:15 AM

## 2019-05-11 NOTE — Progress Notes (Signed)
Lab Results WBC  Date/Time Value Ref Range Status  05/11/2019 04:10 AM 17.8 (H) 4.0 - 10.5 K/uL Final  05/10/2019 04:15 AM 18.3 (H) 4.0 - 10.5 K/uL Final  05/09/2019 05:36 AM 24.2 (H) 4.0 - 10.5 K/uL Final   Neutrophils Relative %  Date/Time Value Ref Range Status  05/11/2019 04:10 AM 88 % Final  05/10/2019 04:15 AM 89 % Final  05/09/2019 05:36 AM 89 % Final   No results found for: PCO2ART Lactic Acid, Venous  Date/Time Value Ref Range Status  05/03/2019 04:40 AM 1.4 0.5 - 1.9 mmol/L Final    Comment:    Performed at [facility], [place] [address], [place], [place] [address]  05/02/2019 11:54 AM 1.4 0.5 - 1.9 mmol/L Final    Comment:    Performed at [facility], [address] [place], [place] [address]  05/02/2019 08:26 AM 2.7 (HH) 0.5 - 1.9 mmol/L Final    Comment:    CRITICAL RESULT CALLED TO, READ BACK BY AND VERIFIED WITH MARY NEEDHAM @0910  ON 05/02/2019 BY FMW Performed at [facility], [address] [place], [place] [address]    pCO2, Ven  Date/Time Value Ref Range Status  05/02/2019 08:35 AM 33 (L) 44.0 - 60.0 mmHg Final   MEWS score remains yellow due to patient's LOC.  Charge nurse and MD aware.

## 2019-05-11 NOTE — Progress Notes (Signed)
Spoke with Gerald Stabs and gave an update on the patient.

## 2019-05-11 NOTE — Progress Notes (Signed)
CSW received phone call from [place], Otilio Connors [phone], regarding open report- she is to follow up with patients RN and complete video call with patient. APS will follow up with CSW.   Kingsley Spittle, LCSW [facility]  [phone]

## 2019-05-11 NOTE — Progress Notes (Addendum)
PROGRESS NOTE                                                                                                                                                                                                             Patient Demographics:    Tiffany Turner, is a 78 y.o. female, DOB - 09/10/1940, ZOX:[id number]  Admit date - 05/03/2019   Admitting Physician Tiffany Armsawood S Cully Luckow, MD  Outpatient Primary MD for the patient is Patient, No Pcp Per  LOS - 8   No chief complaint on file.      Brief Narrative    78 y.o. female with a known history of advanced dementia, hypertension, hyperlipidemia, coronary artery disease, previous history of urinary tract infection who presents to the hospital due to altered mental status, sent from facility to increased lethargy and confusion, apparently at baseline she is more communicative but was noted to be more lethargic, her work-up was significant for UTI, COVID-19 infection, and hyponatremia, [facility] further management.   Subjective:    Tiffany Turner today advanced to demented, unable to provide any complaints, no significant events as discussed with staff.   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 COVID-19 Labs  Recent Labs    05/09/19 0531 05/09/19 0536 05/10/19 0415 05/11/19 0410  DDIMER  --  2.17* 2.11* 1.88*  FERRITIN 538*  --  398* 463*  LDH  --  415* 338* 313*  CRP  --  1.4* 1.2* 1.1*    Lab Results  Component Value Date   SARSCOV2NAA POSITIVE (A) 05/05/2019   Sepsis Secondary to UTI -  patient meets criteria given her tachycardia, leukocytosis on admission. -Treated with IV Rocephin.  Altered mental status -To be acute on chronic process, apparently she is with underlying advanced dementia, vascular dementia, which is evidence on her imaging CT head, as well she is contracted on the left side, poor baseline. -This is likely worsened in the setting of acute process related to UTI, electrolyte abnormalities, renal failure and COVID-19 infection.  AKI -Significantly elevated 5.35 on presentation, this is most likely prerenal in the setting of sepsis and her oral intake, this has resolved with IV fluids.  Hypertension -Blood pressure acceptable, continue to monitor  Advanced dementia -Continue with supportive care  Failure to thrive  -Patient mains with very poor oral intake, currently on tube feeds via NGT.   COVID-19 Labs  Recent Labs    05/09/19 0531 05/09/19 0536 05/10/19 0415 05/11/19 0410  DDIMER  --  2.17* 2.11* 1.88*  FERRITIN 538*  --  398* 463*  LDH  --  415* 338* 313*  CRP  --  1.4* 1.2* 1.1*    Lab Results  Component Value Date   SARSCOV2NAA POSITIVE (A) 05/05/2019     Code Status : DNR  Family Communication  : D/W son today  Disposition Plan  : pending further work up.  Consults  : None    Procedures  : None  DVT Prophylaxis  : [place] lovenox  Lab Results  Component Value Date   PLT 282 05/11/2019    Antibiotics  :    Anti-infectives (From admission, onward)   Start     Dose/Rate Route Frequency Ordered Stop   05/04/19 1700  cefTRIAXone (ROCEPHIN) 1 g in sodium chloride 0.9 % 100 mL IVPB     1 g 200  mL/hr over 30 Minutes Intravenous Every 24 hours 05/04/19 1608 05/08/19 1832   05/04/19 1600  remdesivir 100 mg in sodium chloride 0.9 % 250 mL IVPB     100 mg 500 mL/hr over 30 Minutes Intravenous Every 24 hours 05/03/19 1508 05/07/19 1602   05/04/19 1300  fosfomycin (MONUROL) packet 3 g  Status:  Discontinued     3 g Oral  Once 05/04/19 1219 05/04/19 1605   05/04/19 1200  cefTRIAXone (ROCEPHIN) 1 g in sodium chloride 0.9 % 100 mL IVPB  Status:  Discontinued     1 g 200 mL/hr over 30 Minutes Intravenous Every 24 hours 05/04/19 1148 05/04/19 1219   05/03/19 1600  ciprofloxacin (CIPRO) IVPB 200 mg  Status:  Discontinued     200 mg 100 mL/hr over 60 Minutes Intravenous Every 24 hours 05/03/19 0826 05/04/19 1148   05/03/19 1600  remdesivir 200 mg in sodium chloride 0.9 % 250 mL IVPB     200 mg 500 mL/hr over 30 Minutes Intravenous Once 05/03/19 1508 05/03/19 1730   05/03/19 0600  aztreonam (AZACTAM) 0.5 g in dextrose 5 % 50 mL IVPB  Status:  Discontinued     0.5 g 100 mL/hr over 30 Minutes Intravenous Every 8 hours 05/03/19 0051 05/03/19 0815   05/03/19 0048  vancomycin variable dose per unstable renal function (pharmacist dosing)  Status:  Discontinued      Does not apply See admin instructions 05/03/19 0051 05/03/19 0815        Objective:   Vitals:   05/11/19 0357 05/11/19 0400 05/11/19 0747 05/11/19 1153  BP: (!) 145/60 140/60 134/83 127/70  Pulse: 87 82 82 78  Resp: (!) 21  Temp: 98.7 F (37.1  C)  98.6 F (37 C) 99.1 F (37.3 C)  TempSrc: Axillary  Axillary Axillary  SpO2: 100%  100% 99%  Weight:      Height:        Wt Readings from Last 3 Encounters:  05/10/19 62.3 kg  05/02/19 61 kg  04/29/18 61 kg     Intake/Output Summary (Last 24 hours) at 05/11/2019 1155 Last data filed at 05/11/2019 0700 Gross per 24 hour  Intake 3419.07 ml  Output 1900 ml  Net 1519.07 ml     Physical Exam  Patient is awake, her eye open, in no apparent distress, unable to  follow any commands or answer questions Symmetrical Chest wall movement, Good air movement bilaterally, CTAB RRR,No Gallops,Rubs or new Murmurs, No Parasternal Heave +ve B.Sounds, Abd Soft, No tenderness,No rebound - guarding or rigidity. No Cyanosis, Clubbing or edema, No new Rash or bruise  , left upper extremity contracted.   Data Review:    CBC Recent Labs  Lab 05/07/19 0224 05/08/19 0154 05/09/19 0536 05/10/19 0415 05/11/19 0410  WBC 15.8* 16.8* 24.2* 18.3* 17.8*  HGB 9.5* 9.2* 10.0* 8.8* 8.6*  HCT 29.4* 28.7* 32.2* 28.5* 26.8*  PLT 319 289 351 324 282  MCV 102.1* 102.5* 108.4* 106.7* 104.7*  MCH 33.0 32.9 33.7 33.0 33.6  MCHC 32.3 32.1 31.1 30.9 32.1  RDW 12.9 12.7 13.5 13.9 14.0  LYMPHSABS 1.1 0.9 1.1 1.1 1.3  MONOABS 0.4 0.4 0.7 0.4 0.5  EOSABS 0.0 0.0 0.0 0.0 0.0  BASOSABS 0.1 0.1 0.1 0.1 0.0    Chemistries  Recent Labs  Lab 05/07/19 0224 05/08/19 0154 05/09/19 0530 05/09/19 0536 05/10/19 0415 05/11/19 0410  NA 139 146*  --  146* 144 137  K 3.9 2.9*  --  4.4 4.3 4.7  CL 112* 116*  --  118* 118* 109  CO2 16* 20*  --  19* 20* 21*  GLUCOSE 219* 125*  --  103* 147* 169*  BUN 47* 35*  --  37* 40* 44*  CREATININE 0.99 0.77  --  0.81 0.72 0.61  CALCIUM 7.5* 7.6*  --  8.2* 7.9* 7.6*  MG 1.8 1.8 2.2  --  1.6* 1.8  AST 78* 59*  --  45* 36 32  ALT 42 38  --  36 31 29  ALKPHOS 83 70  --  96 79 67  BILITOT 0.7 0.3  --  0.3 0.4 0.8   ------------------------------------------------------------------------------------------------------------------ No results for input(s): CHOL, HDL, LDLCALC, TRIG, CHOLHDL, LDLDIRECT in the last 72 hours.  Lab Results  Component Value Date   HGBA1C 6.8 (H) 05/08/2019   ------------------------------------------------------------------------------------------------------------------ No results for input(s): TSH, T4TOTAL, T3FREE, THYROIDAB in the last 72 hours.  Invalid input(s):  FREET3 ------------------------------------------------------------------------------------------------------------------ Recent Labs    05/10/19 0415 05/11/19 0410  FERRITIN 398* 463*    Coagulation profile No results for input(s): INR, PROTIME in the last 168 hours.  Recent Labs    05/10/19 0415 05/11/19 0410  DDIMER 2.11* 1.88*    Cardiac Enzymes No results for input(s): CKMB, TROPONINI, MYOGLOBIN in the last 168 hours.  Invalid input(s): CK ------------------------------------------------------------------------------------------------------------------ No results found for: BNP  Inpatient Medications  Scheduled Meds:  dexamethasone (DECADRON) injection  6 mg Intravenous Q24H   enoxaparin (LOVENOX) injection  40 mg Subcutaneous Q24H   erythromycin   Both Eyes Q6H   feeding supplement (PRO-STAT SUGAR FREE 64)  30 mL Per Tube BID   free water  200 mL Per Tube Q8H   insulin aspart  0-9 Units Subcutaneous Q4H   Ipratropium-Albuterol  1 puff Inhalation QID   mouth rinse  15 mL Mouth Rinse BID   phosphorus  250 mg Oral BID   vitamin C  500 mg Oral Daily   zinc sulfate  220 mg Oral Daily   Continuous Infusions:  albumin human     dextrose 5 % and 0.9% NaCl 75 mL/hr at 05/11/19 0700   feeding supplement (OSMOLITE 1.2 CAL) 55 mL/hr at 05/11/19 0700   PRN Meds:.acetaminophen, chlorpheniramine-HYDROcodone, guaiFENesin-dextromethorphan, ketorolac, ondansetron **OR** ondansetron (ZOFRAN) IV  Micro Results Recent Results (from the past 240 hour(s))  Urine culture     Status: Abnormal   Collection Time: 05/02/19  8:26 AM   Specimen: In/Out Cath Urine  Result Value Ref Range Status   Specimen Description   Final    IN/OUT CATH URINE Performed at [facility], [address], [place], [place] [address]    Special Requests   Final    NONE Performed at [facility], [address] [place], [place] [address]    Culture >=100,000  COLONIES/mL PROTEUS MIRABILIS (A)  Final   Report Status 05/04/2019 FINAL  Final   Organism ID, Bacteria PROTEUS MIRABILIS (A)  Final      Susceptibility   Proteus mirabilis - MIC*    AMPICILLIN >=32 RESISTANT Resistant     CEFAZOLIN <=4 SENSITIVE Sensitive     CEFTRIAXONE <=1 SENSITIVE Sensitive     CIPROFLOXACIN >=4 RESISTANT Resistant     GENTAMICIN <=1 SENSITIVE Sensitive     IMIPENEM 1 SENSITIVE Sensitive     NITROFURANTOIN 256 RESISTANT Resistant     TRIMETH/SULFA >=320 RESISTANT Resistant     AMPICILLIN/SULBACTAM <=2 SENSITIVE Sensitive     PIP/TAZO <=4 SENSITIVE Sensitive     * >=100,000 COLONIES/mL PROTEUS MIRABILIS  Blood culture (routine x 2)     Status: None   Collection Time: 05/02/19  8:35 AM   Specimen: BLOOD  Result Value Ref Range Status   Specimen Description BLOOD LEFT HAND  Final   Special Requests   Final    BOTTLES DRAWN AEROBIC AND ANAEROBIC Blood Culture adequate volume   Culture   Final    NO GROWTH 5 DAYS Performed at [facility], [address] [place], [place] [address]    Report Status 05/07/2019 FINAL  Final  Blood culture (routine x 2)     Status: None   Collection Time: 05/02/19  8:35 AM   Specimen: BLOOD  Result Value Ref Range Status   Specimen Description BLOOD RIGHT WRIST  Final   Special Requests   Final    BOTTLES DRAWN AEROBIC AND ANAEROBIC Blood Culture results may not be optimal due to an excessive volume of blood received in culture bottles   Culture   Final    NO GROWTH 5 DAYS Performed at [facility], [address] [place], [place] [address]    Report Status 05/07/2019 FINAL  Final  MRSA PCR Screening     Status: None   Collection Time: 05/05/19 11:45 AM   Specimen: Nasal Mucosa; Nasopharyngeal  Result Value Ref Range Status   MRSA by PCR NEGATIVE NEGATIVE Final    Comment:        The GeneXpert MRSA Assay (FDA approved for NASAL specimens only), is one component of a comprehensive MRSA  colonization surveillance program. It is not intended to diagnose MRSA infection nor to guide or monitor treatment for MRSA infections. Performed at [facility], [address], [place], [place] [address]   SARS CORONAVIRUS 2 (TAT 6-24 HRS) Nasopharyngeal Nasopharyngeal Swab     Status: Abnormal   Collection Time: 05/05/19  8:18 PM   Specimen: Nasopharyngeal Swab  Result Value Ref Range Status   SARS Coronavirus 2 POSITIVE (A) NEGATIVE Final    Comment: RESULT CALLED TO, READ BACK BY AND VERIFIED WITH: S.JOHNSEN RN 1755 05/06/2019 MCCORMICK K (NOTE) SARS-CoV-2 target nucleic acids are DETECTED. The SARS-CoV-2 RNA is generally detectable in upper and lower respiratory specimens during the acute phase of infection. Positive results are indicative of active infection with SARS-CoV-2. Clinical  correlation with patient history and other diagnostic information is necessary to determine patient infection status. Positive results do  not rule out bacterial infection or co-infection with other viruses. The expected result is Negative. Fact Sheet for Patients: HairSlick.no Fact Sheet for Healthcare Providers: quierodirigir.com This test is not yet approved or cleared by the [place] FDA and  has been authorized for detection and/or diagnosis of SARS-CoV-2 by FDA under an Emergency Use Authorization (EUA). This EUA will remain  in effect (meaning this test can be used)  for the duration of the COVID-19 declaration under Section 564(b)(1) of the Act, 21 U.S.C. section 360bbb-3(b)(1), unless the authorization is terminated or revoked sooner. Performed at [facility], [address], [place], [place] [address]     Radiology Reports Ct Head Wo Contrast  Result Date: 05/09/2019 CLINICAL DATA:  Altered mental status EXAM: CT HEAD WITHOUT CONTRAST TECHNIQUE: Contiguous axial images were obtained from the  base of the skull through the vertex without intravenous contrast. 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 No acute intracranial abnormality. Specifically, no hemorrhage, hydrocephalus, mass lesion, acute infarction, or significant intracranial injury. There is atrophy and chronic small vessel disease changes. Vascular: No hyperdense vessel or unexpected calcification. Skull: Postsurgical changes in the frontal bones. No acute calvarial abnormality. 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 Atrophy, chronic microvascular disease. No acute intracranial abnormality. Electronically Signed   By: Charlett Nose M.D.   On: 05/09/2019 20:24   Dg Chest Port 1 View  Result Date: 05/07/2019 CLINICAL DATA:  Seven 31-year-old female with NG tube placement. EXAM: PORTABLE CHEST 1 VIEW COMPARISON:  Chest radiograph dated 05/06/2019 FINDINGS: An enteric tube is noted with side port chest distal to the GE junction and tip in the left hemiabdomen likely in the proximal stomach. The tube can be further advanced by approximately 3-5 cm for optimal positioning. Bibasilar interstitial coarsening and nodularity, likely atelectasis or scarring. Pneumonia is not excluded. Clinical correlation is recommended. No focal consolidation, pleural effusion, or pneumothorax. Top-normal cardiac silhouette. Atherosclerotic calcification of the aortic arch. No acute osseous pathology. IMPRESSION: 1. Enteric tube with tip in the proximal stomach. 2. Bibasilar interstitial coarsening and nodularity, likely atelectasis or scarring. Pneumonia is not excluded. Electronically Signed   By: Elgie Collard M.D.   On: 05/07/2019 02:18   Dg Chest Port 1 View  Result Date: 05/06/2019 CLINICAL DATA:  NG tube placement EXAM: PORTABLE CHEST 1 VIEW COMPARISON:  Radiograph 05/05/2019 FINDINGS: The NG tube appears coiled within the  proximal thoracic esophagus with the catheter kinked at the level of side port. Bibasilar opacities concerning for atelectasis or pneumonia are similar to comparison exam. No pneumothorax or visible effusion. The aorta is calcified. The remaining cardiomediastinal contours are unremarkable. No acute osseous or soft  tissue abnormality. IMPRESSION: 1. The NG tube appears coiled within the proximal thoracic esophagus with the catheter kinked at the level of side port. 2. Bibasilar opacities concerning for atelectasis or pneumonia, similar to comparison exam. These results will be called to the ordering clinician or representative by the Radiologist Assistant, and communication documented in the PACS or zVision Dashboard. Electronically Signed   By: Kreg Shropshire M.D.   On: 05/06/2019 19:36   Dg Chest Port 1 View  Result Date: 05/05/2019 CLINICAL DATA:  Pneumonia. EXAM: PORTABLE CHEST 1 VIEW COMPARISON:  May 02, 2019. FINDINGS: The heart size and mediastinal contours are within normal limits. No pneumothorax or pleural effusion is noted. Stable bibasilar opacities are noted concerning for pneumonia or atelectasis. The visualized skeletal structures are unremarkable. IMPRESSION: Stable bibasilar opacities as described above. Aortic Atherosclerosis (ICD10-I70.0). Electronically Signed   By: Lupita Raider M.D.   On: 05/05/2019 08:38   Dg Chest Portable 1 View  Result Date: 05/02/2019 CLINICAL DATA:  Fever.  Positive COVID test EXAM: PORTABLE CHEST 1 VIEW COMPARISON:  04/14/2018 FINDINGS: New streaky infiltrate at the lung bases. Normal heart size and mediastinal contours. No effusion or pneumothorax. IMPRESSION: Bronchopneumonia at the bases. Electronically Signed   By: Marnee Spring M.D.   On: 05/02/2019 08:43      Huey Bienenstock M.D on 05/11/2019 at 11:55 AM  Between 7am to 7pm - Pager - [phone]  After 7pm go to [email or web address] - password [facility]  [facility] -  Office   [phone]

## 2019-05-12 LAB — CBC WITH DIFFERENTIAL/PLATELET
Abs Immature Granulocytes: 0.37 10*3/uL — ABNORMAL HIGH (ref 0.00–0.07)
Basophils Absolute: 0 10*3/uL (ref 0.0–0.1)
Basophils Relative: 0 %
Eosinophils Absolute: 0 10*3/uL (ref 0.0–0.5)
Eosinophils Relative: 0 %
HCT: 26.1 % — ABNORMAL LOW (ref 36.0–46.0)
Hemoglobin: 8.4 g/dL — ABNORMAL LOW (ref 12.0–15.0)
Immature Granulocytes: 2 %
Lymphocytes Relative: 9 %
Lymphs Abs: 1.4 10*3/uL (ref 0.7–4.0)
MCH: 33.3 pg (ref 26.0–34.0)
MCHC: 32.2 g/dL (ref 30.0–36.0)
MCV: 103.6 fL — ABNORMAL HIGH (ref 80.0–100.0)
Monocytes Absolute: 0.4 10*3/uL (ref 0.1–1.0)
Monocytes Relative: 3 %
Neutro Abs: 13.4 10*3/uL — ABNORMAL HIGH (ref 1.7–7.7)
Neutrophils Relative %: 86 %
Platelets: 286 10*3/uL (ref 150–400)
RBC: 2.52 MIL/uL — ABNORMAL LOW (ref 3.87–5.11)
RDW: 14.3 % (ref 11.5–15.5)
WBC: 15.7 10*3/uL — ABNORMAL HIGH (ref 4.0–10.5)
nRBC: 0.1 % (ref 0.0–0.2)

## 2019-05-12 LAB — COMPREHENSIVE METABOLIC PANEL
ALT: 29 U/L (ref 0–44)
AST: 31 U/L (ref 15–41)
Albumin: 2.4 g/dL — ABNORMAL LOW (ref 3.5–5.0)
Alkaline Phosphatase: 62 U/L (ref 38–126)
Anion gap: 9 (ref 5–15)
BUN: 42 mg/dL — ABNORMAL HIGH (ref 8–23)
CO2: 21 mmol/L — ABNORMAL LOW (ref 22–32)
Calcium: 7.8 mg/dL — ABNORMAL LOW (ref 8.9–10.3)
Chloride: 107 mmol/L (ref 98–111)
Creatinine, Ser: 0.53 mg/dL (ref 0.44–1.00)
GFR calc Af Amer: >60 mL/min (ref >60)
GFR calc non Af Amer: >60 mL/min (ref >60)
Glucose, Bld: 98 mg/dL (ref 70–99)
Potassium: 5 mmol/L (ref 3.5–5.1)
Sodium: 137 mmol/L (ref 135–145)
Total Bilirubin: 0.7 mg/dL (ref 0.3–1.2)
Total Protein: 5.4 g/dL — ABNORMAL LOW (ref 6.5–8.1)

## 2019-05-12 LAB — GLUCOSE, CAPILLARY
Glucose-Capillary: 119 mg/dL — ABNORMAL HIGH (ref 70–99)
Glucose-Capillary: 126 mg/dL — ABNORMAL HIGH (ref 70–99)
Glucose-Capillary: 131 mg/dL — ABNORMAL HIGH (ref 70–99)
Glucose-Capillary: 136 mg/dL — ABNORMAL HIGH (ref 70–99)
Glucose-Capillary: 140 mg/dL — ABNORMAL HIGH (ref 70–99)
Glucose-Capillary: 189 mg/dL — ABNORMAL HIGH (ref 70–99)
Glucose-Capillary: 91 mg/dL (ref 70–99)

## 2019-05-12 LAB — PHOSPHORUS: Phosphorus: 2.5 mg/dL (ref 2.5–4.6)

## 2019-05-12 LAB — C-REACTIVE PROTEIN: CRP: <0.8 mg/dL (ref <1.0)

## 2019-05-12 LAB — D-DIMER, QUANTITATIVE: D-Dimer, Quant: 1.82 ug/mL-FEU — ABNORMAL HIGH (ref 0.00–0.50)

## 2019-05-12 LAB — MAGNESIUM: Magnesium: 1.6 mg/dL — ABNORMAL LOW (ref 1.7–2.4)

## 2019-05-12 LAB — LACTATE DEHYDROGENASE: LDH: 296 U/L — ABNORMAL HIGH (ref 98–192)

## 2019-05-12 LAB — FERRITIN: Ferritin: 349 ng/mL — ABNORMAL HIGH (ref 11–307)

## 2019-05-12 NOTE — Progress Notes (Addendum)
PROGRESS NOTE    Tiffany Turner  RSW:[id number] DOB: Feb 27, 1941 DOA: 05/03/2019 PCP: Patient, No Pcp Per    Brief Narrative:  78 year old female who presented with confusion.  She does have significant past medical history of advanced dementia, hypertension and coronary artery disease.  She was noted to have worsening confusion for several days, she was unable to provide direct history due to cognitive impairment.  On her initial physical examination her blood pressure was 136/83, pulse rate 80, respiratory 12, temperature 96.5, oxygen saturation 92%.  Moist mucous membranes, lungs clear to auscultation bilaterally, heart S1-S2 present rhythmic, abdomen soft, no lower extremity edema. 16th, potassium 4.9, chloride 130, bicarb 18, glucose 187, BUN 171, creatinine 5.35, white count 30.3, hemoglobin 13.0, hematocrit 42.3, platelets 479.  SARS COVID-19 was positive.  Urinalysis more than 50 white cells, more than 50 red cells, specific gravity 1.020, protein more than 300.  Head CT with no acute changes, chronic encephalomalacia of the right anterior superior frontal lobe, left anterior inferior frontal lobe and left anterior temporal lobe.  Chest radiograph with a right lower lobe infiltrate.  Patient was admitted to the hospital with a working diagnosis of sepsis (organ failure encephalopathy, acute kidney injury and acute hypoxic respiratory failure), due to urine tract infection, in the setting of SARS COVID-19 viral pneumonia.  Patient has received treatment with antibiotic therapy, antivirals and systemic corticosteroids.  Her urine culture was positive for Proteus mirabilis.  Her kidney function has improved, along with his hypernatremia.    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 SARS COVID 19 viral pneumonia. Patient has completed treatment with Remdesivir and systemic steroids with dexamethasone. Her oxygenation is 100 % on room air. Patient is very weak and deconditioned, unable to swallow due to risk of aspiration. She has a NG tube in place for feedings.   2. Metabolic encephalopathy in the setting of advanced dementia. Patient is very deconditioned, opens her eyes to touch, not verbal and not following commands. She does have baseline advanced dementia. Her Ct head has significant encephalomalacia, overall very poor prognosis.   3. Urinary tract infection with sepsis, organ failure encephalopathy. Patient was treated with IV cephalosporin with good toleration, her blood pressure 99 to 105 systolic.   4. AKI and hypernatremia. Renal function has improved with supportive therapy, likely pre-renal AKI. Her renal function is stable, but patient on tube feedings. Will continue to follow up renal functio while hospitalized.   5. Severe calorie protein malnutrition with hypophosphatemia. Continue with nutritional supplementation, and P repletion.   6. T2DM. Continue insulin sliding scale for glucose cover and monitoring.   7. Conjunctivitis. Continue topical antibiotic therapy.   DVT prophylaxis: enoxaparin   Code Status: DNR  Family Communication: no family at the bedside  Disposition Plan/ discharge barriers: pending placement.   Body mass index is 23.71 kg/m. Malnutrition Type:  Nutrition Problem: Increased  nutrient needs Etiology: acute illness    Malnutrition Characteristics:  Signs/Symptoms: estimated needs   Nutrition Interventions:  Interventions: Tube feeding, Prostat  RN Pressure Injury Documentation: Pressure Injury 05/03/19 Ankle Left Deep Tissue Injury - Purple or maroon localized area of discolored intact skin or blood-filled blister due to damage of underlying soft tissue from pressure and/or shear. (Active)  05/03/19 0045  Location: Ankle  Location Orientation: Left  Staging: Deep Tissue Injury - Purple or maroon localized area of discolored intact skin or blood-filled blister due to damage of underlying soft tissue from pressure and/or shear.  Wound Description (Comments):   Present on Admission: Yes     Pressure Injury 05/03/19 Heel Right Deep Tissue Injury - Purple or maroon localized area of discolored intact skin or blood-filled blister due to damage of underlying soft tissue from pressure and/or shear. (Active)  05/03/19 0045  Location: Heel  Location Orientation: Right  Staging: Deep Tissue Injury - Purple or maroon localized area of discolored intact skin or blood-filled blister due to damage of underlying soft tissue from pressure and/or shear.  Wound Description (Comments):   Present on Admission: Yes     Pressure Injury 05/03/19 Sacrum Deep Tissue Injury - Purple or maroon localized area of discolored intact skin or blood-filled blister due to damage of underlying soft tissue from pressure and/or shear. (Active)  05/03/19 0045  Location: Sacrum  Location Orientation:   Staging: Deep Tissue Injury - Purple or maroon localized area of discolored intact skin or blood-filled blister due to damage of underlying soft tissue from pressure and/or shear.  Wound Description (Comments):   Present on Admission:      Pressure Injury 05/03/19 Buttocks Right;Circumferential Stage I -  Intact skin with non-blanchable redness of a localized area usually over a bony prominence. From brief elastic (Active)  05/03/19  0045  Location: Buttocks  Location Orientation: Right;Circumferential  Staging: Stage I -  Intact skin with non-blanchable redness of a localized area usually over a bony prominence.  Wound Description (Comments): From brief elastic  Present on Admission: Yes     Consultants:     Procedures:     Antimicrobials:       Subjective: Patient is very weak and deconditioned, not interactive, no apparent naisea or vomiting. Has NG tube in place and looks not comfortable.   Objective: Vitals:   05/12/19 0000 05/12/19 0400 05/12/19 0500 05/12/19 0700  BP:  (!) 142/72  (!) 119/54  Pulse:  65    Resp:  19    Temp: 98.2 F (36.8 C) 98.3 F (36.8 C)  97.8 F (36.6 C)  TempSrc: Axillary Axillary  Oral  SpO2:  100%    Weight:   62.7 kg   Height:        Intake/Output Summary (Last 24 hours) at 05/12/2019 0912 Last data filed at 05/12/2019 0529 Gross per 24 hour  Intake 1739.12 ml  Output 2500 ml  Net -760.88 ml   Filed Weights   05/10/19 0400 05/11/19 0800 05/12/19 0500  Weight: 62.3 kg 61.3 kg 62.7 kg    Examination:   General: Not in pain or dyspnea, deconditioned.  Neurology: Awake and alert, non focal  E ENT: no pallor, no icterus, oral mucosa moist/ NG tube in place.  Cardiovascular: No JVD. S1-S2 present, rhythmic, no gallops, rubs, or murmurs. Mild  lower extremity edema. Pulmonary: vesicular breath sounds bilaterally. Gastrointestinal. Abdomen with no organomegaly, non tender, no rebound or guarding Skin. Bilateral lower extremities wiunds Musculoskeletal: no  joint deformities     Data Reviewed: I have personally reviewed following labs and imaging studies  CBC: Recent Labs  Lab 05/08/19 0154 05/09/19 0536 05/10/19 0415 05/11/19 0410 05/12/19 0455  WBC 16.8* 24.2* 18.3* 17.8* 15.7*  NEUTROABS 15.1* 21.5* 16.1* 15.5* 13.4*  HGB 9.2* 10.0* 8.8* 8.6* 8.4*  HCT 28.7* 32.2* 28.5* 26.8* 26.1*  MCV 102.5* 108.4* 106.7* 104.7* 103.6*  PLT 289 351 324 282  950   Basic Metabolic Panel: Recent Labs  Lab 05/08/19 0154 05/09/19 0530 05/09/19 0536 05/10/19 0415 05/11/19 0410 05/12/19 0455  NA 146*  --  146* 144 137 137  K 2.9*  --  4.4 4.3 4.7 5.0  CL 116*  --  118* 118* 109 107  CO2 20*  --  19* 20* 21* 21*  GLUCOSE 125*  --  103* 147* 169* 98  BUN 35*  --  37* 40* 44* 42*  CREATININE 0.77  --  0.81 0.72 0.61 0.53  CALCIUM 7.6*  --  8.2* 7.9* 7.6* 7.8*  MG 1.8 2.2  --  1.6* 1.8 1.6*  PHOS 2.6 3.2  --  2.4* 1.8* 2.5   GFR: Estimated Creatinine Clearance: 49.2 mL/min (by C-G formula based on SCr of 0.53 mg/dL). Liver Function Tests: Recent Labs  Lab 05/08/19 0154 05/09/19 0536 05/10/19 0415 05/11/19 0410 05/12/19 0455  AST 59* 45* 36 32 31  ALT 38 36 31 29 29   ALKPHOS 70 96 79 67 62  BILITOT 0.3 0.3 0.4 0.8 0.7  PROT 6.1* 6.3* 5.6* 5.5* 5.4*  ALBUMIN 2.5* 2.5* 2.1* 2.5* 2.4*   No results for input(s): LIPASE, AMYLASE in the last 168 hours. No results for input(s): AMMONIA in the last 168 hours. Coagulation Profile: No results for input(s): INR, PROTIME in the last 168 hours. Cardiac Enzymes: No results for input(s): CKTOTAL, CKMB, CKMBINDEX, TROPONINI in the last 168 hours. BNP (last 3 results) No results for input(s): PROBNP in the last 8760 hours. HbA1C: No results for input(s): HGBA1C in the last 72 hours. CBG: Recent Labs  Lab 05/11/19 1640 05/11/19 2014 05/11/19 2356 05/12/19 0451 05/12/19 0723  GLUCAP 204* 161* 119* 91 140*   Lipid Profile: No results for input(s): CHOL, HDL, LDLCALC, TRIG, CHOLHDL, LDLDIRECT in the last 72 hours. Thyroid Function Tests: No results for input(s): TSH, T4TOTAL, FREET4, T3FREE, THYROIDAB in the last 72 hours. Anemia Panel: Recent Labs    05/11/19 0410 05/12/19 0455  FERRITIN 463* 349*      Radiology Studies: I have reviewed all of the imaging during this hospital visit personally     Scheduled Meds: . enoxaparin (LOVENOX) injection  40 mg Subcutaneous Q24H   . erythromycin   Both Eyes Q6H  . feeding supplement (PRO-STAT SUGAR FREE 64)  30 mL Per Tube BID  . free water  200 mL Per Tube Q8H  . insulin aspart  0-9 Units Subcutaneous Q4H  . Ipratropium-Albuterol  1 puff Inhalation QID  . mouth rinse  15 mL Mouth Rinse BID  . phosphorus  250 mg Oral BID  . vitamin C  500 mg Oral Daily  . zinc sulfate  220 mg Oral Daily   Continuous Infusions: . albumin human    . dextrose 5 % and 0.9% NaCl 75 mL/hr at 05/12/19 0821  . feeding supplement (OSMOLITE 1.2 CAL) 1,000 mL (05/12/19 0529)     LOS: 9 days        Annora Guderian Gerome Apley, MD

## 2019-05-12 NOTE — Progress Notes (Signed)
Lab Results WBC  Date/Time Value Ref Range Status  05/12/2019 04:55 AM 15.7 (H) 4.0 - 10.5 K/uL Final  05/11/2019 04:10 AM 17.8 (H) 4.0 - 10.5 K/uL Final  05/10/2019 04:15 AM 18.3 (H) 4.0 - 10.5 K/uL Final   Neutrophils Relative %  Date/Time Value Ref Range Status  05/12/2019 04:55 AM 86 % Final  05/11/2019 04:10 AM 88 % Final  05/10/2019 04:15 AM 89 % Final   No results found for: PCO2ART Lactic Acid, Venous  Date/Time Value Ref Range Status  05/03/2019 04:40 AM 1.4 0.5 - 1.9 mmol/L Final    Comment:    Performed at [facility], [place] [address], [place], [place] [address]  05/02/2019 11:54 AM 1.4 0.5 - 1.9 mmol/L Final    Comment:    Performed at [facility], [address] [place], [place] [address]  05/02/2019 08:26 AM 2.7 (HH) 0.5 - 1.9 mmol/L Final    Comment:    CRITICAL RESULT CALLED TO, READ BACK BY AND VERIFIED WITH MARY NEEDHAM @0910  ON 05/02/2019 BY FMW Performed at [facility], [address] [place], [place] [address]    pCO2, Ven  Date/Time Value Ref Range Status  05/02/2019 08:35 AM 33 (L) 44.0 - 60.0 mmHg Final  MEWS remains in yellow due to LOC.  Charge nurse and MD aware.

## 2019-05-12 NOTE — Progress Notes (Signed)
Spoke with Gerald Stabs and updated him.

## 2019-05-12 NOTE — Care Management (Signed)
Spoke w Otila Kluver from [employer], [phone]. She states that patient does not need a negative COVID to return, and they will have a bed available when the patient is ready to go back.

## 2019-05-13 ENCOUNTER — Inpatient Hospital Stay (HOSPITAL_COMMUNITY): Payer: Medicare Other

## 2019-05-13 LAB — GLUCOSE, CAPILLARY
Glucose-Capillary: 124 mg/dL — ABNORMAL HIGH (ref 70–99)
Glucose-Capillary: 99 mg/dL (ref 70–99)
Glucose-Capillary: 119 mg/dL — ABNORMAL HIGH (ref 70–99)
Glucose-Capillary: 107 mg/dL — ABNORMAL HIGH (ref 70–99)
Glucose-Capillary: 86 mg/dL (ref 70–99)

## 2019-05-13 LAB — COMPREHENSIVE METABOLIC PANEL
ALT: 36 U/L (ref 0–44)
AST: 35 U/L (ref 15–41)
Albumin: 2.5 g/dL — ABNORMAL LOW (ref 3.5–5.0)
Alkaline Phosphatase: 76 U/L (ref 38–126)
Anion gap: 10 (ref 5–15)
BUN: 47 mg/dL — ABNORMAL HIGH (ref 8–23)
CO2: 22 mmol/L (ref 22–32)
Calcium: 8.1 mg/dL — ABNORMAL LOW (ref 8.9–10.3)
Chloride: 104 mmol/L (ref 98–111)
Creatinine, Ser: 0.56 mg/dL (ref 0.44–1.00)
GFR calc Af Amer: >60 mL/min (ref >60)
GFR calc non Af Amer: >60 mL/min (ref >60)
Glucose, Bld: 59 mg/dL — ABNORMAL LOW (ref 70–99)
Potassium: 5.3 mmol/L — ABNORMAL HIGH (ref 3.5–5.1)
Sodium: 136 mmol/L (ref 135–145)
Total Bilirubin: 0.5 mg/dL (ref 0.3–1.2)
Total Protein: 5.9 g/dL — ABNORMAL LOW (ref 6.5–8.1)

## 2019-05-13 NOTE — Progress Notes (Signed)
Nutrition Follow-up  INTERVENTION:   Continue: -Osmolite 1.2 @ 55 ml/hr via NGT  -30 ml Prostat BID  -Continue free water flushes of 100 ml every 8 hours -Tube feeding regimen will provide 1784 kcal, 103g protein and 1670 ml H2O.   NUTRITION DIAGNOSIS:   Increased nutrient needs related to acute illness as evidenced by estimated needs. Ongoing.   GOAL:   Patient will meet greater than or equal to 90% of their needs Met.   MONITOR:   Labs, TF tolerance, Weight trends, Skin, I & O's  REASON FOR ASSESSMENT:   Consult Assessment of nutrition requirement/status(enteral feeds)  ASSESSMENT:   78 y.o. female with medical history significant of advanced dementia, htn, CAD presents with confusion over the last several days.  Pt with h/o freq utis.  Cannot provide any history due to her dementia.  All history obtained from records.  Found to be septic from uti with aki cr over 5 and covid positive.  Pt on room air. Pt from [place] and can go back without a negative test. Noted pt still NPO with NG tube for feedings which will need to be addressed prior to discharge.   10/27: admitted, COVID-19 positive 10/31: NGT placed  Medications: Free water 200 ml every 8 hours = 600 ml   NUTRITION - FOCUSED PHYSICAL EXAM:  Unable to perform -working remotely.  Diet Order:   Diet Order    None      EDUCATION NEEDS:   Not appropriate for education at this time  Skin:  Skin Assessment: Skin Integrity Issues: Skin Integrity Issues:: DTI, Stage I DTI: left ankle, right heel, sacrum Stage I: right buttocks  Last BM:  11/4   Height:   Ht Readings from Last 1 Encounters:  05/03/19 5' 4" (1.626 m)    Weight:   Wt Readings from Last 1 Encounters:  05/13/19 61.4 kg    Ideal Body Weight:  54.5 kg  BMI:  Body mass index is 23.23 kg/m.  Estimated Nutritional Needs:   Kcal:  9735-3299  Protein:  90-100g  Fluid:  1.8L/day  [place], [place], [place]  Pager [phone] After Hours Pager

## 2019-05-13 NOTE — Progress Notes (Signed)
Found pt to be breathing harder than previously documented. NG tube noted to be out much farther than normal , found no air bolus in gastric region upon checking placement.. Moist breath sounds advanced NG tube in  approx 8 inches and got a good air bolus & aspirate. Later found documentation of exterior tube length to be marked at 64cm but tube remains out further than that so tube advanced to markings on tube to match initial insertion length.  Amion text page to Dr Shanon Brow who was updated on scenerio. Orderd STAT KUB. Feeding pum was shut off until placement verified.

## 2019-05-13 NOTE — Progress Notes (Signed)
Assisted tele visit to patient with [place] [facility] representative Otilio Connors.  Ladarien Beeks, Aliene Beams, RN

## 2019-05-13 NOTE — Progress Notes (Addendum)
PROGRESS NOTE    Enedina FinnerJosephine M Knock  ZOX:[id number] DOB: 04/17/1941 DOA: 05/03/2019 PCP: Patient, No Pcp Per    Brief Narrative: 78 year old female who presented with confusion.  She does have significant past medical history of advanced dementia, hypertension and coronary artery disease.  She was noted to have worsening confusion for several days, she was unable to provide direct history due to cognitive impairment.  On her initial physical examination her blood pressure was 136/83, pulse rate 80, respiratory 12, temperature 96.5, oxygen saturation 92%.  Moist mucous membranes, lungs clear to auscultation bilaterally, heart S1-S2 present rhythmic, abdomen soft, no lower extremity edema. 16th, potassium 4.9, chloride 130, bicarb 18, glucose 187, BUN 171, creatinine 5.35, white count 30.3, hemoglobin 13.0, hematocrit 42.3, platelets 479.  SARS COVID-19 was positive.  Urinalysis more than 50 white cells, more than 50 red cells, specific gravity 1.020, protein more than 300.  Head CT with no acute changes, chronic encephalomalacia of the right anterior superior frontal lobe, left anterior inferior frontal lobe and left anterior temporal lobe.  Chest radiograph with a right lower lobe infiltrate.  Patient was admitted to the hospital with a working diagnosis of sepsis (organ failure encephalopathy, acute kidney injury and acute hypoxic respiratory failure), due to urine tract infection, in the setting of SARS COVID-19 viral pneumonia.  Patient has received treatment with antibiotic therapy, antivirals and systemic corticosteroids.  Her urine culture was positive for Proteus mirabilis.  Her kidney function has improved, along with his hypernatremia.    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 SARS COVID 19 viral pneumonia. Completed treatment with Remdesivir and dexamethasone. Her oxygenation continue to be at 100 % on room air. But she continue to be very weak and deconditioned, not following commands and not able to eat by mouth.   2. Metabolic encephalopathy in the setting of advanced dementia. Positive baseline advanced dementia.Ct head has significant encephalomalacia.  Has remained very deconditioned, nutrition per tube feedings with very low possibility of recovery.   3. Urinary tract infection with sepsis, organ failure encephalopathy. Completed therapy with IV cephalosporin.    4. AKI and hypernatremia. Stable renal function with serum cr at 0.56 with K at 5,3 and serum bicarbonate at 22. Continue water flushes and tube feedings for now. Patient with very poor prognosis. .   5. Severe calorie protein malnutrition with hypophosphatemia. On nutritional supplementation. Check P in am.   6. T2DM/ hypoglycemia. Fasting glucose this am is 59, capillary is 136, 124, and 99. Continue tube feedings and nsulin sliding scale for glucose cover and monitoring.   7. Conjunctivitis. On topical antibiotic therapy.   8. Multiple pressure ulcers: (present on admission) Left ankle, right heal and sacrum (deep tissue) unable to stage. Right buttock stage 1. Continue with local wound care. Patient not ambulatory with very poor prognosis.    DVT prophylaxis: enoxaparin  Code Status: DNR  Family Communication: after multiple calls I have been not able to reach patient's  son. I will keep calling his contact number. No voice mail available.   I spoke over the phone with the patient's sister about patient's  condition, plan of care and all questions were addressed. I asked her to communicate with patient's son that I was hoping to talk to him about his mother condition.   Disposition Plan/ discharge barriers: pending placement.    Body mass index is 23.23 kg/m. Malnutrition Type:  Nutrition Problem: Increased nutrient needs Etiology: acute illness   Malnutrition Characteristics:  Signs/Symptoms: estimated needs   Nutrition Interventions:  Interventions: Tube feeding, Prostat  RN Pressure Injury Documentation: Pressure Injury 05/03/19 Ankle Left Deep Tissue Injury - Purple or maroon localized area of discolored intact skin or blood-filled blister due to damage of underlying soft tissue from pressure and/or shear. (Active)  05/03/19 0045  Location: Ankle  Location Orientation: Left  Staging: Deep Tissue Injury - Purple or maroon localized area of discolored intact skin or blood-filled blister due to damage of underlying soft tissue from pressure and/or shear.  Wound Description (Comments):   Present on Admission: Yes     Pressure Injury 05/03/19 Heel Right Deep Tissue Injury - Purple or maroon localized area of discolored intact skin or blood-filled blister due to damage of underlying soft tissue from pressure and/or shear. (Active)  05/03/19 0045  Location: Heel  Location Orientation: Right  Staging: Deep Tissue Injury - Purple or maroon localized area of discolored intact skin or blood-filled blister due to damage of underlying soft tissue from pressure and/or shear.  Wound Description (Comments):   Present on Admission: Yes     Pressure Injury 05/03/19 Sacrum Deep Tissue Injury - Purple or maroon localized area of discolored intact skin or blood-filled blister due to damage of underlying soft tissue from pressure and/or shear. (Active)   05/03/19 0045  Location: Sacrum  Location Orientation:   Staging: Deep Tissue Injury - Purple or maroon localized area of discolored intact skin or blood-filled blister due to damage of underlying soft tissue from pressure and/or shear.  Wound Description (Comments):   Present on Admission:      Pressure Injury 05/03/19 Buttocks Right;Circumferential Stage I -  Intact skin with non-blanchable redness of a localized area usually over a bony prominence. From brief elastic (Active)  05/03/19 0045  Location: Buttocks  Location Orientation: Right;Circumferential  Staging: Stage I -  Intact skin with non-blanchable redness of a localized area usually over a bony prominence.  Wound Description (Comments): From brief elastic  Present on Admission: Yes     Consultants:     Procedures:     Antimicrobials:       Subjective: Patient is very weak and deconditioned, opens eyes to voice, but not follows commands or answers to simple questions. Has NG tube in place and looks uncomfortable.   Objective: Vitals:   05/12/19 1944 05/12/19 2303 05/13/19 0353 05/13/19 0510  BP: (!) 118/53 (!) 120/56 (!) 118/52   Pulse: 81 94 78   Resp: 18 17 (!) 22   Temp: 98.6 F (37 C) 99 F (37.2 C) 99.2 F (37.3 C)   TempSrc: Axillary Axillary Axillary   SpO2: 100% 100% 100%   Weight:    61.4 kg  Height:        Intake/Output Summary (Last 24 hours) at 05/13/2019 0844 Last data filed at 05/13/2019 0510 Gross per 24 hour  Intake 2207 ml  Output 2150 ml  Net 57 ml   Filed Weights   05/11/19 0800 05/12/19 0500 05/13/19 0510  Weight: 61.3 kg 62.7 kg 61.4 kg    Examination:   General: Not in pain or dyspnea, deconditioned and ill looking appearing, looks not comfortable.  Neurology: Opens eyes to voice but not follows commands. E ENT: no pallor, no icterus, oral mucosa moist. NG tube in place.  Cardiovascular: No JVD. S1-S2 present, rhythmic, no gallops, rubs, or murmurs. No lower extremity  edema. Pulmonary: positive breath sounds bilaterally. Gastrointestinal. Abdomen with no organomegaly, non tender, no rebound or guarding Skin. No rashes Musculoskeletal: no joint deformities     Data Reviewed: I have personally reviewed following labs and imaging studies  CBC: Recent Labs  Lab 05/08/19 0154 05/09/19 0536 05/10/19 0415 05/11/19 0410 05/12/19 0455  WBC 16.8* 24.2* 18.3* 17.8* 15.7*  NEUTROABS 15.1* 21.5* 16.1* 15.5* 13.4*  HGB 9.2* 10.0* 8.8* 8.6* 8.4*  HCT 28.7* 32.2* 28.5* 26.8* 26.1*  MCV 102.5* 108.4* 106.7* 104.7* 103.6*  PLT 289 351 324 282 286   Basic Metabolic Panel: Recent Labs  Lab 05/08/19 0154 05/09/19 0530 05/09/19 0536 05/10/19 0415 05/11/19 0410 05/12/19 0455 05/13/19 0035  NA 146*  --  146* 144 137 137 136  K 2.9*  --  4.4 4.3 4.7 5.0 5.3*  CL 116*  --  118* 118* 109 107 104  CO2 20*  --  19* 20* 21* 21* 22  GLUCOSE 125*  --  103* 147* 169* 98 59*  BUN 35*  --  37* 40* 44* 42* 47*  CREATININE 0.77  --  0.81 0.72 0.61 0.53 0.56  CALCIUM 7.6*  --  8.2* 7.9* 7.6* 7.8* 8.1*  MG 1.8 2.2  --  1.6* 1.8 1.6*  --   PHOS 2.6 3.2  --  2.4* 1.8* 2.5  --    GFR: Estimated Creatinine Clearance: 49.2 mL/min (by C-G formula based on SCr of 0.56 mg/dL). Liver Function Tests: Recent Labs  Lab 05/09/19 0536 05/10/19 0415 05/11/19 0410 05/12/19 0455 05/13/19 0035  AST 45* 36 32 31 35  ALT 36 31 29 29  36  ALKPHOS 96 79 67 62 76  BILITOT 0.3 0.4 0.8 0.7 0.5  PROT 6.3* 5.6* 5.5* 5.4* 5.9*  ALBUMIN 2.5* 2.1* 2.5* 2.4* 2.5*   No results for input(s): LIPASE, AMYLASE in the last 168 hours. No results for input(s): AMMONIA in the last 168 hours. Coagulation Profile: No results for input(s): INR, PROTIME in the last 168 hours. Cardiac Enzymes: No results for input(s): CKTOTAL, CKMB, CKMBINDEX, TROPONINI in the last 168 hours. BNP (last 3 results) No results for input(s): PROBNP in the last 8760 hours. HbA1C: No results for input(s): HGBA1C  in the last 72 hours. CBG: Recent Labs  Lab 05/12/19 1132 05/12/19 1545 05/12/19 1940 05/12/19 2302 05/13/19 0349  GLUCAP 131* 189* 126* 136* 124*   Lipid Profile: No results for input(s): CHOL, HDL, LDLCALC, TRIG, CHOLHDL, LDLDIRECT in the last 72 hours. Thyroid Function Tests: No results for input(s): TSH, T4TOTAL, FREET4, T3FREE, THYROIDAB in the last 72 hours. Anemia Panel: Recent Labs    05/11/19 0410 05/12/19 0455  FERRITIN 463* 349*      Radiology Studies: I have reviewed all of the imaging during this hospital visit personally     Scheduled Meds: . enoxaparin (LOVENOX) injection  40 mg Subcutaneous Q24H  . erythromycin   Both Eyes Q6H  . feeding supplement (PRO-STAT SUGAR FREE 64)  30 mL Per  Tube BID  . free water  200 mL Per Tube Q8H  . insulin aspart  0-9 Units Subcutaneous Q4H  . Ipratropium-Albuterol  1 puff Inhalation QID  . mouth rinse  15 mL Mouth Rinse BID  . vitamin C  500 mg Oral Daily  . zinc sulfate  220 mg Oral Daily   Continuous Infusions: . albumin human    . feeding supplement (OSMOLITE 1.2 CAL) 55 mL/hr at 05/13/19 0400     LOS: 10 days        Mauricio Gerome Apley, MD

## 2019-05-13 NOTE — Plan of Care (Signed)
Called and spoke with son to give update and discuss current POC.  Understands that patient is doing fairly well from covid standpoint but is still VERY weak and malnourished.  Discussed possible plans for discharge to SNF once ready.

## 2019-05-13 NOTE — Plan of Care (Signed)
Pt appears more alert com[pared to last few days, is able to answer simple yes or no questions, purposeful movement of RUE also noted. PRN Toradol given for signs of pain. Vitals stable on RA. Weak, productive cough noted, prn Robitussin given. Dependent with ADLs, incontinent. Purewick replaced. Skin assessed, wound dressings dry and intact. NG tube feed continued with water flushes. Frequent oral care and Q2 repositioning ensured, prevlon boots in place. No other issues, will monitor.  Problem: Respiratory: Goal: Will maintain a patent airway Outcome: Progressing Goal: Complications related to the disease process, condition or treatment will be avoided or minimized Outcome: Progressing   Problem: Respiratory: Goal: Complications related to the disease process, condition or treatment will be avoided or minimized Outcome: Progressing   Problem: Nutrition: Goal: Adequate nutrition will be maintained Outcome: Progressing   Problem: Pain Managment: Goal: General experience of comfort will improve Outcome: Progressing   Problem: Skin Integrity: Goal: Risk for impaired skin integrity will decrease Outcome: Progressing

## 2019-05-14 LAB — COMPREHENSIVE METABOLIC PANEL
ALT: 51 U/L — ABNORMAL HIGH (ref 0–44)
AST: 46 U/L — ABNORMAL HIGH (ref 15–41)
Albumin: 2.3 g/dL — ABNORMAL LOW (ref 3.5–5.0)
Alkaline Phosphatase: 84 U/L (ref 38–126)
Anion gap: 10 (ref 5–15)
BUN: 51 mg/dL — ABNORMAL HIGH (ref 8–23)
CO2: 20 mmol/L — ABNORMAL LOW (ref 22–32)
Calcium: 8.1 mg/dL — ABNORMAL LOW (ref 8.9–10.3)
Chloride: 104 mmol/L (ref 98–111)
Creatinine, Ser: 0.68 mg/dL (ref 0.44–1.00)
GFR calc Af Amer: >60 mL/min (ref >60)
GFR calc non Af Amer: >60 mL/min (ref >60)
Glucose, Bld: 116 mg/dL — ABNORMAL HIGH (ref 70–99)
Potassium: 4.8 mmol/L (ref 3.5–5.1)
Sodium: 134 mmol/L — ABNORMAL LOW (ref 135–145)
Total Bilirubin: 0.4 mg/dL (ref 0.3–1.2)
Total Protein: 5.6 g/dL — ABNORMAL LOW (ref 6.5–8.1)

## 2019-05-14 LAB — GLUCOSE, CAPILLARY
Glucose-Capillary: 123 mg/dL — ABNORMAL HIGH (ref 70–99)
Glucose-Capillary: 116 mg/dL — ABNORMAL HIGH (ref 70–99)
Glucose-Capillary: 92 mg/dL (ref 70–99)
Glucose-Capillary: 132 mg/dL — ABNORMAL HIGH (ref 70–99)
Glucose-Capillary: 133 mg/dL — ABNORMAL HIGH (ref 70–99)
Glucose-Capillary: 92 mg/dL (ref 70–99)

## 2019-05-14 MED ORDER — METOPROLOL TARTRATE 5 MG/5ML IV SOLN: 2.5000 mg | INTRAVENOUS | Status: DC | PRN

## 2019-05-14 NOTE — Progress Notes (Signed)
PROGRESS NOTE    THURSA EMME  RKY:[id number] DOB: Jul 14, 1940 DOA: 05/03/2019 PCP: Patient, No Pcp Per    Brief Narrative:  78 year old female who presented with confusion. She does have significant past medical history of advanced dementia, hypertension and coronary artery disease. She was noted to have worsening confusion for several days, she was unable to provide direct history due to cognitive impairment. On her initial physical examination her blood pressure was 136/83, pulse rate 80, respiratory 12, temperature 96.5, oxygen saturation 92%.Moist mucous membranes, lungs clear to auscultation bilaterally, heart S1-S2 present rhythmic, abdomen soft, no lower extremity edema. 16th, potassium 4.9, chloride 130, bicarb 18, glucose 187, BUN 171, creatinine 5.35, white count 30.3, hemoglobin 13.0, hematocrit 42.3, platelets 479.SARS COVID-19 was positive.Urinalysis more than 50 white cells, more than 50 red cells, specific gravity 1.020, protein more than 300.Head CT with no acute changes, chronic encephalomalacia of the right anterior superior frontal lobe, left anterior inferior frontal lobe and left anterior temporal lobe. Chest radiograph with a right lower lobe infiltrate.  Patient was admitted to the hospital with a working diagnosis of sepsis(organ failure encephalopathy, acute kidney injury andacutehypoxic respiratory failure),due to urine tract infection, in the setting of SARS COVID-19 viral pneumonia.  Patient has received treatment with antibiotic therapy, antivirals and systemic corticosteroids. Her urine culture was positive for Proteus mirabilis. Her kidney function has improved, along with his hypernatremia.   Patient has severe deconditioning and rapid decline in her physical functional status, currently unable to eat due to swallow dysfunction  I spoke with patient's son over the phone, we talked about patient's condition, and persistent swallow  dysfunction.  At this point considering patient's quality of life will remove NG tube and will proceed with careful feedings by mouth.  Palliative feedings with the focus on keeping patient comfortable and avoid suffering.  Continue close monitoring p.o. intake.  His son agrees with this plan, he is aware of risk of aspiration but currently will he agrees in focusing in patient's comfort and avoid suffering.  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 SARS COVID 19 viral pneumonia. Completed treatment with Remdesivir and dexamethasone. Patient has tachypnea but her oxygenation remains stable on room air. As a sequale of severe viral infection patient is very weak and deconditioned.   2. Metabolic encephalopathy in the setting of advanced dementia. Positive baseline advanced dementia.Ct head has significant encephalomalacia.  Poor communication, not following commands.  3. Urinary tract infection with sepsis, organ failure encephalopathy. Completed therapy with IV cephalosporin.  Clinically has resolved.   4. AKI and hypernatremia. Her renal function has remained stable, will remove NG tube for now and will follow renal panel in 48 H. Patient is very weak and deconditioned.   5. Severe calorie protein malnutrition with hypophosphatemia. On nutritional supplementation as tolerated.   6. T2DM/ hypoglycemia. continue to hold on insulin therapy, serum glucose this am 116, while on tube feedings.Will continue close  monitoring.   7. Conjunctivitis. Continue with topical antibiotic therapy.  8. Multiple pressure ulcers: (present on admission) Left ankle, right heal and sacrum (deep tissue) unable to stage. Right buttock stage 1. Local wound care. Patient not ambulatory with very poor prognosis.    DVT prophylaxis:enoxaparin Code Status:DNR Family Communication: I spoke with patient's son over the phone as above.    Body mass index is 23.23 kg/m. Malnutrition Type:  Nutrition Problem: Increased nutrient needs Etiology: acute illness   Malnutrition Characteristics:  Signs/Symptoms: estimated needs   Nutrition Interventions:  Interventions: Tube feeding, Prostat  RN Pressure Injury Documentation: Pressure Injury 05/03/19 Ankle Left Deep Tissue Injury - Purple or maroon localized area of discolored intact skin or blood-filled blister due to damage of underlying soft tissue from pressure and/or shear. (Active)  05/03/19 0045  Location: Ankle  Location Orientation: Left  Staging: Deep Tissue Injury - Purple or maroon localized area of discolored intact skin or blood-filled blister due to damage of underlying soft tissue from pressure and/or shear.  Wound Description (Comments):   Present on Admission: Yes     Pressure Injury 05/03/19 Heel Right Deep Tissue Injury - Purple or maroon localized area of discolored intact skin or blood-filled blister due to damage of underlying soft tissue from pressure and/or shear. (Active)  05/03/19 0045  Location: Heel  Location Orientation: Right  Staging: Deep Tissue Injury - Purple or maroon localized area of discolored intact skin or blood-filled blister due to damage of underlying soft tissue from pressure and/or shear.  Wound Description (Comments):   Present on Admission: Yes     Pressure Injury 05/03/19 Sacrum Deep Tissue Injury - Purple or maroon localized area of discolored intact skin or blood-filled blister due to damage of underlying  soft tissue from pressure and/or shear. (Active)  05/03/19 0045  Location: Sacrum  Location Orientation:   Staging: Deep Tissue Injury - Purple or maroon localized area of discolored intact skin or blood-filled blister due to damage of underlying soft tissue from pressure and/or shear.  Wound Description (Comments):   Present on Admission:      Pressure Injury 05/03/19 Buttocks Right;Circumferential Stage I -  Intact skin with non-blanchable redness of a localized area usually over a bony prominence. From brief elastic (Active)  05/03/19 0045  Location: Buttocks  Location Orientation: Right;Circumferential  Staging: Stage I -  Intact skin with non-blanchable redness of a localized area usually over a bony prominence.  Wound Description (Comments): From brief elastic  Present on Admission: Yes     Consultants:     Procedures:     Antimicrobials:       Subjective: Patient continue to be very weak and deconditioned, she was able to have some po with aspiration precautions. This am had difficulties with NG tube.    Objective: Vitals:   05/13/19 2300 05/14/19 0300 05/14/19 0400 05/14/19 0803  BP:    (!) 102/42  Pulse: 78 88 77 85  Resp: 13 15 (!) 27 18  Temp:    98.4 F (36.9 C)  TempSrc:    Axillary  SpO2: 99% 100% 100% 100%  Weight:      Height:        Intake/Output Summary (Last 24 hours) at 05/14/2019 0902 Last data filed at 05/13/2019 2350 Gross per 24 hour  Intake 750 ml  Output 500 ml  Net 250 ml   Filed Weights   05/11/19 0800 05/12/19 0500 05/13/19 0510  Weight: 61.3 kg 62.7 kg 61.4 kg    Examination:   General: Not in pain or dyspnea,  deconditioned  Neurology: Awake, only answers yes and not to simple questions.  E ENT: mild pallor, no icterus, oral mucosa moist. NG tube in place.  Cardiovascular: No JVD. S1-S2 present, rhythmic, no gallops, rubs, or murmurs. No lower extremity edema. Pulmonary: positive breath sounds bilaterally.  Gastrointestinal. Abdomen with no organomegaly, non tender, no rebound or guarding Skin. No rashes Musculoskeletal: no joint deformities     Data Reviewed: I have personally reviewed following labs and imaging studies  CBC: Recent Labs  Lab 05/08/19 0154 05/09/19 0536 05/10/19 0415 05/11/19 0410 05/12/19 0455  WBC 16.8* 24.2* 18.3* 17.8* 15.7*  NEUTROABS 15.1* 21.5* 16.1* 15.5* 13.4*  HGB 9.2* 10.0* 8.8* 8.6* 8.4*  HCT 28.7* 32.2* 28.5* 26.8* 26.1*  MCV 102.5* 108.4* 106.7* 104.7* 103.6*  PLT 289 351 324 282 166   Basic Metabolic Panel: Recent Labs  Lab 05/08/19 0154 05/09/19 0530  05/10/19 0415 05/11/19 0410 05/12/19 0455 05/13/19 0035 05/14/19 0127  NA 146*  --    < > 144 137 137 136 134*  K 2.9*  --    < > 4.3 4.7 5.0 5.3* 4.8  CL 116*  --    < > 118* 109 107 104 104  CO2 20*  --    < > 20* 21* 21* 22 20*  GLUCOSE 125*  --    < > 147* 169* 98 59* 116*  BUN 35*  --    < > 40* 44* 42* 47* 51*  CREATININE 0.77  --    < > 0.72 0.61 0.53 0.56 0.68  CALCIUM 7.6*  --    < > 7.9* 7.6* 7.8* 8.1* 8.1*  MG 1.8 2.2  --  1.6* 1.8 1.6*  --   --   PHOS 2.6 3.2  --  2.4* 1.8* 2.5  --   --    < > = values in this interval not displayed.   GFR: Estimated Creatinine Clearance: 49.2 mL/min (by C-G formula based on SCr of 0.68 mg/dL). Liver Function Tests: Recent Labs  Lab 05/10/19 0415 05/11/19 0410 05/12/19 0455 05/13/19 0035 05/14/19 0127  AST 36 32 31 35 46*  ALT 31 29 29  36 51*  ALKPHOS 79 67 62 76 84  BILITOT 0.4 0.8 0.7 0.5 0.4  PROT 5.6* 5.5* 5.4* 5.9* 5.6*  ALBUMIN 2.1* 2.5* 2.4* 2.5* 2.3*   No results for input(s): LIPASE, AMYLASE in the last 168 hours. No results for input(s): AMMONIA in the last 168 hours. Coagulation Profile: No results for input(s): INR, PROTIME in the last 168 hours. Cardiac Enzymes: No results for input(s): CKTOTAL, CKMB, CKMBINDEX, TROPONINI in the last 168 hours. BNP (last 3 results) No results for input(s): PROBNP in the last  8760 hours. HbA1C: No results for input(s): HGBA1C in the last 72 hours. CBG: Recent Labs  Lab 05/13/19 1535 05/13/19 2220 05/14/19 0108 05/14/19 0400 05/14/19 0757  GLUCAP 107* 86 123* 116* 92   Lipid Profile: No results for input(s): CHOL, HDL, LDLCALC, TRIG, CHOLHDL, LDLDIRECT in the last 72 hours. Thyroid Function Tests: No results for input(s): TSH, T4TOTAL, FREET4, T3FREE, THYROIDAB in the last 72 hours. Anemia Panel: Recent Labs    05/12/19 0455  FERRITIN 349*      Radiology Studies: I have reviewed all of the imaging during this hospital visit personally     Scheduled Meds: . enoxaparin (LOVENOX) injection  40 mg Subcutaneous Q24H  . erythromycin   Both Eyes Q6H  . feeding supplement (PRO-STAT SUGAR FREE 64)  30 mL Per Tube BID  . free water  200 mL Per Tube Q8H  . insulin aspart  0-9 Units Subcutaneous Q4H  . Ipratropium-Albuterol  1 puff Inhalation QID  . mouth rinse  15 mL Mouth Rinse BID  . vitamin C  500 mg Oral Daily  . zinc sulfate  220 mg Oral Daily   Continuous Infusions: . albumin human    . feeding supplement (OSMOLITE 1.2 CAL) 55 mL/hr at 05/14/19 0400     LOS: 11 days        Dennys Guin Annett Gula, MD

## 2019-05-14 NOTE — Plan of Care (Addendum)
Attempted to feed patient few bites of Applesauce  - prior to her tray arrival.  Bites were small, patinet sitting 90 degrees and gently placed in her mouth.  Patient wanted it, but after just a few bites it was evident she was struggling to breath. SPO2 fell into the upper 60s quite rapidly and RR climbed to the 30s/patient was in resp distress.   The monitor also indicated runs of VTACH and VFIB 200s at times - but not sustaining.  Dr. Was notifed several times. Placed on 6LNC, then HF15L, then NRB 15L (needed d/t being a heavy mouth breather). Unfortunately, Spo2 remained in the 60s. Order to place on Heated HF as needed. - which would need transfer to another room.  Changed to NPO Attempted Mouth suction 2x.  Patient room 135 to obtain Heated HF as needed.  Once arrived in room, Patient had resumed back to baseline.  HFNC 6L at 100% SPO2.  Patient appeared comfortable and no longer in distress.    Dr. Informed and Dr. Florene Glen also rounded to assess. Metoprolol PRN ordered as needed. Will continue to assess and treat as needed.    1950 Spoke to son to give update and awareness that mom is back to baseline.  Informed him that she did need to be placed on NRB, but is now  back to HFNC 6L and stable (100% SPO2) at this time.  Also made him aware that if patient aspirated - she may decline within the next shift.  3rd shift RN informed of situation and will continue to monitor as needed. Son also asked to call with any additional questions. Bradd Burner, RN (3rd shift RN) also listened in on this call.

## 2019-05-14 NOTE — Plan of Care (Signed)
Spoke with son to update on patient status and POC.  He also requested  To speak with Dr. Marland Kitchen Text to inform.  No further questions at this time.

## 2019-05-15 ENCOUNTER — Inpatient Hospital Stay (HOSPITAL_COMMUNITY): Payer: Medicare Other

## 2019-05-15 LAB — CBC WITH DIFFERENTIAL/PLATELET
Abs Immature Granulocytes: 0.09 10*3/uL — ABNORMAL HIGH (ref 0.00–0.07)
Basophils Absolute: 0 10*3/uL (ref 0.0–0.1)
Basophils Relative: 0 %
Eosinophils Absolute: 0.1 10*3/uL (ref 0.0–0.5)
Eosinophils Relative: 1 %
HCT: 27.3 % — ABNORMAL LOW (ref 36.0–46.0)
Hemoglobin: 9.1 g/dL — ABNORMAL LOW (ref 12.0–15.0)
Immature Granulocytes: 1 %
Lymphocytes Relative: 14 %
Lymphs Abs: 1.4 10*3/uL (ref 0.7–4.0)
MCH: 34.2 pg — ABNORMAL HIGH (ref 26.0–34.0)
MCHC: 33.3 g/dL (ref 30.0–36.0)
MCV: 102.6 fL — ABNORMAL HIGH (ref 80.0–100.0)
Monocytes Absolute: 0.5 10*3/uL (ref 0.1–1.0)
Monocytes Relative: 5 %
Neutro Abs: 8 10*3/uL — ABNORMAL HIGH (ref 1.7–7.7)
Neutrophils Relative %: 79 %
Platelets: 236 10*3/uL (ref 150–400)
RBC: 2.66 MIL/uL — ABNORMAL LOW (ref 3.87–5.11)
RDW: 15.6 % — ABNORMAL HIGH (ref 11.5–15.5)
WBC: 10.1 10*3/uL (ref 4.0–10.5)
nRBC: 0 % (ref 0.0–0.2)

## 2019-05-15 LAB — BASIC METABOLIC PANEL
Anion gap: 10 (ref 5–15)
BUN: 35 mg/dL — ABNORMAL HIGH (ref 8–23)
CO2: 19 mmol/L — ABNORMAL LOW (ref 22–32)
Calcium: 8.5 mg/dL — ABNORMAL LOW (ref 8.9–10.3)
Chloride: 107 mmol/L (ref 98–111)
Creatinine, Ser: 0.7 mg/dL (ref 0.44–1.00)
GFR calc Af Amer: >60 mL/min (ref >60)
GFR calc non Af Amer: >60 mL/min (ref >60)
Glucose, Bld: 107 mg/dL — ABNORMAL HIGH (ref 70–99)
Potassium: 4.2 mmol/L (ref 3.5–5.1)
Sodium: 136 mmol/L (ref 135–145)

## 2019-05-15 LAB — GLUCOSE, CAPILLARY
Glucose-Capillary: 93 mg/dL (ref 70–99)
Glucose-Capillary: 85 mg/dL (ref 70–99)
Glucose-Capillary: 101 mg/dL — ABNORMAL HIGH (ref 70–99)
Glucose-Capillary: 93 mg/dL (ref 70–99)
Glucose-Capillary: 101 mg/dL — ABNORMAL HIGH (ref 70–99)

## 2019-05-15 MED ORDER — SODIUM CHLORIDE 0.9 % IV SOLN
1.0000 g | INTRAVENOUS | Status: AC
  Administered 2019-05-15 – 2019-05-19 (×5): 1 g via INTRAVENOUS
  Filled 2019-05-15 (×5): qty 10

## 2019-05-15 MED ORDER — DEXTROSE IN LACTATED RINGERS 5 % IV SOLN
INTRAVENOUS | Status: DC
  Administered 2019-05-15 – 2019-05-25 (×11): via INTRAVENOUS

## 2019-05-15 MED ORDER — KETOROLAC TROMETHAMINE 15 MG/ML IJ SOLN
15.0000 mg | Freq: Three times a day (TID) | INTRAMUSCULAR | Status: DC | PRN
  Administered 2019-05-15 – 2019-05-16 (×3): 15 mg via INTRAVENOUS
  Filled 2019-05-15 (×4): qty 1

## 2019-05-15 NOTE — Progress Notes (Addendum)
Call to patient's son for update. Informed son no noted change from last update. Son aware that patient is NPO at this time. Pt responds verbally at times stating "yes ma'am or no ma'am regardless of question. Son states this is baseline. Son aware patient remains on 6L HFNC. Son frequently speaks of pending legal complaint at patients previous facility. Turned and positioned every 2 hours.  0600 Pt has remained stable throughout the night. Turned and positioned every 2 hors with 2 assist. Dressings remain clean and dry. No change noted in patient. At times responds vrebally., and generally makes eys contact.

## 2019-05-15 NOTE — Progress Notes (Signed)
PROGRESS NOTE    CHASSIE PENNIX  FOY:[id number] DOB: 12-26-1940 DOA: 05/03/2019 PCP: Patient, No Pcp Per    Brief Narrative:  78 year old female who presented with confusion. She does have significant past medical history of advanced dementia, hypertension and coronary artery disease. She was noted to have worsening confusion for several days, she was unable to provide direct history due to cognitive impairment. On her initial physical examination her blood pressure was 136/83, pulse rate 80, respiratory 12, temperature 96.5, oxygen saturation 92%.Moist mucous membranes, lungs clear to auscultation bilaterally, heart S1-S2 present rhythmic, abdomen soft, no lower extremity edema. 16th, potassium 4.9, chloride 130, bicarb 18, glucose 187, BUN 171, creatinine 5.35, white count 30.3, hemoglobin 13.0, hematocrit 42.3, platelets 479.SARS COVID-19 was positive.Urinalysis more than 50 white cells, more than 50 red cells, specific gravity 1.020, protein more than 300.Head CT with no acute changes, chronic encephalomalacia of the right anterior superior frontal lobe, left anterior inferior frontal lobe and left anterior temporal lobe. Chest radiograph with a right lower lobe infiltrate.  Patient was admitted to the hospital with a working diagnosis of sepsis(organ failure encephalopathy, acute kidney injury andacutehypoxic respiratory failure),due to urine tract infection, in the setting of SARS COVID-19 viral pneumonia.  Patient has received treatment with antibiotic therapy, antivirals and systemic corticosteroids. Her urine culture was positive for Proteus mirabilis. Her kidney function has improved, along with his hypernatremia.   Patient has severe deconditioning and rapid decline in her physical functional status, currently unable to eat due to swallow dysfunction  I spoke with patient's son over the phone, we talked about patient's condition, and persistent swallow  dysfunction.  At this point considering patient's quality of life will remove NG tube and will proceed with careful feedings by mouth.  Palliative feedings with the focus on keeping patient comfortable and avoid suffering.  Continue close monitoring p.o. intake.  His son agrees with this plan, he is aware of risk of aspiration but currently will he agrees in focusing in patient's comfort and avoid suffering.  11/07, patient placed on palliative feedings, she did not tolerate any by mouth intake, developing respiratory distress and hypoxemia, required increase in oxygen requirements to 6 L per high flow nasal cannula.  At this point her prognosis is very poor considering unable to take p.o. intake and generalized deconditioning.  Changed to n.p.o.    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 Suspected aspiration pneumonia/ pneumonitis with acute hypoxic respiratory failure. Patient very weak and deconditioned, she can not have anything by mouth due to risk for aspiration, she has a very poor prognosis. She is DNR. Will repeat chest film, add antibiotic therapy with ceftriaxone (patient allergic to penicillin), check cell count and continue supplemental oxygen per high flow nasal cannula.   Will continue patient NPO for now and will add IV fluids with dextrose and LR. Patient is elderly, very deconditioned and non ambulatory. Likely she dose have high risk for aspiration even with tube feedings as documented event on 11/06  when she was noted to have  increase work of breathing. Patient is candidate for palliative care to avoid further suffering, for the last few days she looks in distress and I have mentioned that to her son. I have the impression that her son has false expectation about patient's condition. I have talked to nursing, to help coordinate a video call between Mrs Malita and her son, I think is important him to see his mom.   1. SARS COVID 19 viral pneumonia.Completed treatment with Remdesivir and dexamethasone. She had a very poor baseline and currently having significant sequale form her severe viral infection SARS COVID 19. She does have a poor prognosis.   2. Metabolic encephalopathy in the setting of advanced dementia.Positive baseline advanced dementia.Ct head has significant encephalomalacia. Continue to have poor communication and in noted in physical distress. Patient is candidate for palliative care.   3. Urinary tract infection with sepsis, organ failure encephalopathy.Completed therapy withIV cephalosporin.Clinically has resolved.   4. AKI and hypernatremia.Will follow up on renal function today.  5. Severe calorie protein malnutrition with hypophosphatemia.For now will continue patient NPO and will add IV fluids with dextrose. Not able to eat due to severe deconditioning, likely risk of aspiration even with tube feedings per NG.  I did spoke with patient's son about feeding tube option, and specified that that PEG likely will not prevent future aspirations, that she can still have aspiration with this form of nutrition. .  6. T2DM/ hypoglycemia.capillary glucose has been as low as 85, will continue IV dextrose for now.   7. Conjunctivitis.On topical antibiotic therapy.  8. Multiple pressure ulcers: (present on admission) Left ankle, right heal and sacrum (deep tissue) unable to stage. Right buttock stage 1. Local wound care. Patient not ambulatory with very poor prognosis,  continue with local wound care.  DVT prophylaxis:enoxaparin Code Status:DNR Family Communication: I spoke with patient's son over the phone yesterday evening and he was very upset and aggressive, I have the impression he has false expectations, per report from my colleagues, he has been made aware that she has a poor prognosis and that she may not survive this hospitalization. I personally discuss with him, patient's condition, severe weakness, risk for aspiration, patient being in distress and current quality or life.     Body mass index is 23.23 kg/m. Malnutrition Type:  Nutrition Problem: Increased nutrient needs Etiology: acute illness   Malnutrition Characteristics:  Signs/Symptoms: estimated needs   Nutrition Interventions:  Interventions: Tube feeding, Prostat  RN Pressure Injury Documentation: Pressure Injury 05/03/19 Ankle Left Deep Tissue Injury - Purple or maroon localized area of discolored intact skin or blood-filled blister due to damage of underlying soft tissue from pressure and/or shear. (Active)  05/03/19 0045  Location: Ankle  Location Orientation: Left  Staging: Deep Tissue Injury - Purple or maroon localized area of discolored intact skin or blood-filled blister due to damage of underlying soft tissue from pressure and/or shear.  Wound Description (Comments):   Present on Admission: Yes     Pressure Injury 05/03/19 Heel Right Deep Tissue Injury - Purple or maroon localized area of discolored intact skin or blood-filled blister due to damage of underlying soft tissue from pressure and/or shear. (Active)  05/03/19 0045  Location: Heel  Location Orientation: Right  Staging: Deep Tissue Injury - Purple or maroon localized area of discolored intact skin or blood-filled blister due to damage of underlying soft tissue from pressure and/or shear.  Wound Description (Comments):   Present on Admission:  Yes     Pressure Injury 05/03/19 Sacrum Deep Tissue  Injury - Purple or maroon localized area of discolored intact skin or blood-filled blister due to damage of underlying soft tissue from pressure and/or shear. (Active)  05/03/19 0045  Location: Sacrum  Location Orientation:   Staging: Deep Tissue Injury - Purple or maroon localized area of discolored intact skin or blood-filled blister due to damage of underlying soft tissue from pressure and/or shear.  Wound Description (Comments):   Present on Admission:      Pressure Injury 05/03/19 Buttocks Right;Circumferential Stage I -  Intact skin with non-blanchable redness of a localized area usually over a bony prominence. From brief elastic (Active)  05/03/19 0045  Location: Buttocks  Location Orientation: Right;Circumferential  Staging: Stage I -  Intact skin with non-blanchable redness of a localized area usually over a bony prominence.  Wound Description (Comments): From brief elastic  Present on Admission: Yes     Consultants:     Procedures:     Antimicrobials:   Ceftriaxone     Subjective: Patient responds to yes to some questions, does not seem to understand what is being asked. She looks not comfortable.   Objective: Vitals:   05/15/19 0030 05/15/19 0128 05/15/19 0300 05/15/19 0400  BP:  103/66  (!) 103/58  Pulse: 92 92 83 72  Resp:      Temp:  99 F (37.2 C)  99.2 F (37.3 C)  TempSrc:  Axillary    SpO2: 100% 100% 100% 100%  Weight:      Height:        Intake/Output Summary (Last 24 hours) at 05/15/2019 0746 Last data filed at 05/15/2019 0400 Gross per 24 hour  Intake 0 ml  Output 1150 ml  Net -1150 ml   Filed Weights   05/11/19 0800 05/12/19 0500 05/13/19 0510  Weight: 61.3 kg 62.7 kg 61.4 kg    Examination:   General: deconditioned and ill looking appearing, continue to have increase respiratory effort, like last few days that has been under my care.  Neurology: Eyes open, not following commands.  E ENT: positive pallor, no icterus, oral mucosa  moist.  Cardiovascular: No JVD. S1-S2 present, rhythmic, no gallops, rubs, or murmurs. No lower extremity edema. Pulmonary: vesicular breath sounds bilaterally, scattered ronchi Gastrointestinal. Abdomen with, no organomegaly, non tender, no rebound or guarding Skin. No rashes Musculoskeletal: no joint deformities     Data Reviewed: I have personally reviewed following labs and imaging studies  CBC: Recent Labs  Lab 05/09/19 0536 05/10/19 0415 05/11/19 0410 05/12/19 0455  WBC 24.2* 18.3* 17.8* 15.7*  NEUTROABS 21.5* 16.1* 15.5* 13.4*  HGB 10.0* 8.8* 8.6* 8.4*  HCT 32.2* 28.5* 26.8* 26.1*  MCV 108.4* 106.7* 104.7* 103.6*  PLT 351 324 282 286   Basic Metabolic Panel: Recent Labs  Lab 05/09/19 0530  05/10/19 0415 05/11/19 0410 05/12/19 0455 05/13/19 0035 05/14/19 0127  NA  --    < > 144 137 137 136 134*  K  --    < > 4.3 4.7 5.0 5.3* 4.8  CL  --    < > 118* 109 107 104 104  CO2  --    < > 20* 21* 21* 22 20*  GLUCOSE  --    < > 147* 169* 98 59* 116*  BUN  --    < > 40* 44* 42* 47* 51*  CREATININE  --    < > 0.72 0.61 0.53 0.56 0.68  CALCIUM  --    < >  7.9* 7.6* 7.8* 8.1* 8.1*  MG 2.2  --  1.6* 1.8 1.6*  --   --   PHOS 3.2  --  2.4* 1.8* 2.5  --   --    < > = values in this interval not displayed.   GFR: Estimated Creatinine Clearance: 49.2 mL/min (by C-G formula based on SCr of 0.68 mg/dL). Liver Function Tests: Recent Labs  Lab 05/10/19 0415 05/11/19 0410 05/12/19 0455 05/13/19 0035 05/14/19 0127  AST 36 32 31 35 46*  ALT 31 29 29  36 51*  ALKPHOS 79 67 62 76 84  BILITOT 0.4 0.8 0.7 0.5 0.4  PROT 5.6* 5.5* 5.4* 5.9* 5.6*  ALBUMIN 2.1* 2.5* 2.4* 2.5* 2.3*   No results for input(s): LIPASE, AMYLASE in the last 168 hours. No results for input(s): AMMONIA in the last 168 hours. Coagulation Profile: No results for input(s): INR, PROTIME in the last 168 hours. Cardiac Enzymes: No results for input(s): CKTOTAL, CKMB, CKMBINDEX, TROPONINI in the last 168  hours. BNP (last 3 results) No results for input(s): PROBNP in the last 8760 hours. HbA1C: No results for input(s): HGBA1C in the last 72 hours. CBG: Recent Labs  Lab 05/14/19 0757 05/14/19 1151 05/14/19 1553 05/14/19 2326 05/15/19 0414  GLUCAP 92 132* 133* 92 93   Lipid Profile: No results for input(s): CHOL, HDL, LDLCALC, TRIG, CHOLHDL, LDLDIRECT in the last 72 hours. Thyroid Function Tests: No results for input(s): TSH, T4TOTAL, FREET4, T3FREE, THYROIDAB in the last 72 hours. Anemia Panel: No results for input(s): VITAMINB12, FOLATE, FERRITIN, TIBC, IRON, RETICCTPCT in the last 72 hours.    Radiology Studies: I have reviewed all of the imaging during this hospital visit personally     Scheduled Meds: . enoxaparin (LOVENOX) injection  40 mg Subcutaneous Q24H  . erythromycin   Both Eyes Q6H  . Ipratropium-Albuterol  1 puff Inhalation QID  . mouth rinse  15 mL Mouth Rinse BID  . vitamin C  500 mg Oral Daily  . zinc sulfate  220 mg Oral Daily   Continuous Infusions: . albumin human       LOS: 12 days        Mauricio Gerome Apley, MD

## 2019-05-15 NOTE — Progress Notes (Addendum)
Call to update son, Gerald Stabs, no answer, no message left. Patient without noted change except some paine which prn Toradol was given for. Pt unable to indicate source of pain but does say "yes" when asked if she is in pain and has facial grimacing.  2130 Patient's son returned call to RN. Identified self. RN gave update. Son made aware patient had episode of pain, got Toradol. Also aware patient with dextrose in IV and need for further decisions about nutrition soon. Son insists he wants full speech therapy workup. Reminded son of patients difficulty following directions. MD aware of need to followup Monday. Son aware he can call nurse phone throughout night.  05-16-2019 No significant change noted throughout night. Pt alerat and awake much of shift. Toradol given 2 times with effectiveness noted. Pt continues to respond. Foam dressings changed after bath including: coccy area- necrotica area with small open agea), right heel- dti, left lateral anke- dti, v shaped skin tear rgiht arm, scratch to left hip. Respiration remain even and unlabored. Patient continues to respond verbally at times

## 2019-05-15 NOTE — Progress Notes (Addendum)
Called patient's son Gerald Stabs for daily update. Explained that patient was not verbalizing much, not following commands and did not pass a bedside swallow eval. Pt's son wants speech therapy to see the patient. Explained that patient wasn't following commands. Patient's son stated she was scared because of "things she had been through." Paged MD regarding son's request for SLP consult. Patient's son declined video call to see his mother at this time as he is at work. Stated tomorrow would be better for him. Notified charge nurse to e-link call tomorrow.   16:20 Patient's sister June called asking for information. Called patient's son Gerald Stabs who allowed this Probation officer to acknowledge that the patient is stable, but any further information would have to come from [place]. Called June back and asked her to contact patient's son Gerald Stabs for more information.

## 2019-05-15 NOTE — Progress Notes (Signed)
BG this AM is 85. Notified provider per order parameters. MD will place orders.

## 2019-05-15 NOTE — Progress Notes (Signed)
Pharmacy Antibiotic Note  Tiffany Turner is a 78 y.o. female admitted on 05/03/2019 with COVID-19 pneumonia and UTI.  She previously completed 7 days of antibiotics.  Pharmacy has been consulted for Unasyn dosing for aspiration pneumonia, but due to PCN allergy, will change to Ceftriaxone.  She has previously tolerated ceftriaxone.  Plan: Ceftriaxone 1g IV q24h. Pharmacy to sign off.   Height: 5\' 4"  (162.6 cm) Weight: 135 lb 5.8 oz (61.4 kg) IBW/kg (Calculated) : 54.7  Temp (24hrs), Avg:99.1 F (37.3 C), Min:98.9 F (37.2 C), Max:99.2 F (37.3 C)  Recent Labs  Lab 05/09/19 0536 05/10/19 0415 05/11/19 0410 05/12/19 0455 05/13/19 0035 05/14/19 0127  WBC 24.2* 18.3* 17.8* 15.7*  --   --   CREATININE 0.81 0.72 0.61 0.53 0.56 0.68    Estimated Creatinine Clearance: 49.2 mL/min (by C-G formula based on SCr of 0.68 mg/dL).    Allergies  Allergen Reactions  . Penicillins Other (See Comments)    Has patient had a PCN reaction causing immediate rash, facial/tongue/throat swelling, SOB or lightheadedness with hypotension: Unknown Has patient had a PCN reaction causing severe rash involving mucus membranes or skin necrosis: Unknown Has patient had a PCN reaction that required hospitalization: Unknown Has patient had a PCN reaction occurring within the last 10 years: Unknown If all of the above answers are "NO", then may proceed with Cephalosporin use. Tolerated ceftriaxone  . Sulfa Antibiotics Other (See Comments)    unknown    Antimicrobials this admission: 10/26 Vancomycin >> 10/28 10/26 Aztreonam >> 10/28 10/28 Ceftriaxone >> 11/1, resumed 11/8 >>   Dose adjustments this admission:   Microbiology results: 10/26 [facility]x [facility]): negF 10/26 [facility]x [facility]): >100k proteus  10/29 MRSA PCR: negative 10/29 SARS CoV2: positive  Thank you for allowing pharmacy to be a part of this patient's care.  Leeroy Bock 05/15/2019 12:06 PM

## 2019-05-16 LAB — GLUCOSE, CAPILLARY
Glucose-Capillary: 105 mg/dL — ABNORMAL HIGH (ref 70–99)
Glucose-Capillary: 108 mg/dL — ABNORMAL HIGH (ref 70–99)
Glucose-Capillary: 115 mg/dL — ABNORMAL HIGH (ref 70–99)
Glucose-Capillary: 95 mg/dL (ref 70–99)
Glucose-Capillary: 95 mg/dL (ref 70–99)
Glucose-Capillary: 95 mg/dL (ref 70–99)
Glucose-Capillary: 99 mg/dL (ref 70–99)

## 2019-05-16 NOTE — Progress Notes (Signed)
I spoke with the patient's son Tiffany Turner about Mrs. Tiffany Turner condition, I explained him about her critical clinical situation, very fragile and very high risk of death.  Currently to me and to the nursing staff patient is suffering, she seems to be crying at times and very uncomfortable.  She has been receiving IV fluids that would not be sufficient to maintain her nutrition.  Without any p.o. intake her condition will continue to progress and deteriorate, leading to an imminent death.  Her chances of recovery are very low, considering her age, comorbidities and recent SARS COVID-19 viral pneumonia.  I explained him that we as healthcare providers want to assure Ms. Tiffany Turner is not suffering and would like him to consider palliative care and comfort measures.  He will call [phone] to arrange a video call to see his mom.  I also encouraged to have the rest the family be on the call.  I think this will help Tiffany Turner to make a decision.

## 2019-05-16 NOTE — Evaluation (Addendum)
Clinical/Bedside Swallow Evaluation Patient Details  Name: Tiffany Turner MRN: [id number] Date of Birth: 1941/05/21  Today's Date: 05/16/2019 Time: SLP Start Time (ACUTE ONLY): 53 SLP Stop Time (ACUTE ONLY): 0927 SLP Time Calculation (min) (ACUTE ONLY): 12 min  Past Medical History:  Past Medical History:  Diagnosis Date  . Coronary artery disease    stents placed  . Dementia ([place])   . Hip dislocation, right (HCC)    hx of dislocation  . Hyperlipemia   . Hypertension    Past Surgical History:  Past Surgical History:  Procedure Laterality Date  . CARDIAC SURGERY     stents  . CORONARY ANGIOPLASTY WITH STENT PLACEMENT    . TOTAL HIP ARTHROPLASTY Right    HPI:  Pt is a 78 y.o. female presenting with AMS, who was admitted with sepsis due to UTI in the setting of COVID-19.  NGT was placed 10/31 but after discussion about overall condition and persistent swallow dysfunction, son made the decision to remove NGT and proceed with careful PO feedings, acknowledging the risk of aspiration but wanting to focus on comfort. On 11/7 pt was made NPO due to not tolerating PO intake, described as developing respiratory distress and hypoxemia. Pt had a previous BSE in 2019 that recommended Dys 2 diet and thin liquids due to mildly prolonged mastication/oral clearance. Upon f/u pt was primarily only eating pureed food on trays. PMH: advanced dementia, HTN, CAD   Assessment / Plan / Recommendation Clinical Impression  Pt was seen for swallow evaluation, but is making no attempts this morning to orally accept or manipulate boluses. Attempted to provide various consistencies and flavors with different delivery methods. SLP also provided hand-over-hand assist for self-feeding to trying to increase awareness and automaticity of task. Boluses were placed passively on the anterior portion of her tongue as she sits with a mouth-open posture, but her mandibular/lingual tremor results in anterior spillage of  each bolus in its entirety. Pt appears to have an acute on chronic dysphagia given note of mild oral (?cognitively based) dysphagia documented in 2019. Would recommend establishing GOC in regards to likely chronic risk.   ADDENDUM: SLP reached out to pt's son, Gerald Stabs, to review results of swallow evaluation today. He shares that pt had been eating regular solids and thin liquids PTA as far as he was aware - he also does not remember her having the oral tremor that I noted today, but acknowledges that he has not been able to see her this year. At this point he seems hopeful for additional SLP follow up - advised him that we will continue to see her for potential to safely return to POs, but that we would likely need to see a significant change in her mentation. We discussed the changes in swallowing that can occur with advanced dementia and sometimes the more significant declines that can occur with acute illness in addition to baseline cognitive deficits. He said that he would not want his mother to get a PEG. We talked about different options, including giving her a little more time to see if she makes any progress with swallowing (although she is currently not nutritionally supported) as well as offering her bites/sips for comfort as she is accepting. Encouraged him to continue to discuss this further with MD. SLP will continue to follow.  SLP Visit Diagnosis: Dysphagia, oral phase (R13.11)    Aspiration Risk  Severe aspiration risk;Risk for inadequate nutrition/hydration    Diet Recommendation NPO   Medication Administration: Via  alternative means    Other  Recommendations Oral Care Recommendations: Oral care QID Other Recommendations: Have oral suction available   Follow up Recommendations Skilled Nursing facility      Frequency and Duration min 2x/week  2 weeks       Prognosis Prognosis for Safe Diet Advancement: Fair Barriers to Reach Goals: Cognitive deficits;Time post onset       Swallow Study   General HPI: Pt is a 78 y.o. female presenting with AMS, who was admitted with sepsis due to UTI in the setting of COVID-19.  NGT was placed 10/31 but after discussion about overall condition and persistent swallow dysfunction, son made the decision to remove NGT and proceed with careful PO feedings, acknowledging the risk of aspiration but wanting to focus on comfort. On 11/7 pt was made NPO due to not tolerating PO intake, described as developing respiratory distress and hypoxemia. Pt had a previous BSE in 2019 that recommended Dys 2 diet and thin liquids due to mildly prolonged mastication/oral clearance. Upon f/u pt was primarily only eating pureed food on trays. PMH: advanced dementia, HTN, CAD Type of Study: Bedside Swallow Evaluation Previous Swallow Assessment: see HPI Diet Prior to this Study: NPO Temperature Spikes Noted: No Respiratory Status: Nasal cannula History of Recent Intubation: No Behavior/Cognition: Alert;Doesn't follow directions Oral Cavity Assessment: Dry Oral Care Completed by SLP: Yes Oral Cavity - Dentition: Edentulous Self-Feeding Abilities: Total assist Patient Positioning: Upright in bed Baseline Vocal Quality: Low vocal intensity Volitional Swallow: Unable to elicit    Oral/Motor/Sensory Function Overall Oral Motor/Sensory Function: (doesn't follow commands, seems symmetrical but has tremor)   Ice Chips Ice chips: Not tested   Thin Liquid Thin Liquid: Impaired Presentation: (siphoned via straw) Oral Phase Impairments: Poor awareness of bolus;Reduced labial seal;Reduced lingual movement/coordination Oral Phase Functional Implications: Other (comment)(anterior spillage of entire bolus)    Nectar Thick Nectar Thick Liquid: Impaired Presentation: Spoon Oral Phase Impairments: Reduced labial seal;Reduced lingual movement/coordination;Poor awareness of bolus Oral phase functional implications: Other (comment)(anterior spillage of entire bolus)    Honey Thick Honey Thick Liquid: Not tested   Puree Puree: Impaired Presentation: Spoon Oral Phase Impairments: Reduced labial seal;Reduced lingual movement/coordination;Poor awareness of bolus Oral Phase Functional Implications: Other (comment)(anterior spillage of bolus)   Solid     Solid: Not tested      Virl Axe Ramell Wacha 05/16/2019,9:34 AM  Ivar Drape, M.A. CCC-SLP Acute Herbalist [phone] Office [phone]

## 2019-05-16 NOTE — Plan of Care (Signed)
  Problem: Respiratory: Goal: Will maintain a patent airway Outcome: Progressing Goal: Complications related to the disease process, condition or treatment will be avoided or minimized Outcome: Progressing   Problem: Education: Goal: Knowledge of risk factors and measures for prevention of condition will improve Outcome: Not Progressing   Problem: Coping: Goal: Psychosocial and spiritual needs will be supported Outcome: Not Progressing   Problem: Nutrition: Goal: Adequate nutrition will be maintained Outcome: Not Progressing   Problem: Pain Managment: Goal: General experience of comfort will improve Outcome: Not Progressing

## 2019-05-16 NOTE — Progress Notes (Signed)
Pt's son was called and updated regarding pt's condition. He appreciated the update. Will continue to monitor

## 2019-05-16 NOTE — Progress Notes (Signed)
PROGRESS NOTE    Tiffany Turner  WUJ:[id number] DOB: 04/10/1941 DOA: 05/03/2019 PCP: Patient, No Pcp Per    Brief Narrative:  78 year old female who presented with confusion. She does have significant past medical history of advanced dementia, hypertension and coronary artery disease. She was noted to have worsening confusion for several days, she was unable to provide direct history due to cognitive impairment. On her initial physical examination her blood pressure was 136/83, pulse rate 80, respiratory rate 12, temperature 96.5, oxygen saturation 92%.Moist mucous membranes, lungs clear to auscultation bilaterally, heart S1-S2 present rhythmic, abdomen soft, no lower extremity edema. Sodium 168 , potassium 4.9, chloride 130, bicarb 18, glucose 187, BUN 171, creatinine 5.35, white count 30.3, hemoglobin 13.0, hematocrit 42.3, platelets 479.SARS COVID-19 was positive.Urinalysis more than 50 white cells, more than 50 red cells, specific gravity 1.020, protein more than 300.Head CT with no acute changes, chronic encephalomalacia of the right anterior superior frontal lobe, left anterior inferior frontal lobe and left anterior temporal lobe. Chest radiograph with a right lower lobe infiltrate.  Patient was admitted to the hospital with a working diagnosis of sepsis(organ failure encephalopathy, acute kidney injury andacutehypoxic respiratory failure),due to urinary tract infection, in the setting of SARS COVID-19 viral pneumonia.  Patient has received treatment with antibiotic therapy, antivirals and systemic corticosteroids. Her urine culture was positive for Proteus mirabilis. Her kidney function has improved, along with his hypernatremia.  Patient had an NG tube place and started on tube feedings.    Patient has severe deconditioning and rapid decline in her physical functional status, currently unable to eat due to swallow dysfunction  I spoke with patient's son over  the phone, we talkedabout patient's condition, and persistent swallow dysfunction. At this point considering patient's quality of life will remove NG tube and will proceed with careful feedings by mouth. Palliative feedings with the focus on keeping patient comfortable and avoid suffering.Continue close monitoring p.o. intake.His son agrees with this plan,he is aware of risk of aspiration but currently willhe agrees infocusing in patient's comfort and avoid suffering.  11/07, patient placed on palliative feedings, she did not tolerate any by mouth intake, developing respiratory distress and hypoxemia, required increase in oxygen requirements to 6 L per high flow nasal cannula.  At this point her prognosis is very poor considering unable to take p.o. intake and generalized deconditioning.  Changed to n.p.o.  Follow up chest film with interstitial infiltrate at the right base, started on Ceftriaxone and continue with IV dextrose. Speech has been consulted and confirmed patient not able to eat by mouth and recommendation for palliative care.   Patient is elderly, very deconditioned and non ambulatory. Likely she dose have high risk for aspiration even with tube feedings as documented event on 11/06 when she was noted to have  increase work of breathing. Patient is candidate for palliative care to avoid further suffering, for the last few days she looks in distress and I have mentioned that to her son. I have the impression that her son has false expectation about patient's condition. I have talked to nursing, to help coordinate a video call between Mrs Tiffany Turner and her son, I think is important him to see his mom.    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 Suspected aspiration pneumonia/ pneumonitis with acute hypoxic respiratory failure. Patient continue to be very weak and deconditioned, continue NPO, speech therapy recommends to review goals of care.Today her oxymetry is back to 100% on room air. WBC is 10,1. Continue antibiotic therapy with ceftriaxone (patient allergic to penicillin). Aspiration precautions.   Continue with IV fluids with dextrose and LR. Patient is at high risk of recurrent aspiration, and rapid decline in her condition, the chances of recovery are very low. Will ask nursing to please coordinate a video call with her son and will consult palliative care team. I have reviewed the note from speech therapy.   2. SARS COVID 19 viral pneumonia.Completed treatment with Remdesivir and dexamethasone.Very poor baseline and currently continue having significant sequale form her severe viral infection SARS COVID 19. Continue supportive medical care.    3. Metabolic encephalopathy in the setting of advanced dementia.Positive baseline advanced dementia.Ct head has significant encephalomalacia.Continue to have poor communication and in noted in physical distress. Patient looks in intermittent distress, when awake, possible in pain, she is candidate for palliative care in the setting of current condition.    4. Urinary tract infection with sepsis, organ failure encephalopathy.Completed therapy withIV cephalosporin.Clinically has resolved.  5. AKI and hypernatremia. Renal function with serum cr at 0,70 with K at 4,2 and serum bicarbonate at 19. Will continue slow rate of balanced electrolyte solutions plus dextrose.   6. Severe calorie protein malnutrition with hypophosphatemia. Not able  to eat due to severe deconditioning, likely risk of aspiration even with tube feedings per NG. I do not think she is candidate for PEG tube that likely will prolong suffering, and does not avoid possible aspiration.  7. T2DM/ hypoglycemia. Fating glucose is 107. Will continue with IV dextrose.   8. Conjunctivitis.continue with topical antibiotic therapy.  9. Multiple pressure ulcers: (present on admission) Left ankle, right heal and sacrum (deep tissue) unable to stage. Right buttock stage 1.Local wound care. Patient not ambulatory with very poor prognosis, continue with local wound care.Patient not ambulatory with a very poor prognosis.   DVT prophylaxis:enoxaparin Code Status:DNR Family Communication: I spoke with patient's son over the phone evening of Nov 7and he was very upset and aggressive, I have the impression he has false expectations, per report from my colleagues, he has been made aware that patient has a poor prognosis and that she may not survive this hospitalization. I personally discuss with him, patient's condition, severe weakness, risk for aspiration, patient being in distress and current quality or life.     Body mass index is 22.48 kg/m. Malnutrition Type:  Nutrition Problem: Increased nutrient needs Etiology: acute illness   Malnutrition Characteristics:  Signs/Symptoms: estimated needs   Nutrition Interventions:  Interventions: Tube feeding, Prostat  RN Pressure Injury Documentation: Pressure Injury 05/03/19 Ankle Left Deep Tissue Injury - Purple or maroon localized area of discolored intact skin or blood-filled blister due to damage of underlying soft tissue from pressure and/or shear. (Active)  05/03/19 0045  Location: Ankle  Location Orientation: Left  Staging: Deep Tissue Injury - Purple or maroon localized area of discolored intact skin or blood-filled blister due to damage of underlying soft tissue from pressure and/or shear.  Wound  Description (Comments):  Present on Admission: Yes     Pressure Injury 05/03/19 Heel Right Deep Tissue Injury - Purple or maroon localized area of discolored intact skin or blood-filled blister due to damage of underlying soft tissue from pressure and/or shear. (Active)  05/03/19 0045  Location: Heel  Location Orientation: Right  Staging: Deep Tissue Injury - Purple or maroon localized area of discolored intact skin or blood-filled blister due to damage of underlying soft tissue from pressure and/or shear.  Wound Description (Comments):   Present on Admission: Yes     Pressure Injury 05/03/19 Sacrum Deep Tissue Injury - Purple or maroon localized area of discolored intact skin or blood-filled blister due to damage of underlying soft tissue from pressure and/or shear. (Active)  05/03/19 0045  Location: Sacrum  Location Orientation:   Staging: Deep Tissue Injury - Purple or maroon localized area of discolored intact skin or blood-filled blister due to damage of underlying soft tissue from pressure and/or shear.  Wound Description (Comments):   Present on Admission:      Pressure Injury 05/03/19 Buttocks Right;Circumferential Stage I -  Intact skin with non-blanchable redness of a localized area usually over a bony prominence. From brief elastic (Active)  05/03/19 0045  Location: Buttocks  Location Orientation: Right;Circumferential  Staging: Stage I -  Intact skin with non-blanchable redness of a localized area usually over a bony prominence.  Wound Description (Comments): From brief elastic  Present on Admission: Yes     Consultants:   Palliative Care.   Procedures:     Antimicrobials:   Ceftriaxone     Subjective: Patient is more calm today while sleeping, when awake noted to be in distress, possible pain. Not able to communicate. She has been NPO due to aspiration risk.   Objective: Vitals:   05/16/19 0009 05/16/19 0010 05/16/19 0422 05/16/19 0508  BP:      Pulse:   86    Resp:      Temp:   98 F (36.7 C)   TempSrc:   Axillary   SpO2: (!) 87% 100%    Weight:    59.4 kg  Height:        Intake/Output Summary (Last 24 hours) at 05/16/2019 0831 Last data filed at 05/16/2019 0506 Gross per 24 hour  Intake 1076.72 ml  Output 2000 ml  Net -923.28 ml   Filed Weights   05/12/19 0500 05/13/19 0510 05/16/19 0508  Weight: 62.7 kg 61.4 kg 59.4 kg    Examination:   General: deconditioned and ill looking appearing.  Neurology: opens eyes to voice, seems to be in distress when awake, not able to communicate.  E ENT: positive pallor, no icterus, oral mucosa moist Cardiovascular: No JVD. S1-S2 present, rhythmic, no gallops, rubs, or murmurs. No lower extremity edema. Pulmonary: positive breath sounds bilaterally. Decreased air movement, due to poor inspiratory effort.  Gastrointestinal. Abdomen with no organomegaly, non tender, no rebound or guarding Skin. No rashes Musculoskeletal: no joint deformities     Data Reviewed: I have personally reviewed following labs and imaging studies  CBC: Recent Labs  Lab 05/10/19 0415 05/11/19 0410 05/12/19 0455 05/15/19 1215  WBC 18.3* 17.8* 15.7* 10.1  NEUTROABS 16.1* 15.5* 13.4* 8.0*  HGB 8.8* 8.6* 8.4* 9.1*  HCT 28.5* 26.8* 26.1* 27.3*  MCV 106.7* 104.7* 103.6* 102.6*  PLT 324 282 286 236   Basic Metabolic Panel: Recent Labs  Lab 05/10/19 0415 05/11/19 0410 05/12/19 0455 05/13/19 0035 05/14/19 0127 05/15/19 1215  NA 144 137 137 136  134* 136  K 4.3 4.7 5.0 5.3* 4.8 4.2  CL 118* 109 107 104 104 107  CO2 20* 21* 21* 22 20* 19*  GLUCOSE 147* 169* 98 59* 116* 107*  BUN 40* 44* 42* 47* 51* 35*  CREATININE 0.72 0.61 0.53 0.56 0.68 0.70  CALCIUM 7.9* 7.6* 7.8* 8.1* 8.1* 8.5*  MG 1.6* 1.8 1.6*  --   --   --   PHOS 2.4* 1.8* 2.5  --   --   --    GFR: Estimated Creatinine Clearance: 49.2 mL/min (by C-G formula based on SCr of 0.7 mg/dL). Liver Function Tests: Recent Labs  Lab 05/10/19 0415  05/11/19 0410 05/12/19 0455 05/13/19 0035 05/14/19 0127  AST 36 32 31 35 46*  ALT 36 51*  ALKPHOS 79 67 62 76 84  BILITOT 0.4 0.8 0.7 0.5 0.4  PROT 5.6* 5.5* 5.4* 5.9* 5.6*  ALBUMIN 2.1* 2.5* 2.4* 2.5* 2.3*   No results for input(s): LIPASE, AMYLASE in the last 168 hours. No results for input(s): AMMONIA in the last 168 hours. Coagulation Profile: No results for input(s): INR, PROTIME in the last 168 hours. Cardiac Enzymes: No results for input(s): CKTOTAL, CKMB, CKMBINDEX, TROPONINI in the last 168 hours. BNP (last 3 results) No results for input(s): PROBNP in the last 8760 hours. HbA1C: No results for input(s): HGBA1C in the last 72 hours. CBG: Recent Labs  Lab 05/15/19 1621 05/15/19 1928 05/15/19 2355 05/16/19 0420 05/16/19 0746  GLUCAP 93 101* 95 99 95   Lipid Profile: No results for input(s): CHOL, HDL, LDLCALC, TRIG, CHOLHDL, LDLDIRECT in the last 72 hours. Thyroid Function Tests: No results for input(s): TSH, T4TOTAL, FREET4, T3FREE, THYROIDAB in the last 72 hours. Anemia Panel: No results for input(s): VITAMINB12, FOLATE, FERRITIN, TIBC, IRON, RETICCTPCT in the last 72 hours.    Radiology Studies: I have reviewed all of the imaging during this hospital visit personally     Scheduled Meds:  enoxaparin (LOVENOX) injection  40 mg Subcutaneous Q24H   erythromycin   Both Eyes Q6H   Ipratropium-Albuterol  1 puff Inhalation QID   mouth rinse  15 mL Mouth Rinse BID   vitamin C  500 mg Oral Daily   zinc sulfate  220 mg Oral Daily   Continuous Infusions:  cefTRIAXone (ROCEPHIN)  IV Stopped (05/15/19 1625)   dextrose 5% lactated ringers 50 mL/hr at 05/16/19 0506     LOS: 13 days        Mckenna Boruff Annett Gula, MD

## 2019-05-17 LAB — GLUCOSE, CAPILLARY
Glucose-Capillary: 87 mg/dL (ref 70–99)
Glucose-Capillary: 96 mg/dL (ref 70–99)
Glucose-Capillary: 96 mg/dL (ref 70–99)
Glucose-Capillary: 97 mg/dL (ref 70–99)

## 2019-05-17 MED ORDER — POLYVINYL ALCOHOL 1.4 % OP SOLN
1.0000 [drp] | Freq: Four times a day (QID) | OPHTHALMIC | Status: DC | PRN
  Filled 2019-05-17 (×2): qty 15

## 2019-05-17 MED ORDER — LORAZEPAM 2 MG/ML PO CONC: 1.0000 mg | ORAL | Status: DC | PRN

## 2019-05-17 MED ORDER — GLYCOPYRROLATE 0.2 MG/ML IJ SOLN: 0.2000 mg | INTRAMUSCULAR | Status: DC | PRN

## 2019-05-17 MED ORDER — LORAZEPAM 0.5 MG PO TABS: 1.0000 mg | ORAL_TABLET | ORAL | Status: DC | PRN

## 2019-05-17 MED ORDER — ACETAMINOPHEN 650 MG RE SUPP: 650.0000 mg | Freq: Four times a day (QID) | RECTAL | Status: DC | PRN

## 2019-05-17 MED ORDER — ONDANSETRON HCL 4 MG/2ML IJ SOLN: 4.0000 mg | Freq: Four times a day (QID) | INTRAMUSCULAR | Status: DC | PRN

## 2019-05-17 MED ORDER — GLYCOPYRROLATE 1 MG PO TABS
1.0000 mg | ORAL_TABLET | ORAL | Status: DC | PRN
  Filled 2019-05-17: qty 1

## 2019-05-17 MED ORDER — ONDANSETRON 4 MG PO TBDP: 4.0000 mg | ORAL_TABLET | Freq: Four times a day (QID) | ORAL | Status: DC | PRN

## 2019-05-17 MED ORDER — MORPHINE SULFATE (PF) 2 MG/ML IV SOLN
1.0000 mg | INTRAVENOUS | Status: DC | PRN
  Administered 2019-05-17 – 2019-05-20 (×13): 1 mg via INTRAVENOUS
  Filled 2019-05-17 (×13): qty 1

## 2019-05-17 MED ORDER — BIOTENE DRY MOUTH MT LIQD: 15.0000 mL | OROMUCOSAL | Status: DC | PRN

## 2019-05-17 MED ORDER — ACETAMINOPHEN 325 MG PO TABS: 650.0000 mg | ORAL_TABLET | Freq: Four times a day (QID) | ORAL | Status: DC | PRN

## 2019-05-17 MED ORDER — LORAZEPAM 2 MG/ML IJ SOLN
1.0000 mg | INTRAMUSCULAR | Status: DC | PRN
  Administered 2019-05-17 – 2019-05-20 (×7): 1 mg via INTRAVENOUS
  Filled 2019-05-17 (×7): qty 1

## 2019-05-17 MED ORDER — GLYCOPYRROLATE 0.2 MG/ML IJ SOLN
0.2000 mg | INTRAMUSCULAR | Status: DC | PRN
  Administered 2019-05-26: 0.2 mg via INTRAVENOUS
  Filled 2019-05-17: qty 1

## 2019-05-17 NOTE — Progress Notes (Signed)
  Speech Language Pathology Treatment: Dysphagia  Patient Details Name: Tiffany Turner MRN: [id number] DOB: 07/06/1941 Today's Date: 05/17/2019 Time: 0920-0928 SLP Time Calculation (min) (ACUTE ONLY): 8 min  Assessment / Plan / Recommendation Clinical Impression  Pt demonstrates no progress. Upon SLP arrival pt sleeping peacefully, but as soon as she woke up she began panting and tremor was severe. Attempted to comfort patient as she both seemed to be in pain and fearful. Used a piced of ice for tactile stim, rubbed on her lips and tip of her tongue (mouth open, mucosa dry). Pt did not make andy lingual movement of achieve labial seal with this, or with a spoon with applesauce pressed to tongue. Pt was noted to have intermittent wet coughing throughout, but was not capable of fully expectorating mucous. She did close her mouth after coughing and initiated a swallow of secretions. Overall it appears that pt is both incapable of swallow and uninterested in PO. Prognosis for "safe" or meaningful nutritional intake is poor.    HPI HPI: Pt is a 78 y.o. female presenting with AMS, who was admitted with sepsis due to UTI in the setting of COVID-19.  NGT was placed 10/31 but after discussion about overall condition and persistent swallow dysfunction, son made the decision to remove NGT and proceed with careful PO feedings, acknowledging the risk of aspiration but wanting to focus on comfort. On 11/7 pt was made NPO due to not tolerating PO intake, described as developing respiratory distress and hypoxemia. Pt had a previous BSE in 2019 that recommended Dys 2 diet and thin liquids due to mildly prolonged mastication/oral clearance. Upon f/u pt was primarily only eating pureed food on trays. PMH: advanced dementia, HTN, CAD      SLP Plan  Continue with current plan of care       Recommendations  Diet recommendations: NPO Medication Administration: Via alternative means                Oral Care  Recommendations: Oral care QID Follow up Recommendations: Skilled Nursing facility SLP Visit Diagnosis: Dysphagia, oral phase (R13.11) Plan: Continue with current plan of care       GO               Tiffany [place], MA [place] Pager [phone] Office [phone]  Lynann Beaver 05/17/2019, 9:54 AM

## 2019-05-17 NOTE — Progress Notes (Signed)
Pt's son called for update. Update was provided, and he wants to speak with the provider regarding pt's condition. Provider has been notified. Will continue to monitor.

## 2019-05-17 NOTE — Progress Notes (Signed)
PROGRESS NOTE    NAVYA TIMMONS  OZH:[id number] DOB: [birth year] DOA: 05/03/2019 PCP: Patient, No Pcp Per    Brief Narrative:  78 year old female who presented with confusion. She does have significant past medical history of advanced dementia, hypertension and coronary artery disease. She was noted to have worsening confusion for several days, she was unable to provide direct history due to cognitive impairment. On her initial physical examination her blood pressure was 136/83, pulse rate 80, respiratory rate 12, temperature 96.5, oxygen saturation 92%.Moist mucous membranes, lungs clear to auscultation bilaterally, heart S1-S2 present rhythmic, abdomen soft, no lower extremity edema. Sodium 168 , potassium 4.9, chloride 130, bicarb 18, glucose 187, BUN 171, creatinine 5.35, white count 30.3, hemoglobin 13.0, hematocrit 42.3, platelets 479.SARS COVID-19 was positive.Urinalysis more than 50 white cells, more than 50 red cells, specific gravity 1.020, protein more than 300.Head CT with no acute changes, chronic encephalomalacia of the right anterior superior frontal lobe, left anterior inferior frontal lobe and left anterior temporal lobe. Chest radiograph with a right lower lobe infiltrate.  Patient was admitted to the hospital with a working diagnosis of sepsis(organ failure encephalopathy, acute kidney injury andacutehypoxic respiratory failure),due to urinary tract infection, in the setting of SARS COVID-19 viral pneumonia.  Patient has received treatment with antibiotic therapy, antivirals and systemic corticosteroids. Her urine culture was positive for Proteus mirabilis. Her kidney function has improved, along with his hypernatremia.  Patient had an NG tube place and started on tube feedings.    Patient has severe deconditioning and rapid decline in her physical functional status, currently unable to eat due to swallow dysfunction  I spoke with patient's son over  the phone, we talkedabout patient's condition, and persistent swallow dysfunction. At this point considering patient's quality of life will remove NG tube and will proceed with careful feedings by mouth. Palliative feedings with the focus on keeping patient comfortable and avoid suffering.Continue close monitoring p.o. intake.His son agrees with this plan,he is aware of risk of aspiration but currently willhe agrees infocusing in patient's comfort and avoid suffering.  11/07,patient placed on palliative feedings, she did not tolerate any by mouth intake, developing respiratory distress and hypoxemia, required increase in oxygen requirements to 6 L per high flow nasal cannula. At this point her prognosis is very poor considering unable to take p.o. intake and generalized deconditioning. Changed to n.p.o.  Follow up chest film with interstitial infiltrate at the right base, started on Ceftriaxone and continue with IV dextrose. Speech has been consulted and confirmed patient not able to eat by mouth and recommendation for palliative care.   Patient is elderly, very deconditioned and non ambulatory. Likely she dose have high risk for aspiration even with tube feedings as documented event on 11/06 when she was noted to have increase work of breathing. Patient is candidate for palliative care to avoid further suffering, for the last few days she looks in distress and I have mentioned that to her son. I have the impression that her initially had a  false expectation about patient's condition. A video call between Mrs Samanthamarie and her son, was arranged, and her family was able to see her current condition.   Today I spoke with patient's son Irys Nigh, he was able to see his mother throgh video conference yesterday. He is willing to proceed with comfort care, will add as needed morphine and lorazepam, will continue for now current medical therapy but will not escalate any further.   Assessment  & Plan:  Principal Problem:   Sepsis secondary to UTI 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 Suspected aspiration pneumonia/ pneumonitis with acute hypoxic respiratory failure. Patient extremely  weak and deconditioned, not able to take anything NPO, Oxymetry continues to be at 100% on room air, at expense of persistent increased work of breathing. Will continue aspiration precautions.  Continue to be at very high risk of recurrent aspiration, and rapid decline in her condition. Will complete 5 days of antibiotic therapy for now as tolerated as dicussed with her son over the phone, but not further escalation of therapy.   Will add morphine and lorazepam for respiratory distress and pain.   2. SARS COVID 19 viral pneumonia.Completed treatment with Remdesivir and dexamethasone.Now with severesequaleform hersevere viral infectionSARS COVID 19. Now patient under comfort care.   3. Metabolic encephalopathy in the setting of advanced dementia.Positive baseline advanced dementia.Ct head has significant encephalomalacia.Now changed to comfort care, continue as needed morphine and lorazepam.  4. Urinary tract infection with sepsis, organ failure encephalopathy.Completed therapy withIV cephalosporin.Clinically has resolved.  5. AKI and hypernatremia. Will continue IV fluids for now, no further blood work due to comfort measures.  6. Severe calorie protein malnutrition with hypophosphatemia. Not able to eat due to severe deconditioning, likely risk of aspiration even  with tube feedings per NG or PEG. Will continue IV dextrose for now, palliative measure as tolerated.   7. T2DM/ hypoglycemia. Continue IV dextrose, glucose has remained stable, will continue to hold on insulin or glucose capillary monitoring.   8. Conjunctivitis.Topical antibiotic therapy.  9. Multiple pressure ulcers: (present on admission) Left ankle, right heal and sacrum (deep tissue) unable to stage. Right buttock stage 1.Local wound care. Patient not ambulatory with very poor prognosis, continue with local wound care.No transitioned to palliative care.  DVT prophylaxis:enoxaparin Code Status:DNR Family Communication:I spoke with patient's son, [name] about patient's condition, he has seen his mother through video conference yesterday, he has decided to transition to palliative care with the focus of avoiding suffering.  He is agreeing on using morphine and lorazepam for symptom control.  Will keep current medical regimen as tolerated with no further escalation.   Body mass index is 22.48 kg/m. Malnutrition Type:  Nutrition Problem: Increased nutrient needs Etiology: acute illness   Malnutrition Characteristics:  Signs/Symptoms: estimated needs   Nutrition Interventions:  Interventions: Tube feeding, Prostat  RN Pressure Injury Documentation: Pressure Injury 05/03/19 Ankle Left Deep Tissue Injury - Purple or maroon localized area of discolored intact skin or blood-filled blister due to damage of underlying soft tissue from pressure and/or shear. (Active)  05/03/19 0045  Location: Ankle  Location Orientation: Left  Staging: Deep Tissue Injury - Purple or maroon localized area of discolored intact skin or blood-filled blister due to damage of underlying soft tissue from pressure and/or shear.  Wound Description (Comments):   Present on Admission: Yes     Pressure Injury 05/03/19 Heel Right Deep Tissue Injury - Purple or maroon localized area of discolored  intact skin or blood-filled blister due to damage of underlying soft tissue from pressure and/or shear. (  Active)  05/03/19 0045  Location: Heel  Location Orientation: Right  Staging: Deep Tissue Injury - Purple or maroon localized area of discolored intact skin or blood-filled blister due to damage of underlying soft tissue from pressure and/or shear.  Wound Description (Comments):   Present on Admission: Yes     Pressure Injury 05/03/19 Sacrum Deep Tissue Injury - Purple or maroon localized area of discolored intact skin or blood-filled blister due to damage of underlying soft tissue from pressure and/or shear. (Active)  05/03/19 0045  Location: Sacrum  Location Orientation:   Staging: Deep Tissue Injury - Purple or maroon localized area of discolored intact skin or blood-filled blister due to damage of underlying soft tissue from pressure and/or shear.  Wound Description (Comments):   Present on Admission:      Pressure Injury 05/03/19 Buttocks Right;Circumferential Stage I -  Intact skin with non-blanchable redness of a localized area usually over a bony prominence. From brief elastic (Active)  05/03/19 0045  Location: Buttocks  Location Orientation: Right;Circumferential  Staging: Stage I -  Intact skin with non-blanchable redness of a localized area usually over a bony prominence.  Wound Description (Comments): From brief elastic  Present on Admission: Yes     Consultants:     Procedures:     Antimicrobials:   Ceftriaxone     Subjective: Patient continue to be very weak and deconditioned, not able to eat and clinically in distress, not able to communicate.   Objective: Vitals:   05/16/19 1600 05/16/19 2000 05/17/19 0400 05/17/19 0752  BP: (!) 113/49 (!) 109/50 (!) 127/56 (!) 118/49  Pulse: 93 89 88 87  Resp: (!) 21 20 20 15   Temp:  98.9 F (37.2 C) 99 F (37.2 C) 98.5 F (36.9 C)  TempSrc:  Axillary Axillary Axillary  SpO2: 100% 100% 100% 100%  Weight:       Height:        Intake/Output Summary (Last 24 hours) at 05/17/2019 0819 Last data filed at 05/17/2019 0733 Gross per 24 hour  Intake 1144.9 ml  Output 1050 ml  Net 94.9 ml   Filed Weights   05/12/19 0500 05/13/19 0510 05/16/19 0508  Weight: 62.7 kg 61.4 kg 59.4 kg    Examination:   General: deconditioned and ill looking appearing. Positive distress.  Neurology: opens eyes to voice, not following commands, looks in pain.  E ENT: positive pallor.  Cardiovascular: No JVD. S1-S2 present, rhythmic, no gallops, rubs, or murmurs. No lower extremity edema. Pulmonary:  Positive breath sounds bilaterally. Gastrointestinal. Abdomen with no organomegaly, non tender, no rebound or guarding Skin. No rashes Musculoskeletal: no joint deformities     Data Reviewed: I have personally reviewed following labs and imaging studies  CBC: Recent Labs  Lab 05/11/19 0410 05/12/19 0455 05/15/19 1215  WBC 17.8* 15.7* 10.1  NEUTROABS 15.5* 13.4* 8.0*  HGB 8.6* 8.4* 9.1*  HCT 26.8* 26.1* 27.3*  MCV 104.7* 103.6* 102.6*  PLT 282 286 160   Basic Metabolic Panel: Recent Labs  Lab 05/11/19 0410 05/12/19 0455 05/13/19 0035 05/14/19 0127 05/15/19 1215  NA 137 137 136 134* 136  K 4.7 5.0 5.3* 4.8 4.2  CL 109 107 104 104 107  CO2 21* 21* 22 20* 19*  GLUCOSE 169* 98 59* 116* 107*  BUN 44* 42* 47* 51* 35*  CREATININE 0.61 0.53 0.56 0.68 0.70  CALCIUM 7.6* 7.8* 8.1* 8.1* 8.5*  MG 1.8 1.6*  --   --   --   PHOS 1.8*  2.5  --   --   --    GFR: Estimated Creatinine Clearance: 49.2 mL/min (by C-G formula based on SCr of 0.7 mg/dL). Liver Function Tests: Recent Labs  Lab 05/11/19 0410 05/12/19 0455 05/13/19 0035 05/14/19 0127  AST 32 31 35 46*  ALT 29 29 36 51*  ALKPHOS 67 62 76 84  BILITOT 0.8 0.7 0.5 0.4  PROT 5.5* 5.4* 5.9* 5.6*  ALBUMIN 2.5* 2.4* 2.5* 2.3*   No results for input(s): LIPASE, AMYLASE in the last 168 hours. No results for input(s): AMMONIA in the last 168 hours.  Coagulation Profile: No results for input(s): INR, PROTIME in the last 168 hours. Cardiac Enzymes: No results for input(s): CKTOTAL, CKMB, CKMBINDEX, TROPONINI in the last 168 hours. BNP (last 3 results) No results for input(s): PROBNP in the last 8760 hours. HbA1C: No results for input(s): HGBA1C in the last 72 hours. CBG: Recent Labs  Lab 05/16/19 1121 05/16/19 1606 05/16/19 2004 05/17/19 0413 05/17/19 0751  GLUCAP 115* 105* 95 87 96   Lipid Profile: No results for input(s): CHOL, HDL, LDLCALC, TRIG, CHOLHDL, LDLDIRECT in the last 72 hours. Thyroid Function Tests: No results for input(s): TSH, T4TOTAL, FREET4, T3FREE, THYROIDAB in the last 72 hours. Anemia Panel: No results for input(s): VITAMINB12, FOLATE, FERRITIN, TIBC, IRON, RETICCTPCT in the last 72 hours.    Radiology Studies: I have reviewed all of the imaging during this hospital visit personally     Scheduled Meds: . enoxaparin (LOVENOX) injection  40 mg Subcutaneous Q24H  . erythromycin   Both Eyes Q6H  . Ipratropium-Albuterol  1 puff Inhalation QID  . mouth rinse  15 mL Mouth Rinse BID  . vitamin C  500 mg Oral Daily  . zinc sulfate  220 mg Oral Daily   Continuous Infusions: . cefTRIAXone (ROCEPHIN)  IV Stopped (05/16/19 1337)  . dextrose 5% lactated ringers 50 mL/hr at 05/17/19 0733     LOS: 14 days        Mauricio Annett Gulaaniel Arrien, MD

## 2019-05-17 NOTE — Plan of Care (Signed)
  Problem: Coping: Goal: Psychosocial and spiritual needs will be supported Outcome: Progressing   Problem: Respiratory: Goal: Will maintain a patent airway Outcome: Progressing Goal: Complications related to the disease process, condition or treatment will be avoided or minimized Outcome: Progressing   

## 2019-05-18 DIAGNOSIS — Z515 Encounter for palliative care: Secondary | ICD-10-CM

## 2019-05-18 DIAGNOSIS — R627 Adult failure to thrive: Secondary | ICD-10-CM

## 2019-05-18 DIAGNOSIS — Z7189 Other specified counseling: Secondary | ICD-10-CM

## 2019-05-18 LAB — GLUCOSE, CAPILLARY
Glucose-Capillary: 86 mg/dL (ref 70–99)
Glucose-Capillary: 95 mg/dL (ref 70–99)
Glucose-Capillary: 89 mg/dL (ref 70–99)
Glucose-Capillary: 92 mg/dL (ref 70–99)

## 2019-05-18 NOTE — Progress Notes (Signed)
Spoke with pt's son about visitation. Son want to visit in order to decide about comfort care. He was also notified about visitation rules and that he will be allow to visit once for a short amount time (71min). He will notify staff tomorrow prior to coming to visit. Will continue to monitor

## 2019-05-18 NOTE — Consult Note (Signed)
Consultation Note Date: 05/18/2019   Patient Name: Tiffany Turner  DOB: 01-Dec-1940  MRN: [id number]  Age / Sex: 78 y.o., female  PCP: Patient, No Pcp Per Referring Physician: Drema Dallas, MD  Reason for Consultation: Establishing goals of care  HPI/Patient Profile: 78 y.o. female  with past medical history of advanced dementia, HTN, CAD admitted on 05/03/2019 with worsening confusion, UTI sepsis, acute renal failure, +COVID pneumonia.   Clinical Assessment and Goals of Care: I spent time reviewing electronic records and past palliative notes. I spoke with RN who reports continued severe aspiration risk. She is requiring frequent morphine and ativan d/t concern with restlessness with signs of pain and anxiety.   I spoke with son, [name], via telephone. We discussed his mother's decline. He is very concerned that she declined so quickly and questions that she should have been sent to the hospital sooner and that we may not be in this poor condition and prognosis.   We discussed GOC and how to best care for her. He agrees with comfort medications and that she should not suffer and accepts side effects. He continues to try and hold out hope for improvement and would like to continue current interventions. I explained that IVF may need to be stopped soon given side effects of swelling, pulmonary edema, increased secretions, etc. and he understands but wishes to continue for now. He also hopes that we will continue to offer and assess for appropriateness of feeding. I did explain that she does not even exhibit interest in food/drink per SLP.   I offered listening and emotional support. Towards the end of our conversation he does ask me "she isn't going to make it, is she?" and I responded that I fear she will not and prognosis is very poor. He is hopeful that visit can be arranged to see his mother - I have passed  this on to nursing for assistance.   Primary Decision Maker NEXT OF KIN son [name]    SUMMARY OF RECOMMENDATIONS   - Comfort is priority - Continue current level of care  Code Status/Advance Care Planning:  DNR   Symptom Management:   Comfort meds in place as needed.   Palliative Prophylaxis:   Aspiration, Bowel Regimen, Delirium Protocol, Frequent Pain Assessment and Turn Reposition  Psycho-social/Spiritual:   Desire for further Chaplaincy support:yes  Additional Recommendations: Grief/Bereavement Support  Prognosis:   Hours - Days  Discharge Planning: Likely hospital death.      Primary Diagnoses: Present on Admission: . Acute lower UTI . Dehydration with hypernatremia . Acute metabolic encephalopathy . Acute renal failure (HCC) . 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 Hypertension . Sepsis secondary to UTI (HCC) . COVID-19 virus infection . Hypernatremia . Hyperchloremia . Bacterial conjunctivitis . Alzheimer's dementia (HCC) . Protein-calorie malnutrition, moderate (HCC) . Metabolic acidosis   I have reviewed the medical record, interviewed the patient and family, and examined the patient. The following aspects are pertinent.  Past Medical History:  Diagnosis  Date  . Coronary artery disease    stents placed  . Dementia (HCC)   . Hip dislocation, right (HCC)    hx of dislocation  . Hyperlipemia   . Hypertension    Social History   Socioeconomic History  . Marital status: Single    Spouse name: Not on file  . Number of children: Not on file  . Years of education: Not on file  . Highest education level: Not on file  Occupational History  . Not on file  Social Needs  . Financial resource strain: Patient refused  . Food insecurity    Worry: Patient refused    Inability: Patient refused  . Transportation needs    Medical: Patient refused    Non-medical: Patient refused  Tobacco Use  .  Smoking status: Current Every Day Smoker    Packs/day: 0.50    Types: Cigarettes  . Smokeless tobacco: Never Used  Substance and Sexual Activity  . Alcohol use: No  . Drug use: No  . Sexual activity: Not on file  Lifestyle  . Physical activity    Days per week: Patient refused    Minutes per session: Patient refused  . Stress: Patient refused  Relationships  . Social Musicianconnections    Talks on phone: Patient refused    Gets together: Patient refused    Attends religious service: Patient refused    Active member of club or organization: Patient refused    Attends meetings of clubs or organizations: Patient refused    Relationship status: Patient refused  Other Topics Concern  . Not on file  Social History Narrative   Pt incoherent at this time and unable to answer questions.   Family History  Problem Relation Age of Onset  . CAD Father    Scheduled Meds: . erythromycin   Both Eyes Q6H  . mouth rinse  15 mL Mouth Rinse BID   Continuous Infusions: . cefTRIAXone (ROCEPHIN)  IV Stopped (05/17/19 1345)  . dextrose 5% lactated ringers 50 mL/hr at 05/18/19 0739   PRN Meds:.acetaminophen **OR** acetaminophen, antiseptic oral rinse, chlorpheniramine-HYDROcodone, glycopyrrolate **OR** glycopyrrolate **OR** glycopyrrolate, LORazepam **OR** LORazepam **OR** LORazepam, metoprolol tartrate, morphine injection, ondansetron **OR** ondansetron (ZOFRAN) IV, polyvinyl alcohol Allergies  Allergen Reactions  . Penicillins Other (See Comments)    Has patient had a PCN reaction causing immediate rash, facial/tongue/throat swelling, SOB or lightheadedness with hypotension: Unknown Has patient had a PCN reaction causing severe rash involving mucus membranes or skin necrosis: Unknown Has patient had a PCN reaction that required hospitalization: Unknown Has patient had a PCN reaction occurring within the last 10 years: Unknown If all of the above answers are "NO", then may proceed with Cephalosporin  use. Tolerated ceftriaxone  . Sulfa Antibiotics Other (See Comments)    unknown   Review of Systems  Physical Exam  Vital Signs: BP 118/67   Pulse 87   Temp 98 F (36.7 C) (Oral)   Resp 18   Ht 5\' 4"  (1.626 m)   Wt 59 kg   SpO2 95%   BMI 22.33 kg/m  Pain Scale: Faces   Pain Score: Asleep   SpO2: SpO2: 95 % O2 Device:SpO2: 95 % O2 Flow Rate: .O2 Flow Rate (L/min): 4 L/min  IO: Intake/output summary:   Intake/Output Summary (Last 24 hours) at 05/18/2019 1104 Last data filed at 05/18/2019 0300 Gross per 24 hour  Intake 433.29 ml  Output 100 ml  Net 333.29 ml    LBM: Last BM Date: 05/14/19  Baseline Weight: Weight: 57.8 kg Most recent weight: Weight: 59 kg     Palliative Assessment/Data:     Time In: 1200 Time Out: 1250 Time Total: 50 min Greater than 50%  of this time was spent counseling and coordinating care related to the above assessment and plan.  Signed by: Vinie Sill, NP Palliative Medicine Team Pager # [phone] (M-F 8a-5p) Team Phone # [phone] (Nights/Weekends)   The above conversation was completed via telephone due to the visitor restrictions during the COVID-19 pandemic. Thorough chart review and discussion with necessary members of the care team was completed as part of assessment. All issues were discussed and addressed but no physical exam was performed.

## 2019-05-18 NOTE — Progress Notes (Signed)
All wound dressing has been changed. Sacrum and heels with new meplex foam in place will continue to monitor.

## 2019-05-18 NOTE — Progress Notes (Signed)
Provider was paged regarding pt's bladder scan no answer so far, incoming RN and charge nurses aware. I spoke with pt's son again when he was taking with physical therapist and pt on facetime. Visitation will be done tomorrow incoming charge is going to call him to set it up.

## 2019-05-18 NOTE — Progress Notes (Addendum)
PROGRESS NOTE    [name]  RWE:[id number] DOB: 20-Aug-1940 DOA: 05/03/2019 PCP: Patient, No Pcp Per   Brief Narrative:  Tiffany Turner is a 78 y.o. WF PMHx  advanced dementia, htn, CAD   presents with confusion over the last several days.  Pt with h/o freq utis.  Cannot provide any history due to her dementia.  All history obtained from records.  Found to be septic from uti with aki cr over 5 and covid positive.  Given vanc and azactam.    Subjective: 11/11 Per EMR notes from 11/10 patient has been made comfort care.  No escalation of care however care to continue at current level.  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 ([place])   Acute renal failure ([place])   Acute lower UTI   Dehydration with hypernatremia   Acute metabolic encephalopathy   COVID-19 virus infection   Pressure injury of skin   Hypernatremia   Hyperchloremia   Bacterial conjunctivitis   Alzheimer's dementia (HCC)   Protein-calorie malnutrition, moderate (HCC)   Metabolic acidosis  Covid 19 virus infection -Negative respiratory symptoms  -Placed on continuous O2 monitoring -10/29 PCXR bibasilar opacification see results below -Decadron 6 mg daily -Remdesivir per pharmacy protocol -Titrate O2 to maintain SPO2> 88% -Combivent QID -Flutter valve -Vitamins per Covid protocol COVID-19 Labs  No results for input(s): DDIMER, FERRITIN, LDH, CRP in the last 72 hours.  -10/29 SARS coronavirus positive -11/11 Per EMR note on 11/10 patient was made comfort care.  Care at to remain at current level i.e. no escalation of care and palliative care was consulted.  Will await their recommendations.  Proteus Mirabilis UTI -On admission patient does not meet criteria for sepsis. -Treat for 5-day course.  Acute renal failure?  -Patient's last creatinine 05/01/2018   0.80, unsure of how much of this change is acute given patient's clinical picture, suspect chronic component. -Per admission note family does not desire HD. Recent Labs  Lab 05/12/19 0455 05/13/19 0035 05/14/19 0127 05/15/19 1215  CREATININE 0.53 0.56 0.68 0.70  -Avoid nephrotoxic medication -Creatinine improving with hydration.  Severe metabolic derangement -Apparent patient has not been eating or drinking appropriately for at least weeks.  Will attempt to correct underlying metabolic abnormalities.  Metabolic acidosis -1 amp of Na-bicarb  Hypernatremia/hyperchloremia -Secondary to severe dehydration -10/31 D5W-0.9% saline 72ml/hr.  Beginning to become hyponatremic -11/2 increase free water 235ml TID   Essential HTN -Stable off BP medication  Alzheimer's dementia -Unsure of baseline currently.  Increase response to painful stimuli after 24 hours of hydration.    Acute metabolic encephalopathy -Multifactorial severe metabolic derangement secondary to severe dehydration and malnourishment, infection -Correct underlying causes -11/3 CT W0 head; negative for acute findings see results below    Bacterial Conjunctivitis -Erythromycin ointment x7 days  Hypokalemia -Potassium goal> 4  Hypomagnesmia -Magnesium goal> 2 -Magnesium IV 2 g  Leukocytosis -Patient afebrile, negative left shift, negative bands most likely secondary to steroids   Protein calorie malnutrition moderate  -Have been attempting to contact son since admission unsuccessfully will insert NG tube, medical necessity in order to provide nutrition for patient. -10/31-second time consulted nutrition for enteral feeds  -10/31 Albumin 25 g -11/3 Albumin 25 g; will help with edema  Pain management -11/3 order to nursing staff; they have been quick to  want to use narcotics in this frail older woman so the following orders have been placed.   Please do not use  benzodiazepines or narcotics on this patient unless other means have been exhausted.  Positioning of patient, heating/cooling packs to any areas of discomfort, Tylenol, or Toradol.  Goals of care -11/3 attempting to engage Adult Protective Services and to case secondary to Apparent patient has not been eating or drinking appropriately for at least weeks.  11/3 spoke with NCM Crystal, who will consult with LCSW Sophire and will ensure Adult Protective Services are informed of this case.  I informed son that had called casein today and was waiting to hear back from APS. -11/11 Per EMR note on 11/10 patient was made comfort care.  Care at to remain at current level i.e. no escalation of care and palliative care was consulted.  Will await their recommendations.    DVT prophylaxis: Lovenox Code Status: DNR Family Communication: 11/11Spoke with Tiffany Turner (son) counseled him on plan of care and answered all questions.  Tiffany Turner understands that mother will not survive this hospitalization, is okay with comfort care would like to visit his mother in the morning.  I have instructed him to call to the floor and speak with the charge nurse in order to arrange the particulars of visiting mother in the morning. Disposition Plan: TBD   Consultants:    Procedures/Significant Events:  PCXR 10/29;-Stable bibasilar opacities are noted concerning for pneumonia or atelectasis.  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 -No acute intracranial abnormality. Specifically, no hemorrhage, hydrocephalus, mass lesion, acute infarction, or significant intracranial injury.  -atrophy and chronic small vessel disease changes.    I have personally reviewed and interpreted all radiology studies and my findings are as above.  VENTILATOR SETTINGS:    Cultures 10/26 urine positive PROTEUS MIRABILIS  10/26 blood LEFT hand NGTD 10/26 blood RIGHT wrist NGTD 10/29 SARS  coronavirus positive    Antimicrobials: Anti-infectives (From admission, onward)   Start     Stop   05/04/19 1700  cefTRIAXone (ROCEPHIN) 1 g in sodium chloride 0.9 % 100 mL IVPB     05/08/19 1832   05/04/19 1600  remdesivir 100 mg in sodium chloride 0.9 % 250 mL IVPB     05/07/19 1602   05/04/19 1300  fosfomycin (MONUROL) packet 3 g  Status:  Discontinued     05/04/19 1605   05/04/19 1200  cefTRIAXone (ROCEPHIN) 1 g in sodium chloride 0.9 % 100 mL IVPB  Status:  Discontinued     05/04/19 1219   05/03/19 1600  ciprofloxacin (CIPRO) IVPB 200 mg  Status:  Discontinued     05/04/19 1148   05/03/19 1600  remdesivir 200 mg in sodium chloride 0.9 % 250 mL IVPB     05/03/19 1730   05/03/19 0600  aztreonam (AZACTAM) 0.5 g in dextrose 5 % 50 mL IVPB  Status:  Discontinued     05/03/19 0815   05/03/19 0048  vancomycin variable dose per unstable renal function (pharmacist dosing)  Status:  Discontinued     05/03/19 0815       Devices    LINES / TUBES:      Continuous Infusions: . cefTRIAXone (ROCEPHIN)  IV Stopped (05/17/19 1345)  . dextrose 5% lactated ringers 50 mL/hr at 05/18/19 0739     Objective: Vitals:   05/17/19 2000 05/18/19 0400 05/18/19 0500 05/18/19 0732  BP: (!) 110/55 (!) 124/49  (!) 121/59  Pulse: 87 77  (!) 102  Resp: 19 20  18   Temp: 98.4 F (36.9 C) 98.5 F (36.9 C)  98 F (36.7 C)  TempSrc: Axillary Axillary  Oral  SpO2: 99% 96%  96%  Weight:   59 kg   Height:        Intake/Output Summary (Last 24 hours) at 05/18/2019 13/05/2019 Last data filed at 05/18/2019 0300 Gross per 24 hour  Intake 433.29 ml  Output 250 ml  Net 183.29 ml   Filed Weights   05/13/19 0510 05/16/19 0508 05/18/19 0500  Weight: 61.4 kg 59.4 kg 59 kg   Physical Exam:  General: Eyes open, follow no directions answers no questions, negative acute respiratory distress Eyes: negative scleral hemorrhage, negative anisocoria, negative icterus ENT: Negative Runny nose, negative  gingival bleeding, Neck:  Negative scars, masses, torticollis, lymphadenopathy, JVD Lungs: Clear to auscultation bilaterally without wheezes or crackles Cardiovascular: Regular rate and rhythm without murmur gallop or rub normal S1 and S2 Abdomen: negative abdominal pain, nondistended, positive soft, bowel sounds, no rebound, no ascites, no appreciable mass Extremities: No significant cyanosis, clubbing, or positive (+) 1-2 edema bilateral extremities Skin: Negative rashes, lesions, ulcers Psychiatric: Not able to assess secondary to altered mental status Central nervous system: Follows no commands, eyes open      .     Data Reviewed: Care during the described time interval was provided by me .  I have reviewed this patient's available data, including medical history, events of note, physical examination, and all test results as part of my evaluation.   CBC: Recent Labs  Lab 05/12/19 0455 05/15/19 1215  WBC 15.7* 10.1  NEUTROABS 13.4* 8.0*  HGB 8.4* 9.1*  HCT 26.1* 27.3*  MCV 103.6* 102.6*  PLT 286 236   Basic Metabolic Panel: Recent Labs  Lab 05/12/19 0455 05/13/19 0035 05/14/19 0127 05/15/19 1215  NA 137 136 134* 136  K 5.0 5.3* 4.8 4.2  CL 107 104 104 107  CO2 21* 22 20* 19*  GLUCOSE 98 59* 116* 107*  BUN 42* 47* 51* 35*  CREATININE 0.53 0.56 0.68 0.70  CALCIUM 7.8* 8.1* 8.1* 8.5*  MG 1.6*  --   --   --   PHOS 2.5  --   --   --    GFR: Estimated Creatinine Clearance: 49.2 mL/min (by C-G formula based on SCr of 0.7 mg/dL). Liver Function Tests: Recent Labs  Lab 05/12/19 0455 05/13/19 0035 05/14/19 0127  AST 31 35 46*  ALT 29 36 51*  ALKPHOS 62 76 84  BILITOT 0.7 0.5 0.4  PROT 5.4* 5.9* 5.6*  ALBUMIN 2.4* 2.5* 2.3*   No results for input(s): LIPASE, AMYLASE in the last 168 hours. No results for input(s): AMMONIA in the last 168 hours. Coagulation Profile: No results for input(s): INR, PROTIME in the last 168 hours. Cardiac Enzymes: No results for  input(s): CKTOTAL, CKMB, CKMBINDEX, TROPONINI in the last 168 hours. BNP (last 3 results) No results for input(s): PROBNP in the last 8760 hours. HbA1C: No results for input(s): HGBA1C in the last 72 hours. CBG: Recent Labs  Lab 05/17/19 0751 05/17/19 1148 05/17/19 1627 05/18/19 0003 05/18/19 0731  GLUCAP 96 96 97 86 95   Lipid Profile: No results for input(s): CHOL, HDL, LDLCALC, TRIG, CHOLHDL, LDLDIRECT in the last 72 hours. Thyroid Function Tests: No results for input(s): TSH, T4TOTAL, FREET4, T3FREE, THYROIDAB in the last 72 hours. Anemia Panel: No results for input(s): VITAMINB12, FOLATE, FERRITIN, TIBC, IRON, RETICCTPCT  in the last 72 hours. Urine analysis:    Component Value Date/Time   COLORURINE AMBER (A) 05/02/2019 0826   APPEARANCEUR TURBID (A) 05/02/2019 0826   APPEARANCEUR Turbid 07/29/2014 1548   LABSPEC 1.020 05/02/2019 0826   LABSPEC 1.018 07/29/2014 1548   PHURINE 7.0 05/02/2019 0826   GLUCOSEU NEGATIVE 05/02/2019 0826   GLUCOSEU Negative 07/29/2014 1548   HGBUR LARGE (A) 05/02/2019 0826   BILIRUBINUR NEGATIVE 05/02/2019 0826   KETONESUR NEGATIVE 05/02/2019 0826   PROTEINUR >=300 (A) 05/02/2019 0826   NITRITE NEGATIVE 05/02/2019 0826   LEUKOCYTESUR MODERATE (A) 05/02/2019 0826   LEUKOCYTESUR 3+ 07/29/2014 1548   Sepsis Labs: @LABRCNTIP (procalcitonin:4,lacticidven:4)  ) No results found for this or any previous visit (from the past 240 hour(s)).       Radiology Studies: No results found.      Scheduled Meds: . erythromycin   Both Eyes Q6H  . mouth rinse  15 mL Mouth Rinse BID   Continuous Infusions: . cefTRIAXone (ROCEPHIN)  IV Stopped (05/17/19 1345)  . dextrose 5% lactated ringers 50 mL/hr at 05/18/19 0739     LOS: 15 days   The patient is critically ill with multiple organ systems failure and requires high complexity decision making for assessment and support, frequent evaluation and titration of therapies, application of advanced  monitoring technologies and extensive interpretation of multiple databases. Critical Care Time devoted to patient care services described in this note  Time spent: 40 minutes     , Roselind MessierURTIS J, MD [facility] Pager [phone]  If 7PM-7AM, please contact night-coverage [email or web address] Password [email or web address] 05/18/2019, 8:28 AM

## 2019-05-18 NOTE — Progress Notes (Signed)
SLP Cancellation Note  Patient Details Name: Tiffany Turner MRN: [id number] DOB: 1941-06-12   Cancelled treatment:       Reason Eval/Treat Not Completed: Other (comment). Pt transitioning to full comfort measures. Will sign off.   Herbie Baltimore, MA CCC-SLP  Acute Rehabilitation Services Pager [phone] Office [phone]  Lynann Beaver 05/18/2019, 12:21 PM

## 2019-05-19 MED ORDER — SODIUM CHLORIDE 0.9% FLUSH: 10.0000 mL | INTRAVENOUS | Status: DC | PRN

## 2019-05-19 NOTE — Progress Notes (Signed)
Spoke with patient's son and updated him on patient's status.  Son stated he was told by someone that with his mom being on comfort care he could visit as many times as he would like.  I told him I had never heard that and that we allow one visit once the patient became comfort care.  Son seemed okay with that.

## 2019-05-19 NOTE — Progress Notes (Signed)
RN spoke with patient's son, Liset Mcmonigle. Patient's son updated at this time, all questions and concerns answered. Gerald Stabs states he is waiting out the current storm before he drives to hospital to see patient. MD aware that patient's son is requesting update via phone.

## 2019-05-19 NOTE — Progress Notes (Addendum)
PROGRESS NOTE    Tiffany Turner  ZOX:[id number] DOB: 05/04/1941 DOA: 05/03/2019 PCP: Patient, No Pcp Per   Brief Narrative:  Tiffany Turner is a 78 y.o. WF PMHx  advanced dementia, htn, CAD   presents with confusion over the last several days.  Pt with h/o freq utis.  Cannot provide any history due to her dementia.  All history obtained from records.  Found to be septic from uti with aki cr over 5 and covid positive.  Given vanc and azactam.    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)   Acute renal failure (HCC)   Acute lower UTI   Dehydration with hypernatremia   Acute metabolic encephalopathy   COVID-19 virus infection   Pressure injury of skin   Hypernatremia   Hyperchloremia   Bacterial conjunctivitis   Alzheimer's dementia (HCC)   Protein-calorie malnutrition, moderate (HCC)   Metabolic acidosis   Failure to thrive in adult  Covid 19 virus infection -Negative respiratory symptoms  -Placed on continuous O2 monitoring -10/29 PCXR bibasilar opacification see results below -Decadron 6 mg daily -Remdesivir per pharmacy protocol -Titrate O2 to maintain SPO2> 88% -Combivent QID -Flutter valve -Vitamins per Covid protocol COVID-19 Labs  No results for input(s): DDIMER, FERRITIN, LDH, CRP in the last 72 hours.  -10/29 SARS coronavirus positive -11/11 Per EMR note on 11/10 patient was made comfort care.  Care at to remain at current level i.e. no escalation of care and palliative care was consulted.  Will await their recommendations.  Proteus Mirabilis UTI -On admission patient does not meet criteria for sepsis. -Treat for 5-day course.  Acute renal failure? -Patient's last creatinine 05/01/2018   0.80, unsure of how much of this  change is acute given patient's clinical picture, suspect chronic component. -Per admission note family does not desire HD. Recent Labs  Lab 05/13/19 0035 05/14/19 0127 05/15/19 1215  CREATININE 0.56 0.68 0.70  -Avoid nephrotoxic medication -Creatinine improving with hydration.  Severe metabolic derangement -Apparent patient has not been eating or drinking appropriately for at least weeks.  Will attempt to correct underlying metabolic abnormalities.  Metabolic acidosis -1 amp of Na-bicarb  Hypernatremia/hyperchloremia -Secondary to severe dehydration -10/31 D5W-0.9% saline 6775ml/hr.  Beginning to become hyponatremic -11/2 increase free water 200ml TID   Essential HTN -Stable off BP medication  Alzheimer's dementia -Unsure of baseline currently.  Increase response to painful stimuli after 24 hours of hydration.    Acute metabolic encephalopathy -Multifactorial severe metabolic derangement secondary to severe dehydration and malnourishment, infection -Correct underlying causes -11/3 CT W0 head; negative for acute findings see results below    Bacterial Conjunctivitis -Erythromycin ointment x7 days  Hypokalemia -Potassium goal> 4  Hypomagnesmia -Magnesium goal> 2 -Magnesium IV 2 g  Leukocytosis -Patient afebrile, negative left shift, negative bands most likely secondary to steroids   Protein calorie malnutrition moderate  -Have been attempting to contact son since admission unsuccessfully will insert NG tube, medical necessity in order to provide nutrition for patient. -10/31-second time consulted nutrition for enteral feeds  -10/31 Albumin 25 g -11/3 Albumin 25 g; will help with edema  Pain management -11/3 order to nursing staff; they have been quick to want to use narcotics in this frail older woman so the following orders have been placed.  Please do not use benzodiazepines or narcotics on this patient unless other means have been exhausted.  Positioning of  patient, heating/cooling packs to any areas of discomfort, Tylenol, or Toradol.  Goals of care -11/3 attempting to engage Adult Protective Services and to case secondary to Apparent patient has not been eating or drinking appropriately for at least weeks.  11/3 spoke with NCM Crystal, who will consult with LCSW Sophire and will ensure Adult Protective Services are informed of this case.  I informed son that had called casein today and was waiting to hear back from APS. -11/11 Per EMR note on 11/10 patient was made comfort care.  Care at to remain at current level i.e. no escalation of care and palliative care was consulted.  Will await their recommendations.    DVT prophylaxis: Lovenox Code Status: DNR Family Communication: 11/12 Spoke with Gerald Stabs (son) counseled him on plan of care and answered all questions.  Gerald Stabs visited his mother today.  He also informed me that he would be seeking out a lawyer to open up a civil suit against [facility]  Disposition Plan: Comfort care   Consultants:    Procedures/Significant Events:  PCXR 10/29;-Stable bibasilar opacities are noted concerning for pneumonia or atelectasis.  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 -No acute intracranial abnormality. Specifically, no hemorrhage, hydrocephalus, mass lesion, acute infarction, or significant intracranial injury.  -atrophy and chronic small vessel disease changes.    I have personally reviewed and interpreted all radiology studies and my findings are as above.  VENTILATOR SETTINGS:    Cultures 10/26 urine positive PROTEUS MIRABILIS  10/26 blood LEFT hand NGTD 10/26 blood RIGHT wrist NGTD 10/29 SARS coronavirus positive    Antimicrobials: Anti-infectives (From admission, onward)   Start     Stop   05/15/19 1400  cefTRIAXone (ROCEPHIN) 1 g in sodium chloride 0.9 % 100 mL IVPB     05/19/19 2359   05/04/19 1700  cefTRIAXone (ROCEPHIN) 1  g in sodium chloride 0.9 % 100 mL IVPB     05/08/19 1832   05/04/19 1600  remdesivir 100 mg in sodium chloride 0.9 % 250 mL IVPB     05/07/19 1602   05/04/19 1300  fosfomycin (MONUROL) packet 3 g  Status:  Discontinued     05/04/19 1605   05/04/19 1200  cefTRIAXone (ROCEPHIN) 1 g in sodium chloride 0.9 % 100 mL IVPB  Status:  Discontinued     05/04/19 1219   05/03/19 1600  ciprofloxacin (CIPRO) IVPB 200 mg  Status:  Discontinued     05/04/19 1148   05/03/19 1600  remdesivir 200 mg in sodium chloride 0.9 % 250 mL IVPB     05/03/19 1730   05/03/19 0600  aztreonam (AZACTAM) 0.5 g in dextrose 5 % 50 mL IVPB  Status:  Discontinued     05/03/19 0815   05/03/19 0048  vancomycin variable dose per unstable renal function (pharmacist dosing)  Status:  Discontinued     05/03/19 0815       Devices    LINES / TUBES:      Continuous Infusions:  cefTRIAXone (ROCEPHIN)  IV Stopped (05/18/19 1546)   dextrose 5% lactated ringers 50 mL/hr at 05/19/19 0430     Objective: Vitals:   05/18/19 1505 05/18/19 2000 05/19/19 0300 05/19/19 0430  BP: 125/62 92/76    Pulse: 84 88    Resp: 19 18 20    Temp: 98.1 F (36.7 C) 98  F (36.7 C)    TempSrc: Axillary Axillary    SpO2: 97% 96%    Weight:    59.2 kg  Height:        Intake/Output Summary (Last 24 hours) at 05/19/2019 1206 Last data filed at 05/19/2019 1200 Gross per 24 hour  Intake 1519.03 ml  Output 500 ml  Net 1019.03 ml   Filed Weights   05/16/19 0508 05/18/19 0500 05/19/19 0430  Weight: 59.4 kg 59 kg 59.2 kg   Physical Exam:  General: Barely opens eyes to significant stimulation, no acute respiratory distress Eyes: negative scleral hemorrhage, negative anisocoria, negative icterus ENT: Negative Runny nose, negative gingival bleeding, Neck:  Negative scars, masses, torticollis, lymphadenopathy, JVD Lungs: Clear to auscultation bilaterally without wheezes or crackles Cardiovascular: Regular rate and rhythm without murmur  gallop or rub normal S1 and S2 Abdomen: negative abdominal pain, nondistended, positive soft, bowel sounds, no rebound, no ascites, no appreciable mass Extremities: No significant cyanosis, clubbing, or edema bilateral lower extremities Skin: Negative rashes, lesions, ulcers Psychiatric: Unable to assess Central nervous system: Unable to assess      .     Data Reviewed: Care during the described time interval was provided by me .  I have reviewed this patient's available data, including medical history, events of note, physical examination, and all test results as part of my evaluation.   CBC: Recent Labs  Lab 05/15/19 1215  WBC 10.1  NEUTROABS 8.0*  HGB 9.1*  HCT 27.3*  MCV 102.6*  PLT 236   Basic Metabolic Panel: Recent Labs  Lab 05/13/19 0035 05/14/19 0127 05/15/19 1215  NA 136 134* 136  K 5.3* 4.8 4.2  CL 104 104 107  CO2 22 20* 19*  GLUCOSE 59* 116* 107*  BUN 47* 51* 35*  CREATININE 0.56 0.68 0.70  CALCIUM 8.1* 8.1* 8.5*   GFR: Estimated Creatinine Clearance: 49.2 mL/min (by C-G formula based on SCr of 0.7 mg/dL). Liver Function Tests: Recent Labs  Lab 05/13/19 0035 05/14/19 0127  AST 35 46*  ALT 36 51*  ALKPHOS 76 84  BILITOT 0.5 0.4  PROT 5.9* 5.6*  ALBUMIN 2.5* 2.3*   No results for input(s): LIPASE, AMYLASE in the last 168 hours. No results for input(s): AMMONIA in the last 168 hours. Coagulation Profile: No results for input(s): INR, PROTIME in the last 168 hours. Cardiac Enzymes: No results for input(s): CKTOTAL, CKMB, CKMBINDEX, TROPONINI in the last 168 hours. BNP (last 3 results) No results for input(s): PROBNP in the last 8760 hours. HbA1C: No results for input(s): HGBA1C in the last 72 hours. CBG: Recent Labs  Lab 05/17/19 1627 05/18/19 0003 05/18/19 0413 05/18/19 0731 05/18/19 1103  GLUCAP 97 86 89 95 92   Lipid Profile: No results for input(s): CHOL, HDL, LDLCALC, TRIG, CHOLHDL, LDLDIRECT in the last 72 hours. Thyroid  Function Tests: No results for input(s): TSH, T4TOTAL, FREET4, T3FREE, THYROIDAB in the last 72 hours. Anemia Panel: No results for input(s): VITAMINB12, FOLATE, FERRITIN, TIBC, IRON, RETICCTPCT in the last 72 hours. Urine analysis:    Component Value Date/Time   COLORURINE AMBER (A) 05/02/2019 0826   APPEARANCEUR TURBID (A) 05/02/2019 0826   APPEARANCEUR Turbid 07/29/2014 1548   LABSPEC 1.020 05/02/2019 0826   LABSPEC 1.018 07/29/2014 1548   PHURINE 7.0 05/02/2019 0826   GLUCOSEU NEGATIVE 05/02/2019 0826   GLUCOSEU Negative 07/29/2014 1548   HGBUR LARGE (A) 05/02/2019 0826   BILIRUBINUR NEGATIVE 05/02/2019 0826   KETONESUR NEGATIVE 05/02/2019 1610  PROTEINUR >=300 (A) 05/02/2019 0826   NITRITE NEGATIVE 05/02/2019 0826   LEUKOCYTESUR MODERATE (A) 05/02/2019 0826   LEUKOCYTESUR 3+ 07/29/2014 1548   Sepsis Labs: (procalcitonin:4,lacticidven:4)  ) No results found for this or any previous visit (from the past 240 hour(s)).       Radiology Studies: No results found.      Scheduled Meds:  erythromycin   Both Eyes Q6H   mouth rinse  15 mL Mouth Rinse BID   Continuous Infusions:  cefTRIAXone (ROCEPHIN)  IV Stopped (05/18/19 1546)   dextrose 5% lactated ringers 50 mL/hr at 05/19/19 0430     LOS: 16 days   The patient is critically ill with multiple organ systems failure and requires high complexity decision making for assessment and support, frequent evaluation and titration of therapies, application of advanced monitoring technologies and extensive interpretation of multiple databases. Critical Care Time devoted to patient care services described in this note  Time spent: 40 minutes     Merced Hanners, Roselind Messier, MD [facility] Pager [phone]  If 7PM-7AM, please contact night-coverage [email or web address] Password [facility] 05/19/2019, 12:06 PM

## 2019-05-19 NOTE — Progress Notes (Signed)
Nutrition Brief Note  Chart reviewed. Pt now transitioning to comfort care. Palliative care following. Son to visit today. Per RN/SLP pt refused all po intake.  No further nutrition interventions warranted at this time.  Please re-consult as needed.   [place], [place], [place] Pager [phone] After Hours Pager

## 2019-05-20 DIAGNOSIS — R638 Other symptoms and signs concerning food and fluid intake: Secondary | ICD-10-CM

## 2019-05-20 MED ORDER — LORAZEPAM 2 MG/ML IJ SOLN
1.0000 mg | INTRAMUSCULAR | Status: DC | PRN
  Administered 2019-05-21 – 2019-05-28 (×9): 2 mg via INTRAVENOUS
  Filled 2019-05-20 (×9): qty 1

## 2019-05-20 MED ORDER — MORPHINE SULFATE (PF) 2 MG/ML IV SOLN
1.0000 mg | INTRAVENOUS | Status: DC | PRN
  Administered 2019-05-21: 1 mg via INTRAVENOUS
  Administered 2019-05-21 – 2019-05-25 (×8): 2 mg via INTRAVENOUS
  Filled 2019-05-20 (×9): qty 1

## 2019-05-20 NOTE — Progress Notes (Signed)
Palliative:  HPI: 78 y.o. female  with past medical history of advanced dementia, HTN, CAD admitted on 05/03/2019 with worsening confusion, UTI sepsis, acute renal failure, +COVID pneumonia. She is hemodynamically stable but not eating/drinking.   I spoke with RN and received update that Ms. Yowell continues with poor intake. She is crying in pain and RN giving PRN. RN reports swelling and some congestion. I RN later reports crying and complaint of pain after pain medication administered so dosage and interval increased to ensure comfort as this is priority of care.   I spoke with son, Gerald Stabs. Gerald Stabs is very friendly and very upset about his mother's decline and poor prognosis. He is frustrated by the care that she received at SNF and feels she was "neglected." He wishes to continue IVF as long as possible as he feels that she was unable to have food/drink prior to admission as he questions if she was fed as she could not feed herself. For this reason he feels that we should not stop IVF now. He agrees with continuing medication for comfort as she requires to ensure comfort. He would like to visit with his mother again if at all possible - I told him that there is a specific policy for this but I will pass on to nursing the request.   All questions/concerns addressed. Emotional support provided.   Plan: - Continue current level of care.  - Comfort focused care with continued IVF.   [place], NP Palliative Medicine Team Pager [phone] (Please see [email or web address] for schedule) Team Phone [phone]    Greater than 50%  of this time was spent counseling and coordinating care related to the above assessment and plan   The above conversation was completed via telephone due to the visitor restrictions during the COVID-19 pandemic. Thorough chart review and discussion with necessary members of the care team was completed as part of assessment. All issues were discussed and addressed but  no physical exam was performed.

## 2019-05-20 NOTE — Progress Notes (Signed)
PROGRESS NOTE    Tiffany Turner  HCW:[id number] DOB: 1941-05-06 DOA: 05/03/2019 PCP: Patient, No Pcp Per   Brief Narrative:  Tiffany Turner is a 78 y.o. WF PMHx  advanced dementia, htn, CAD   presents with confusion over the last several days.  Pt with h/o freq utis.  Cannot provide any history due to her dementia.  All history obtained from records.  Found to be septic from uti with aki cr over 5 and covid positive.  Given vanc and azactam.    Subjective: 11/13 appears comfortable.   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 ([place])   Acute renal failure (HCC)   Acute lower UTI   Dehydration with hypernatremia   Acute metabolic encephalopathy   COVID-19 virus infection   Pressure injury of skin   Hypernatremia   Hyperchloremia   Bacterial conjunctivitis   Alzheimer's dementia (HCC)   Protein-calorie malnutrition, moderate (HCC)   Metabolic acidosis   Failure to thrive in adult  Covid 19 virus infection -Negative respiratory symptoms  -Placed on continuous O2 monitoring -10/29 PCXR bibasilar opacification see results below -Decadron 6 mg daily -Remdesivir per pharmacy protocol -Titrate O2 to maintain SPO2> 88% -Combivent QID -Flutter valve -Vitamins per Covid protocol COVID-19 Labs  No results for input(s): DDIMER, FERRITIN, LDH, CRP in the last 72 hours.  -10/29 SARS coronavirus positive -11/13 patient comfort care.  Appears comfortable   Proteus Mirabilis UTI -On admission patient does not meet criteria for sepsis. -Treat for 5-day course.  Acute renal failure? -Patient's last creatinine 05/01/2018   0.80, unsure of how much of this change is acute given patient's clinical picture, suspect chronic component. -Per admission note family does not desire HD. Recent Labs  Lab 05/14/19 0127 05/15/19 1215   CREATININE 0.68 0.70  -Avoid nephrotoxic medication -Creatinine improving with hydration.  Severe metabolic derangement -Apparent patient has not been eating or drinking appropriately for at least weeks.  Will attempt to correct underlying metabolic abnormalities.  Metabolic acidosis -1 amp of Na-bicarb  Hypernatremia/hyperchloremia -Secondary to severe dehydration -10/31 D5W-0.9% saline 62ml/hr.  Beginning to become hyponatremic -11/2 increase free water 221ml TID   Essential HTN -Stable off BP medication  Alzheimer's dementia -Unsure of baseline currently.  Increase response to painful stimuli after 24 hours of hydration.    Acute metabolic encephalopathy -Multifactorial severe metabolic derangement secondary to severe dehydration and malnourishment, infection -Correct underlying causes -11/3 CT W0 contrast head; negative for acute findings see results below    Bacterial Conjunctivitis -Erythromycin ointment x7 days  Hypokalemia -Potassium goal> 4  Hypomagnesmia -Magnesium goal> 2  Leukocytosis -Patient afebrile, negative left shift, negative bands most likely secondary to steroids   Protein calorie malnutrition moderate  -Have been attempting to contact son since admission unsuccessfully will insert NG tube, medical necessity in order to provide nutrition for patient. -10/31-second time consulted nutrition for enteral feeds  -10/31 Albumin 25 g -11/3 Albumin 25 g; will help with edema  Pain management -11/3 order to nursing staff; they have been quick to want to use narcotics in this frail older woman so the following orders have been placed.   Please do not use benzodiazepines or narcotics on this patient unless other means have been exhausted.  Positioning of patient, heating/cooling packs to any areas of discomfort, Tylenol, or Toradol.  Goals of care -11/3 attempting to  engage Adult Protective Services and to case secondary to Apparent patient has not been  eating or drinking appropriately for at least weeks.  11/3 spoke with NCM Crystal, who will consult with LCSW Sophire and will ensure Adult Protective Services are informed of this case.  I informed son that had called casein today and was waiting to hear back from APS. -11/13 full comfort care     DVT prophylaxis: Lovenox Code Status: DNR Family Communication: 11/12 Spoke with [name] (son) counseled him on plan of care and answered all questions.  [name] visited his mother today.  He also informed me that he would be seeking out a lawyer to open up a civil suit against [facility]  Disposition Plan: Comfort care   Consultants:    Procedures/Significant Events:  PCXR 10/29;-Stable bibasilar opacities are noted concerning for pneumonia or atelectasis.  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 -No acute intracranial abnormality. Specifically, no hemorrhage, hydrocephalus, mass lesion, acute infarction, or significant intracranial injury.  -atrophy and chronic small vessel disease changes.    I have personally reviewed and interpreted all radiology studies and my findings are as above.  VENTILATOR SETTINGS:    Cultures 10/26 urine positive PROTEUS MIRABILIS  10/26 blood LEFT hand NGTD 10/26 blood RIGHT wrist NGTD 10/29 SARS coronavirus positive    Antimicrobials: Anti-infectives (From admission, onward)   Start     Stop   05/15/19 1400  cefTRIAXone (ROCEPHIN) 1 g in sodium chloride 0.9 % 100 mL IVPB     05/19/19 2359   05/04/19 1700  cefTRIAXone (ROCEPHIN) 1 g in sodium chloride 0.9 % 100 mL IVPB     05/08/19 1832   05/04/19 1600  remdesivir 100 mg in sodium chloride 0.9 % 250 mL IVPB     05/07/19 1602   05/04/19 1300  fosfomycin (MONUROL) packet 3 g  Status:  Discontinued     05/04/19 1605   05/04/19 1200  cefTRIAXone (ROCEPHIN) 1 g in sodium chloride 0.9 % 100 mL IVPB  Status:  Discontinued     05/04/19 1219    05/03/19 1600  ciprofloxacin (CIPRO) IVPB 200 mg  Status:  Discontinued     05/04/19 1148   05/03/19 1600  remdesivir 200 mg in sodium chloride 0.9 % 250 mL IVPB     05/03/19 1730   05/03/19 0600  aztreonam (AZACTAM) 0.5 g in dextrose 5 % 50 mL IVPB  Status:  Discontinued     05/03/19 0815   05/03/19 0048  vancomycin variable dose per unstable renal function (pharmacist dosing)  Status:  Discontinued     05/03/19 0815       Devices    LINES / TUBES:      Continuous Infusions: . dextrose 5% lactated ringers 50 mL/hr at 05/20/19 0522     Objective: Vitals:   05/19/19 0300 05/19/19 0430 05/19/19 1500 05/20/19 0413  BP:   (!) 90/40   Pulse:      Resp: 20  10   Temp:   (!) 97.2 F (36.2 C)   TempSrc:   Axillary   SpO2:      Weight:  59.2 kg  59 kg  Height:        Intake/Output Summary (Last 24 hours) at 05/20/2019 0742 Last data filed at 05/20/2019 0300 Gross per 24 hour  Intake 1048.64 ml  Output 200 ml  Net 848.64 ml   Filed Weights   05/18/19 0500 05/19/19 0430 05/20/19 0413  Weight: 59 kg 59.2 kg 59 kg   Physical Exam:  General: No acute respiratory distress, comfortable Eyes: negative scleral hemorrhage, negative anisocoria, negative icterus ENT: Negative Runny nose, negative gingival bleeding, Neck:  Negative scars, masses, torticollis, lymphadenopathy, JVD Lungs: Clear to auscultation bilaterally without wheezes or crackles Cardiovascular: Regular rate and rhythm without murmur gallop or rub normal S1 and S2  .     Data Reviewed: Care during the described time interval was provided by me .  I have reviewed this patient's available data, including medical history, events of note, physical examination, and all test results as part of my evaluation.   CBC: Recent Labs  Lab 05/15/19 1215  WBC 10.1  NEUTROABS 8.0*  HGB 9.1*  HCT 27.3*  MCV 102.6*  PLT 236   Basic Metabolic Panel: Recent Labs  Lab 05/14/19 0127 05/15/19 1215  NA 134* 136   K 4.8 4.2  CL 104 107  CO2 20* 19*  GLUCOSE 116* 107*  BUN 51* 35*  CREATININE 0.68 0.70  CALCIUM 8.1* 8.5*   GFR: Estimated Creatinine Clearance: 49.2 mL/min (by C-G formula based on SCr of 0.7 mg/dL). Liver Function Tests: Recent Labs  Lab 05/14/19 0127  AST 46*  ALT 51*  ALKPHOS 84  BILITOT 0.4  PROT 5.6*  ALBUMIN 2.3*   No results for input(s): LIPASE, AMYLASE in the last 168 hours. No results for input(s): AMMONIA in the last 168 hours. Coagulation Profile: No results for input(s): INR, PROTIME in the last 168 hours. Cardiac Enzymes: No results for input(s): CKTOTAL, CKMB, CKMBINDEX, TROPONINI in the last 168 hours. BNP (last 3 results) No results for input(s): PROBNP in the last 8760 hours. HbA1C: No results for input(s): HGBA1C in the last 72 hours. CBG: Recent Labs  Lab 05/17/19 1627 05/18/19 0003 05/18/19 0413 05/18/19 0731 05/18/19 1103  GLUCAP 97 86 89 95 92   Lipid Profile: No results for input(s): CHOL, HDL, LDLCALC, TRIG, CHOLHDL, LDLDIRECT in the last 72 hours. Thyroid Function Tests: No results for input(s): TSH, T4TOTAL, FREET4, T3FREE, THYROIDAB in the last 72 hours. Anemia Panel: No results for input(s): VITAMINB12, FOLATE, FERRITIN, TIBC, IRON, RETICCTPCT in the last 72 hours. Urine analysis:    Component Value Date/Time   COLORURINE AMBER (A) 05/02/2019 0826   APPEARANCEUR TURBID (A) 05/02/2019 0826   APPEARANCEUR Turbid 07/29/2014 1548   LABSPEC 1.020 05/02/2019 0826   LABSPEC 1.018 07/29/2014 1548   PHURINE 7.0 05/02/2019 0826   GLUCOSEU NEGATIVE 05/02/2019 0826   GLUCOSEU Negative 07/29/2014 1548   HGBUR LARGE (A) 05/02/2019 0826   BILIRUBINUR NEGATIVE 05/02/2019 0826   KETONESUR NEGATIVE 05/02/2019 0826   PROTEINUR >=300 (A) 05/02/2019 0826   NITRITE NEGATIVE 05/02/2019 0826   LEUKOCYTESUR MODERATE (A) 05/02/2019 0826   LEUKOCYTESUR 3+ 07/29/2014 1548   Sepsis Labs: @LABRCNTIP (procalcitonin:4,lacticidven:4)  ) No results  found for this or any previous visit (from the past 240 hour(s)).       Radiology Studies: No results found.      Scheduled Meds: . erythromycin   Both Eyes Q6H  . mouth rinse  15 mL Mouth Rinse BID   Continuous Infusions: . dextrose 5% lactated ringers 50 mL/hr at 05/20/19 0522     LOS: 17 days   The patient is critically ill with multiple organ systems failure and requires high complexity decision making for assessment and support, frequent evaluation and titration of therapies, application of advanced monitoring technologies and extensive interpretation of multiple databases. Critical Care Time devoted  to patient care services described in this note  Time spent: 40 minutes     Dariyah Garduno, Roselind Messier, MD [facility] Pager [phone]  If 7PM-7AM, please contact night-coverage [email or web address] Password [facility] 05/20/2019, 7:42 AM

## 2019-05-20 NOTE — Progress Notes (Signed)
Attempted to call son, straight to voicemail that has not been set up yet.

## 2019-05-21 MED ORDER — LIP MEDEX EX OINT
TOPICAL_OINTMENT | CUTANEOUS | Status: DC | PRN
  Administered 2019-05-21: 18:00:00 via TOPICAL
  Filled 2019-05-21 (×2): qty 7

## 2019-05-21 NOTE — Progress Notes (Signed)
Spoke to patient's son and updated him on mother's condition and current status

## 2019-05-21 NOTE — Progress Notes (Addendum)
PROGRESS NOTE    Tiffany Turner  HQP:[id number] DOB: 22-May-1941 DOA: 05/03/2019 PCP: Patient, No Pcp Per   Brief Narrative:  AVYANA Turner is Tiffany 78 y.o. WF PMHx  advanced dementia, htn, CAD   presents with confusion over the last several days.  Pt with h/o freq utis.  Cannot provide any history due to her dementia.  All history obtained from records.  Found to be septic from uti with aki cr over 5 and covid positive.  Given vanc and azactam.    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)   Acute renal failure (HCC)   Acute lower UTI   Dehydration with hypernatremia   Acute metabolic encephalopathy   COVID-19 virus infection   Pressure injury of skin   Hypernatremia   Hyperchloremia   Bacterial conjunctivitis   Alzheimer's dementia (HCC)   Protein-calorie malnutrition, moderate (HCC)   Metabolic acidosis   Failure to thrive in adult   Poor fluid intake  Covid 19 virus infection -Negative respiratory symptoms  -Placed on continuous O2 monitoring -10/29 PCXR bibasilar opacification see results below -Decadron 6 mg daily -Remdesivir per pharmacy protocol -Titrate O2 to maintain SPO2> 88% -Combivent QID -Flutter valve -Vitamins per Covid protocol COVID-19 Labs  No results for input(s): DDIMER, FERRITIN, LDH, CRP in the last 72 hours.  -10/29 SARS coronavirus positive -11/13 patient comfort care.  Appears comfortable   Proteus Mirabilis UTI -On admission patient does not meet criteria for sepsis. -Completed 5-day course antibiotics.  Acute renal failure? -Patient's last creatinine 05/01/2018   0.80, unsure of how much of this change is acute given patient's clinical picture, suspect chronic component. -Per admission note family does not desire HD. Recent Labs  Lab  05/15/19 1215  CREATININE 0.70  -Avoid nephrotoxic medication -Creatinine improving with hydration.  Severe metabolic derangement -Apparent patient has not been eating or drinking appropriately for at least weeks.  Will attempt to correct underlying metabolic abnormalities.  Metabolic acidosis -1 amp of Na-bicarb  Hypernatremia/hyperchloremia -Secondary to severe dehydration  Essential HTN -Stable off BP medication  Alzheimer's dementia -Unsure of baseline currently.  Increase response to painful stimuli after 24 hours of hydration.    Acute metabolic encephalopathy -Multifactorial severe metabolic derangement secondary to severe dehydration and malnourishment, infection -Correct underlying causes -11/3 CT W0 contrast head; negative for acute findings see results below    Bacterial Conjunctivitis -Erythromycin ointment x7 days  Hypokalemia -Potassium goal> 4  Hypomagnesmia -Magnesium goal> 2  Leukocytosis -Patient afebrile, negative left shift, negative bands most likely secondary to steroids   Protein calorie malnutrition moderate  -Have been attempting to contact son since admission unsuccessfully will insert NG tube, medical necessity in order to provide nutrition for patient. -10/31-second time consulted nutrition for enteral feeds  -10/31 Albumin 25 g -11/3 Albumin 25 g; will help with edema  Pain management -11/3 order to nursing staff; they have been quick to want to use narcotics in this frail older woman so the following orders have been placed.   Please do not use benzodiazepines or narcotics on this patient unless other means have been exhausted.  Positioning of patient, heating/cooling packs to any areas of discomfort, Tylenol, or Toradol.  Goals of care -11/3 attempting to engage Adult Protective Services and to case secondary to Apparent patient has not been eating  or drinking appropriately for at least weeks.  11/3 spoke with NCM Crystal, who will  consult with LCSW Sophire and will ensure Adult Protective Services are informed of this case.  I informed son that had called casein today and was waiting to hear back from APS. -11/13 full comfort care     DVT prophylaxis: Lovenox Code Status: DNR Family Communication: 11/14 called Thayer OhmChris (son)  did not answer phone and his mailbox is full.    Disposition Plan: Comfort care   Consultants:    Procedures/Significant Events:  PCXR 10/29;-Stable bibasilar opacities are noted concerning for pneumonia or atelectasis.  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 -No acute intracranial abnormality. Specifically, no hemorrhage, hydrocephalus, mass lesion, acute infarction, or significant intracranial injury.  -atrophy and chronic small vessel disease changes.    I have personally reviewed and interpreted all radiology studies and my findings are as above.  VENTILATOR SETTINGS:    Cultures 10/26 urine positive PROTEUS MIRABILIS  10/26 blood LEFT hand NGTD 10/26 blood RIGHT wrist NGTD 10/29 SARS coronavirus positive    Antimicrobials: Anti-infectives (From admission, onward)   Start     Stop   05/15/19 1400  cefTRIAXone (ROCEPHIN) 1 g in sodium chloride 0.9 % 100 mL IVPB     05/19/19 2359   05/04/19 1700  cefTRIAXone (ROCEPHIN) 1 g in sodium chloride 0.9 % 100 mL IVPB     05/08/19 1832   05/04/19 1600  remdesivir 100 mg in sodium chloride 0.9 % 250 mL IVPB     05/07/19 1602   05/04/19 1300  fosfomycin (MONUROL) packet 3 g  Status:  Discontinued     05/04/19 1605   05/04/19 1200  cefTRIAXone (ROCEPHIN) 1 g in sodium chloride 0.9 % 100 mL IVPB  Status:  Discontinued     05/04/19 1219   05/03/19 1600  ciprofloxacin (CIPRO) IVPB 200 mg  Status:  Discontinued     05/04/19 1148   05/03/19 1600  remdesivir 200 mg in sodium chloride 0.9 % 250 mL IVPB     05/03/19 1730   05/03/19 0600  aztreonam (AZACTAM) 0.5 g in dextrose 5 %  50 mL IVPB  Status:  Discontinued     05/03/19 0815   05/03/19 0048  vancomycin variable dose per unstable renal function (pharmacist dosing)  Status:  Discontinued     05/03/19 0815       Devices    LINES / TUBES:      Continuous Infusions: . dextrose 5% lactated ringers 50 mL/hr at 05/21/19 0109     Objective: Vitals:   05/19/19 1500 05/20/19 0413 05/20/19 0900 05/21/19 0400  BP: (!) 90/40  (!) 125/50   Pulse:   96 89  Resp: 10  18   Temp: (!) 97.2 F (36.2 C)  98 F (36.7 C) 98.3 F (36.8 C)  TempSrc: Axillary  Axillary Axillary  SpO2:   98% 96%  Weight:  59 kg    Height:        Intake/Output Summary (Last 24 hours) at 05/21/2019 0754 Last data filed at 05/20/2019 1800 Gross per 24 hour  Intake 0 ml  Output 800 ml  Net -800 ml   Filed Weights   05/18/19 0500 05/19/19 0430 05/20/19 0413  Weight: 59 kg 59.2 kg 59 kg   Physical Exam:  General: No acute respiratory distress, unresponsive appears comfortable Eyes: negative scleral hemorrhage, negative anisocoria, negative icterus ENT: Negative Runny nose, negative gingival bleeding,  Neck:  Negative scars, masses, torticollis, lymphadenopathy, JVD Lungs: Clear to auscultation bilaterally without wheezes or crackles Cardiovascular: Regular rate and rhythm without murmur gallop or rub normal S1 and S2   .     Data Reviewed: Care during the described time interval was provided by me .  I have reviewed this patient's available data, including medical history, events of note, physical examination, and all test results as part of my evaluation.   CBC: Recent Labs  Lab 05/15/19 1215  WBC 10.1  NEUTROABS 8.0*  HGB 9.1*  HCT 27.3*  MCV 102.6*  PLT 329   Basic Metabolic Panel: Recent Labs  Lab 05/15/19 1215  NA 136  K 4.2  CL 107  CO2 19*  GLUCOSE 107*  BUN 35*  CREATININE 0.70  CALCIUM 8.5*   GFR: Estimated Creatinine Clearance: 49.2 mL/min (by C-G formula based on SCr of 0.7 mg/dL). Liver  Function Tests: No results for input(s): AST, ALT, ALKPHOS, BILITOT, PROT, ALBUMIN in the last 168 hours. No results for input(s): LIPASE, AMYLASE in the last 168 hours. No results for input(s): AMMONIA in the last 168 hours. Coagulation Profile: No results for input(s): INR, PROTIME in the last 168 hours. Cardiac Enzymes: No results for input(s): CKTOTAL, CKMB, CKMBINDEX, TROPONINI in the last 168 hours. BNP (last 3 results) No results for input(s): PROBNP in the last 8760 hours. HbA1C: No results for input(s): HGBA1C in the last 72 hours. CBG: Recent Labs  Lab 05/17/19 1627 05/18/19 0003 05/18/19 0413 05/18/19 0731 05/18/19 1103  GLUCAP 97 86 89 95 92   Lipid Profile: No results for input(s): CHOL, HDL, LDLCALC, TRIG, CHOLHDL, LDLDIRECT in the last 72 hours. Thyroid Function Tests: No results for input(s): TSH, T4TOTAL, FREET4, T3FREE, THYROIDAB in the last 72 hours. Anemia Panel: No results for input(s): VITAMINB12, FOLATE, FERRITIN, TIBC, IRON, RETICCTPCT in the last 72 hours. Urine analysis:    Component Value Date/Time   COLORURINE AMBER (Tiffany) 05/02/2019 0826   APPEARANCEUR TURBID (Tiffany) 05/02/2019 0826   APPEARANCEUR Turbid 07/29/2014 1548   LABSPEC 1.020 05/02/2019 0826   LABSPEC 1.018 07/29/2014 1548   PHURINE 7.0 05/02/2019 0826   GLUCOSEU NEGATIVE 05/02/2019 0826   GLUCOSEU Negative 07/29/2014 1548   HGBUR LARGE (Tiffany) 05/02/2019 0826   BILIRUBINUR NEGATIVE 05/02/2019 0826   KETONESUR NEGATIVE 05/02/2019 0826   PROTEINUR >=300 (Tiffany) 05/02/2019 0826   NITRITE NEGATIVE 05/02/2019 0826   LEUKOCYTESUR MODERATE (Tiffany) 05/02/2019 0826   LEUKOCYTESUR 3+ 07/29/2014 1548   Sepsis Labs: @LABRCNTIP (procalcitonin:4,lacticidven:4)  ) No results found for this or any previous visit (from the past 240 hour(s)).       Radiology Studies: No results found.      Scheduled Meds: . erythromycin   Both Eyes Q6H  . mouth rinse  15 mL Mouth Rinse BID   Continuous Infusions:  . dextrose 5% lactated ringers 50 mL/hr at 05/21/19 0109     LOS: 18 days   The patient is critically ill with multiple organ systems failure and requires high complexity decision making for assessment and support, frequent evaluation and titration of therapies, application of advanced monitoring technologies and extensive interpretation of multiple databases. Critical Care Time devoted to patient care services described in this note  Time spent: 40 minutes     , Geraldo Docker, MD [facility] Pager [phone]  If 7PM-7AM, please contact night-coverage [email or web address] Password [facility] 05/21/2019, 7:54 AM

## 2019-05-22 NOTE — Progress Notes (Signed)
Spoke to pt's son and updated him on pt's current status. All questions answered and no concerns voiced.

## 2019-05-22 NOTE — Progress Notes (Signed)
PROGRESS NOTE    Tiffany Turner  ZOX:[id number] DOB: 10/16/1940 DOA: 05/03/2019 PCP: Patient, No Pcp Per   Brief Narrative:  Tiffany Turner is a 78 y.o. WF PMHx  advanced dementia, htn, CAD   presents with confusion over the last several days.  Pt with h/o freq utis.  Cannot provide any history due to her dementia.  All history obtained from records.  Found to be septic from uti with aki cr over 5 and covid positive.  Given vanc and azactam.    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)   Acute renal failure (HCC)   Acute lower UTI   Dehydration with hypernatremia   Acute metabolic encephalopathy   COVID-19 virus infection   Pressure injury of skin   Hypernatremia   Hyperchloremia   Bacterial conjunctivitis   Alzheimer's dementia (HCC)   Protein-calorie malnutrition, moderate (HCC)   Metabolic acidosis   Failure to thrive in adult   Poor fluid intake  Covid 19 virus infection -Negative respiratory symptoms  -Placed on continuous O2 monitoring -10/29 PCXR bibasilar opacification see results below -Decadron 6 mg daily -Remdesivir per pharmacy protocol -Titrate O2 to maintain SPO2> 88% -Combivent QID -Flutter valve -Vitamins per Covid protocol COVID-19 Labs  No results for input(s): DDIMER, FERRITIN, LDH, CRP in the last 72 hours.  -10/29 SARS coronavirus positive -11/13 patient comfort care.  Appears comfortable   Proteus Mirabilis UTI -On admission patient does not meet criteria for sepsis. -Completed 5-day course antibiotics.  Acute renal failure? -Patient's last creatinine 05/01/2018   0.80, unsure of how much of this change is acute given patient's clinical picture, suspect chronic component. -Per admission note family does not desire HD. No results for  input(s): CREATININE in the last 168 hours.-Avoid nephrotoxic medication -Creatinine improving with hydration.  Severe metabolic derangement -Apparent patient has not been eating or drinking appropriately for at least weeks.  Will attempt to correct underlying metabolic abnormalities.  Metabolic acidosis -1 amp of Na-bicarb  Hypernatremia/hyperchloremia -Secondary to severe dehydration  Essential HTN -Stable off BP medication  Alzheimer's dementia -Unsure of baseline currently.  Increase response to painful stimuli after 24 hours of hydration.    Acute metabolic encephalopathy -Multifactorial severe metabolic derangement secondary to severe dehydration and malnourishment, infection -Correct underlying causes -11/3 CT W0 contrast head; negative for acute findings see results below    Bacterial Conjunctivitis -Erythromycin ointment x7 days  Hypokalemia -Potassium goal> 4  Hypomagnesmia -Magnesium goal> 2  Leukocytosis -Patient afebrile, negative left shift, negative bands most likely secondary to steroids   Protein calorie malnutrition moderate  -Have been attempting to contact son since admission unsuccessfully will insert NG tube, medical necessity in order to provide nutrition for patient. -10/31-second time consulted nutrition for enteral feeds  -10/31 Albumin 25 g -11/3 Albumin 25 g; will help with edema  Pain management -11/3 order to nursing staff; they have been quick to want to use narcotics in this frail older woman so the following orders have been placed.   Please do not use benzodiazepines or narcotics on this patient unless other means have been exhausted.  Positioning of patient, heating/cooling packs to any areas of discomfort, Tylenol, or Toradol.  Goals of care -11/3 attempting to engage Adult Protective Services and to case secondary to Apparent patient has not been eating or  drinking appropriately for at least weeks.  11/3 spoke with NCM Crystal, who  will consult with LCSW Sophire and will ensure Adult Protective Services are informed of this case.  I informed son that had called casein today and was waiting to hear back from APS. -11/13 full comfort care     DVT prophylaxis: Lovenox Code Status: DNR Family Communication: 11/14 called Gerald Stabs (son)  did not answer phone and his mailbox is full.    Disposition Plan: Comfort care   Consultants:    Procedures/Significant Events:  PCXR 10/29;-Stable bibasilar opacities are noted concerning for pneumonia or atelectasis.  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 -No acute intracranial abnormality. Specifically, no hemorrhage, hydrocephalus, mass lesion, acute infarction, or significant intracranial injury.  -atrophy and chronic small vessel disease changes.    I have personally reviewed and interpreted all radiology studies and my findings are as above.  VENTILATOR SETTINGS:    Cultures 10/26 urine positive PROTEUS MIRABILIS  10/26 blood LEFT hand NGTD 10/26 blood RIGHT wrist NGTD 10/29 SARS coronavirus positive    Antimicrobials: Anti-infectives (From admission, onward)   Start     Stop   05/15/19 1400  cefTRIAXone (ROCEPHIN) 1 g in sodium chloride 0.9 % 100 mL IVPB     05/19/19 2359   05/04/19 1700  cefTRIAXone (ROCEPHIN) 1 g in sodium chloride 0.9 % 100 mL IVPB     05/08/19 1832   05/04/19 1600  remdesivir 100 mg in sodium chloride 0.9 % 250 mL IVPB     05/07/19 1602   05/04/19 1300  fosfomycin (MONUROL) packet 3 g  Status:  Discontinued     05/04/19 1605   05/04/19 1200  cefTRIAXone (ROCEPHIN) 1 g in sodium chloride 0.9 % 100 mL IVPB  Status:  Discontinued     05/04/19 1219   05/03/19 1600  ciprofloxacin (CIPRO) IVPB 200 mg  Status:  Discontinued     05/04/19 1148   05/03/19 1600  remdesivir 200 mg in sodium chloride 0.9 % 250 mL IVPB     05/03/19 1730   05/03/19 0600  aztreonam (AZACTAM) 0.5 g in dextrose  5 % 50 mL IVPB  Status:  Discontinued     05/03/19 0815   05/03/19 0048  vancomycin variable dose per unstable renal function (pharmacist dosing)  Status:  Discontinued     05/03/19 0815       Devices    LINES / TUBES:      Continuous Infusions: . dextrose 5% lactated ringers 50 mL/hr at 05/22/19 0500     Objective: Vitals:   05/21/19 1600 05/21/19 2000 05/22/19 0411 05/22/19 0700  BP:  (!) 105/55  (!) 146/56  Pulse: (!) 101 (!) 115  (!) 120  Resp:  20  16  Temp: 98 F (36.7 C) 97.8 F (36.6 C)  99.4 F (37.4 C)  TempSrc:  Axillary  Axillary  SpO2: 96% 96%  100%  Weight:   59.7 kg   Height:        Intake/Output Summary (Last 24 hours) at 05/22/2019 1559 Last data filed at 05/22/2019 1200 Gross per 24 hour  Intake 699.82 ml  Output 1600 ml  Net -900.18 ml   Filed Weights   05/19/19 0430 05/20/19 0413 05/22/19 0411  Weight: 59.2 kg 59 kg 59.7 kg   Physical Exam:  General: No acute respiratory distress, unresponsive appears comfortable Eyes: negative scleral hemorrhage, negative anisocoria, negative icterus ENT: Negative Runny nose, negative gingival  bleeding, Neck:  Negative scars, masses, torticollis, lymphadenopathy, JVD Lungs: Clear to auscultation bilaterally without wheezes or crackles Cardiovascular: Regular rate and rhythm without murmur gallop or rub normal S1 and S2   .     Data Reviewed: Care during the described time interval was provided by me .  I have reviewed this patient's available data, including medical history, events of note, physical examination, and all test results as part of my evaluation.   CBC: No results for input(s): WBC, NEUTROABS, HGB, HCT, MCV, PLT in the last 168 hours. Basic Metabolic Panel: No results for input(s): NA, K, CL, CO2, GLUCOSE, BUN, CREATININE, CALCIUM, MG, PHOS in the last 168 hours. GFR: Estimated Creatinine Clearance: 49.2 mL/min (by C-G formula based on SCr of 0.7 mg/dL). Liver Function Tests: No  results for input(s): AST, ALT, ALKPHOS, BILITOT, PROT, ALBUMIN in the last 168 hours. No results for input(s): LIPASE, AMYLASE in the last 168 hours. No results for input(s): AMMONIA in the last 168 hours. Coagulation Profile: No results for input(s): INR, PROTIME in the last 168 hours. Cardiac Enzymes: No results for input(s): CKTOTAL, CKMB, CKMBINDEX, TROPONINI in the last 168 hours. BNP (last 3 results) No results for input(s): PROBNP in the last 8760 hours. HbA1C: No results for input(s): HGBA1C in the last 72 hours. CBG: Recent Labs  Lab 05/17/19 1627 05/18/19 0003 05/18/19 0413 05/18/19 0731 05/18/19 1103  GLUCAP 97 86 89 95 92   Lipid Profile: No results for input(s): CHOL, HDL, LDLCALC, TRIG, CHOLHDL, LDLDIRECT in the last 72 hours. Thyroid Function Tests: No results for input(s): TSH, T4TOTAL, FREET4, T3FREE, THYROIDAB in the last 72 hours. Anemia Panel: No results for input(s): VITAMINB12, FOLATE, FERRITIN, TIBC, IRON, RETICCTPCT in the last 72 hours. Urine analysis:    Component Value Date/Time   COLORURINE AMBER (A) 05/02/2019 0826   APPEARANCEUR TURBID (A) 05/02/2019 0826   APPEARANCEUR Turbid 07/29/2014 1548   LABSPEC 1.020 05/02/2019 0826   LABSPEC 1.018 07/29/2014 1548   PHURINE 7.0 05/02/2019 0826   GLUCOSEU NEGATIVE 05/02/2019 0826   GLUCOSEU Negative 07/29/2014 1548   HGBUR LARGE (A) 05/02/2019 0826   BILIRUBINUR NEGATIVE 05/02/2019 0826   KETONESUR NEGATIVE 05/02/2019 0826   PROTEINUR >=300 (A) 05/02/2019 0826   NITRITE NEGATIVE 05/02/2019 0826   LEUKOCYTESUR MODERATE (A) 05/02/2019 0826   LEUKOCYTESUR 3+ 07/29/2014 1548   Sepsis Labs: @LABRCNTIP (procalcitonin:4,lacticidven:4)  ) No results found for this or any previous visit (from the past 240 hour(s)).       Radiology Studies: No results found.      Scheduled Meds: . erythromycin   Both Eyes Q6H  . mouth rinse  15 mL Mouth Rinse BID   Continuous Infusions: . dextrose 5%  lactated ringers 50 mL/hr at 05/22/19 0500     LOS: 19 days   The patient is critically ill with multiple organ systems failure and requires high complexity decision making for assessment and support, frequent evaluation and titration of therapies, application of advanced monitoring technologies and extensive interpretation of multiple databases. Critical Care Time devoted to patient care services described in this note  Time spent: 40 minutes     Zaim Nitta, 05/24/19, MD [facility] Pager [phone]  If 7PM-7AM, please contact night-coverage [email or web address] Password [email or web address] 05/22/2019, 3:59 PM

## 2019-05-23 NOTE — Progress Notes (Signed)
Pt son Gerald Stabs updated twice today, all questions & concerns addressed. PRN IV ativan given x2 today w/ good relief. Pt unable to eat/swallow safely at this time. Mouth swabbed several times throughout shift. Purewick remains in place to keep skin dry. Pt kept comfortable throughout day. RA

## 2019-05-23 NOTE — Progress Notes (Addendum)
PROGRESS NOTE    Tiffany Turner  FXT:[id number] DOB: Jul 03, 1941 DOA: 05/03/2019 PCP: Patient, No Pcp Per   Brief Narrative:  Tiffany Turner is a 78 y.o. WF PMHx  advanced dementia, htn, CAD   presents with confusion over the last several days.  Pt with h/o freq utis.  Cannot provide any history due to her dementia.  All history obtained from records.  Found to be septic from uti with aki cr over 5 and covid positive.  Given vanc and azactam.    Subjective: 11/16 appears comfortable, eyes open, does not track you or follow commands.  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)   Acute renal failure (HCC)   Acute lower UTI   Dehydration with hypernatremia   Acute metabolic encephalopathy   COVID-19 virus infection   Pressure injury of skin   Hypernatremia   Hyperchloremia   Bacterial conjunctivitis   Alzheimer's dementia (HCC)   Protein-calorie malnutrition, moderate (HCC)   Metabolic acidosis   Failure to thrive in adult   Poor fluid intake  Covid 19 virus infection -Negative respiratory symptoms  -Placed on continuous O2 monitoring -10/29 PCXR bibasilar opacification see results below -Decadron 6 mg daily -Remdesivir per pharmacy protocol -Titrate O2 to maintain SPO2> 88% -Combivent QID -Flutter valve -Vitamins per Covid protocol COVID-19 Labs  No results for input(s): DDIMER, FERRITIN, LDH, CRP in the last 72 hours.  -10/29 SARS coronavirus positive -11/13 patient comfort care.  Appears comfortable   Proteus Mirabilis UTI -On admission patient does not meet criteria for sepsis. -Completed 5-day course antibiotics.  Acute renal failure? -Patient's last creatinine 05/01/2018   0.80, unsure of how much of this change is acute given patient's clinical picture, suspect chronic component. -Per admission note  family does not desire HD. No results for input(s): CREATININE in the last 168 hours.-Avoid nephrotoxic medication -Creatinine improving with hydration.  Severe metabolic derangement -Apparent patient has not been eating or drinking appropriately for at least weeks.  Will attempt to correct underlying metabolic abnormalities.  Metabolic acidosis -1 amp of Na-bicarb  Hypernatremia/hyperchloremia -Secondary to severe dehydration  Essential HTN -Stable off BP medication  Alzheimer's dementia -Unsure of baseline currently.  Increase response to painful stimuli after 24 hours of hydration.    Acute metabolic encephalopathy -Multifactorial severe metabolic derangement secondary to severe dehydration and malnourishment, infection -Correct underlying causes -11/3 CT W0 contrast head; negative for acute findings see results below    Bacterial Conjunctivitis -Erythromycin ointment x7 days  Hypokalemia -Potassium goal> 4  Hypomagnesmia -Magnesium goal> 2  Leukocytosis -Patient afebrile, negative left shift, negative bands most likely secondary to steroids   Protein calorie malnutrition moderate  -Have been attempting to contact son since admission unsuccessfully will insert NG tube, medical necessity in order to provide nutrition for patient. -10/31-second time consulted nutrition for enteral feeds  -10/31 Albumin 25 g -11/3 Albumin 25 g; will help with edema  Pain management -11/3 order to nursing staff; they have been quick to want to use narcotics in this frail older woman so the following orders have been placed.   Please do not use benzodiazepines or narcotics on this patient unless other means have been exhausted.  Positioning of patient, heating/cooling packs to any areas of discomfort, Tylenol, or Toradol.  Goals of care -11/3 attempting to engage Adult Protective Services and to case  secondary to Apparent patient has not been eating or drinking appropriately for at least  weeks.  11/3 spoke with NCM Crystal, who will consult with LCSW Sophire and will ensure Adult Protective Services are informed of this case.  I informed son that had called casein today and was waiting to hear back from APS. -11/13 full comfort care     DVT prophylaxis: Lovenox Code Status: DNR Family Communication: 11/16 called Thayer OhmChris (son)  did not answer phone and his mailbox is full.    Disposition Plan: Comfort care   Consultants:    Procedures/Significant Events:  PCXR 10/29;-Stable bibasilar opacities are noted concerning for pneumonia or atelectasis.  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 -No acute intracranial abnormality. Specifically, no hemorrhage, hydrocephalus, mass lesion, acute infarction, or significant intracranial injury.  -atrophy and chronic small vessel disease changes.    I have personally reviewed and interpreted all radiology studies and my findings are as above.  VENTILATOR SETTINGS:    Cultures 10/26 urine positive PROTEUS MIRABILIS  10/26 blood LEFT hand NGTD 10/26 blood RIGHT wrist NGTD 10/29 SARS coronavirus positive    Antimicrobials: Anti-infectives (From admission, onward)   Start     Stop   05/15/19 1400  cefTRIAXone (ROCEPHIN) 1 g in sodium chloride 0.9 % 100 mL IVPB     05/19/19 2359   05/04/19 1700  cefTRIAXone (ROCEPHIN) 1 g in sodium chloride 0.9 % 100 mL IVPB     05/08/19 1832   05/04/19 1600  remdesivir 100 mg in sodium chloride 0.9 % 250 mL IVPB     05/07/19 1602   05/04/19 1300  fosfomycin (MONUROL) packet 3 g  Status:  Discontinued     05/04/19 1605   05/04/19 1200  cefTRIAXone (ROCEPHIN) 1 g in sodium chloride 0.9 % 100 mL IVPB  Status:  Discontinued     05/04/19 1219   05/03/19 1600  ciprofloxacin (CIPRO) IVPB 200 mg  Status:  Discontinued     05/04/19 1148   05/03/19 1600  remdesivir 200 mg in sodium chloride 0.9 % 250 mL IVPB     05/03/19 1730   05/03/19  0600  aztreonam (AZACTAM) 0.5 g in dextrose 5 % 50 mL IVPB  Status:  Discontinued     05/03/19 0815   05/03/19 0048  vancomycin variable dose per unstable renal function (pharmacist dosing)  Status:  Discontinued     05/03/19 0815       Devices    LINES / TUBES:      Continuous Infusions: . dextrose 5% lactated ringers 50 mL/hr at 05/23/19 0400     Objective: Vitals:   05/21/19 2000 05/22/19 0411 05/22/19 0700 05/23/19 0408  BP: (!) 105/55  (!) 146/56   Pulse: (!) 115  (!) 120   Resp: 20  16   Temp: 97.8 F (36.6 C)  99.4 F (37.4 C)   TempSrc: Axillary  Axillary   SpO2: 96%  100%   Weight:  59.7 kg  59.4 kg  Height:        Intake/Output Summary (Last 24 hours) at 05/23/2019 1557 Last data filed at 05/23/2019 0400 Gross per 24 hour  Intake 636.67 ml  Output 650 ml  Net -13.33 ml   Filed Weights   05/20/19 0413 05/22/19 0411 05/23/19 0408  Weight: 59 kg 59.7 kg 59.4 kg   Physical Exam:  General: Eyes open, does not track you for follow commands, no acute respiratory distress, appears comfortable  Eyes: negative scleral hemorrhage, negative anisocoria, negative icterus ENT: Negative Runny nose, negative gingival bleeding, Neck:  Negative scars, masses, torticollis, lymphadenopathy, JVD Lungs: Clear to auscultation bilaterally without wheezes or crackles Cardiovascular: Regular rate and rhythm without murmur gallop or rub normal S1 and S2     Data Reviewed: Care during the described time interval was provided by me .  I have reviewed this patient's available data, including medical history, events of note, physical examination, and all test results as part of my evaluation.   CBC: No results for input(s): WBC, NEUTROABS, HGB, HCT, MCV, PLT in the last 168 hours. Basic Metabolic Panel: No results for input(s): NA, K, CL, CO2, GLUCOSE, BUN, CREATININE, CALCIUM, MG, PHOS in the last 168 hours. GFR: Estimated Creatinine Clearance: 49.2 mL/min (by C-G formula  based on SCr of 0.7 mg/dL). Liver Function Tests: No results for input(s): AST, ALT, ALKPHOS, BILITOT, PROT, ALBUMIN in the last 168 hours. No results for input(s): LIPASE, AMYLASE in the last 168 hours. No results for input(s): AMMONIA in the last 168 hours. Coagulation Profile: No results for input(s): INR, PROTIME in the last 168 hours. Cardiac Enzymes: No results for input(s): CKTOTAL, CKMB, CKMBINDEX, TROPONINI in the last 168 hours. BNP (last 3 results) No results for input(s): PROBNP in the last 8760 hours. HbA1C: No results for input(s): HGBA1C in the last 72 hours. CBG: Recent Labs  Lab 05/17/19 1627 05/18/19 0003 05/18/19 0413 05/18/19 0731 05/18/19 1103  GLUCAP 97 86 89 95 92   Lipid Profile: No results for input(s): CHOL, HDL, LDLCALC, TRIG, CHOLHDL, LDLDIRECT in the last 72 hours. Thyroid Function Tests: No results for input(s): TSH, T4TOTAL, FREET4, T3FREE, THYROIDAB in the last 72 hours. Anemia Panel: No results for input(s): VITAMINB12, FOLATE, FERRITIN, TIBC, IRON, RETICCTPCT in the last 72 hours. Urine analysis:    Component Value Date/Time   COLORURINE AMBER (A) 05/02/2019 0826   APPEARANCEUR TURBID (A) 05/02/2019 0826   APPEARANCEUR Turbid 07/29/2014 1548   LABSPEC 1.020 05/02/2019 0826   LABSPEC 1.018 07/29/2014 1548   PHURINE 7.0 05/02/2019 0826   GLUCOSEU NEGATIVE 05/02/2019 0826   GLUCOSEU Negative 07/29/2014 1548   HGBUR LARGE (A) 05/02/2019 0826   BILIRUBINUR NEGATIVE 05/02/2019 0826   KETONESUR NEGATIVE 05/02/2019 0826   PROTEINUR >=300 (A) 05/02/2019 0826   NITRITE NEGATIVE 05/02/2019 0826   LEUKOCYTESUR MODERATE (A) 05/02/2019 0826   LEUKOCYTESUR 3+ 07/29/2014 1548   Sepsis Labs: @LABRCNTIP (procalcitonin:4,lacticidven:4)  ) No results found for this or any previous visit (from the past 240 hour(s)).       Radiology Studies: No results found.      Scheduled Meds: . erythromycin   Both Eyes Q6H  . mouth rinse  15 mL Mouth  Rinse BID   Continuous Infusions: . dextrose 5% lactated ringers 50 mL/hr at 05/23/19 0400     LOS: 20 days   The patient is critically ill with multiple organ systems failure and requires high complexity decision making for assessment and support, frequent evaluation and titration of therapies, application of advanced monitoring technologies and extensive interpretation of multiple databases. Critical Care Time devoted to patient care services described in this note  Time spent: 40 minutes     Jilleen Essner, Geraldo Docker, MD [facility] Pager [phone]  If 7PM-7AM, please contact night-coverage [email or web address] Password [facility] 05/23/2019, 3:57 PM

## 2019-05-24 NOTE — Progress Notes (Signed)
PROGRESS NOTE    Tiffany Turner  [id number]:[id number] DOB: Jun 10, 1941 DOA: 05/03/2019 PCP: Patient, No Pcp Per   Brief Narrative:  Tiffany Turner is a 78 y.o. WF PMHx  advanced dementia, htn, CAD   presents with confusion over the last several days.  Pt with h/o freq utis.  Cannot provide any history due to her dementia.  All history obtained from records.  Found to be septic from uti with aki cr over 5 and covid positive.  Given vanc and azactam.    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 ([place])   Acute renal failure ([place])   Acute lower UTI   Dehydration with hypernatremia   Acute metabolic encephalopathy   COVID-19 virus infection   Pressure injury of skin   Hypernatremia   Hyperchloremia   Bacterial conjunctivitis   Alzheimer's dementia ([place])   Protein-calorie malnutrition, moderate (HCC)   Metabolic acidosis   Failure to thrive in adult   Poor fluid intake  Covid 19 virus infection -Negative respiratory symptoms  -Placed on continuous O2 monitoring -10/29 PCXR bibasilar opacification see results below -Decadron 6 mg daily -Remdesivir per pharmacy protocol -Titrate O2 to maintain SPO2> 88% -Combivent QID -Flutter valve -Vitamins per Covid protocol COVID-19 Labs  No results for input(s): DDIMER, FERRITIN, LDH, CRP in the last 72 hours.  -10/29 SARS coronavirus positive -11/13 patient comfort care.  Appears comfortable   Proteus Mirabilis UTI -On admission patient does not meet criteria for sepsis. -Completed 5-day course antibiotics.  Acute renal failure? -Patient's last creatinine 05/01/2018   0.80, unsure of how much of this change is acute given patient's clinical picture, suspect chronic component. -Per admission note family  does not desire HD. No results for input(s): CREATININE in the last 168 hours.-Avoid nephrotoxic medication -Creatinine improving with hydration.  Severe metabolic derangement -Apparent patient has not been eating or drinking appropriately for at least weeks.  Will attempt to correct underlying metabolic abnormalities.  Metabolic acidosis -1 amp of Na-bicarb  Hypernatremia/hyperchloremia -Secondary to severe dehydration  Essential HTN -Stable off BP medication  Alzheimer's dementia -Unsure of baseline currently.  Increase response to painful stimuli after 24 hours of hydration.    Acute metabolic encephalopathy -Multifactorial severe metabolic derangement secondary to severe dehydration and malnourishment, infection -Correct underlying causes -11/3 CT W0 contrast head; negative for acute findings see results below    Bacterial Conjunctivitis -Erythromycin ointment x7 days  Hypokalemia -Potassium goal> 4  Hypomagnesmia -Magnesium goal> 2  Leukocytosis -Patient afebrile, negative left shift, negative bands most likely secondary to steroids   Protein calorie malnutrition moderate  -Have been attempting to contact son since admission unsuccessfully will insert NG tube, medical necessity in order to provide nutrition for patient. -10/31-second time consulted nutrition for enteral feeds  -10/31 Albumin 25 g -11/3 Albumin 25 g; will help with edema  Pain management -11/3 order to nursing staff; they have been quick to want to use narcotics in this frail older woman so the following orders have been placed.   Please do not use benzodiazepines or narcotics on this patient unless other means have been exhausted.  Positioning of patient, heating/cooling packs to any areas of discomfort, Tylenol, or Toradol.  Goals of care -11/3 attempting to engage Adult Protective Services  and to case secondary to Apparent patient has not been eating or drinking appropriately for at least weeks.   11/3 spoke with NCM Crystal, who will consult with LCSW Sophire and will ensure Adult Protective Services are informed of this case.  I informed son that had called casein today and was waiting to hear back from APS. -11/13 full comfort care     DVT prophylaxis: Lovenox Code Status: DNR Family Communication: 11/16 called [name] (son)  did not answer phone and his mailbox is full.    Disposition Plan: Comfort care   Consultants:    Procedures/Significant Events:  PCXR 10/29;-Stable bibasilar opacities are noted concerning for pneumonia or atelectasis.  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 -No acute intracranial abnormality. Specifically, no hemorrhage, hydrocephalus, mass lesion, acute infarction, or significant intracranial injury.  -atrophy and chronic small vessel disease changes.    I have personally reviewed and interpreted all radiology studies and my findings are as above.  VENTILATOR SETTINGS:    Cultures 10/26 urine positive PROTEUS MIRABILIS  10/26 blood LEFT hand NGTD 10/26 blood RIGHT wrist NGTD 10/29 SARS coronavirus positive    Antimicrobials: Anti-infectives (From admission, onward)   Start     Stop   05/15/19 1400  cefTRIAXone (ROCEPHIN) 1 g in sodium chloride 0.9 % 100 mL IVPB     05/19/19 2359   05/04/19 1700  cefTRIAXone (ROCEPHIN) 1 g in sodium chloride 0.9 % 100 mL IVPB     05/08/19 1832   05/04/19 1600  remdesivir 100 mg in sodium chloride 0.9 % 250 mL IVPB     05/07/19 1602   05/04/19 1300  fosfomycin (MONUROL) packet 3 g  Status:  Discontinued     05/04/19 1605   05/04/19 1200  cefTRIAXone (ROCEPHIN) 1 g in sodium chloride 0.9 % 100 mL IVPB  Status:  Discontinued     05/04/19 1219   05/03/19 1600  ciprofloxacin (CIPRO) IVPB 200 mg  Status:  Discontinued     05/04/19 1148   05/03/19 1600  remdesivir 200 mg in sodium chloride 0.9 % 250 mL IVPB     05/03/19 1730   05/03/19 0600   aztreonam (AZACTAM) 0.5 g in dextrose 5 % 50 mL IVPB  Status:  Discontinued     05/03/19 0815   05/03/19 0048  vancomycin variable dose per unstable renal function (pharmacist dosing)  Status:  Discontinued     05/03/19 0815       Devices    LINES / TUBES:      Continuous Infusions: . dextrose 5% lactated ringers 50 mL/hr at 05/24/19 0123     Objective: Vitals:   05/22/19 0700 05/23/19 0408 05/23/19 1045 05/24/19 0500  BP: (!) 146/56     Pulse: (!) 120     Resp: 16  16   Temp: 99.4 F (37.4 C)     TempSrc: Axillary     SpO2: 100%     Weight:  59.4 kg  57.7 kg  Height:        Intake/Output Summary (Last 24 hours) at 05/24/2019 0843 Last data filed at 05/23/2019 1845 Gross per 24 hour  Intake 25 ml  Output 650 ml  Net -625 ml   Filed Weights   05/22/19 0411 05/23/19 0408 05/24/19 0500  Weight: 59.7 kg 59.4 kg 57.7 kg   Physical Exam:  General: Eyes open, does not track you for follow commands, no acute respiratory distress, appears comfortable Eyes: negative  scleral hemorrhage, negative anisocoria, negative icterus ENT: Negative Runny nose, negative gingival bleeding, Neck:  Negative scars, masses, torticollis, lymphadenopathy, JVD Lungs: Clear to auscultation bilaterally without wheezes or crackles Cardiovascular: Regular rate and rhythm without murmur gallop or rub normal S1 and S2     Data Reviewed: Care during the described time interval was provided by me .  I have reviewed this patient's available data, including medical history, events of note, physical examination, and all test results as part of my evaluation.   CBC: No results for input(s): WBC, NEUTROABS, HGB, HCT, MCV, PLT in the last 168 hours. Basic Metabolic Panel: No results for input(s): NA, K, CL, CO2, GLUCOSE, BUN, CREATININE, CALCIUM, MG, PHOS in the last 168 hours. GFR: Estimated Creatinine Clearance: 49.2 mL/min (by C-G formula based on SCr of 0.7 mg/dL). Liver Function Tests: No  results for input(s): AST, ALT, ALKPHOS, BILITOT, PROT, ALBUMIN in the last 168 hours. No results for input(s): LIPASE, AMYLASE in the last 168 hours. No results for input(s): AMMONIA in the last 168 hours. Coagulation Profile: No results for input(s): INR, PROTIME in the last 168 hours. Cardiac Enzymes: No results for input(s): CKTOTAL, CKMB, CKMBINDEX, TROPONINI in the last 168 hours. BNP (last 3 results) No results for input(s): PROBNP in the last 8760 hours. HbA1C: No results for input(s): HGBA1C in the last 72 hours. CBG: Recent Labs  Lab 05/17/19 1627 05/18/19 0003 05/18/19 0413 05/18/19 0731 05/18/19 1103  GLUCAP 97 86 89 95 92   Lipid Profile: No results for input(s): CHOL, HDL, LDLCALC, TRIG, CHOLHDL, LDLDIRECT in the last 72 hours. Thyroid Function Tests: No results for input(s): TSH, T4TOTAL, FREET4, T3FREE, THYROIDAB in the last 72 hours. Anemia Panel: No results for input(s): VITAMINB12, FOLATE, FERRITIN, TIBC, IRON, RETICCTPCT in the last 72 hours. Urine analysis:    Component Value Date/Time   COLORURINE AMBER (A) 05/02/2019 0826   APPEARANCEUR TURBID (A) 05/02/2019 0826   APPEARANCEUR Turbid 07/29/2014 1548   LABSPEC 1.020 05/02/2019 0826   LABSPEC 1.018 07/29/2014 1548   PHURINE 7.0 05/02/2019 0826   GLUCOSEU NEGATIVE 05/02/2019 0826   GLUCOSEU Negative 07/29/2014 1548   HGBUR LARGE (A) 05/02/2019 0826   BILIRUBINUR NEGATIVE 05/02/2019 0826   KETONESUR NEGATIVE 05/02/2019 0826   PROTEINUR >=300 (A) 05/02/2019 0826   NITRITE NEGATIVE 05/02/2019 0826   LEUKOCYTESUR MODERATE (A) 05/02/2019 0826   LEUKOCYTESUR 3+ 07/29/2014 1548   Sepsis Labs: @LABRCNTIP (procalcitonin:4,lacticidven:4)  ) No results found for this or any previous visit (from the past 240 hour(s)).       Radiology Studies: No results found.      Scheduled Meds: . erythromycin   Both Eyes Q6H  . mouth rinse  15 mL Mouth Rinse BID   Continuous Infusions: . dextrose 5%  lactated ringers 50 mL/hr at 05/24/19 0123     LOS: 21 days   The patient is critically ill with multiple organ systems failure and requires high complexity decision making for assessment and support, frequent evaluation and titration of therapies, application of advanced monitoring technologies and extensive interpretation of multiple databases. Critical Care Time devoted to patient care services described in this note  Time spent: 40 minutes     WOODS, Roselind MessierURTIS J, MD [facility] Pager [phone]  If 7PM-7AM, please contact night-coverage [email or web address] Password [email or web address] 05/24/2019, 8:43 AM

## 2019-05-24 NOTE — Progress Notes (Signed)
Called pt's son Tiffany Turner & updated him. VSS/WNL Pt bathed, purewick & sacral foam dressing replaced w/ new. Heels elevated off bed, air boots on. Pt experienced significant discomfort w/ turning/bathing. Will frequently check purewick to make sure it stays in place.   ** 2mg  IV ativan **2mg  IV morphine  Given so far this shift  Mouth swabbed frequently **Pt looks very comfortable at this time.

## 2019-05-25 MED ORDER — SODIUM CHLORIDE 0.9 % IV SOLN: INTRAVENOUS | Status: DC

## 2019-05-25 MED ORDER — METOPROLOL TARTRATE 5 MG/5ML IV SOLN: 5.0000 mg | INTRAVENOUS | Status: DC | PRN

## 2019-05-25 MED ORDER — MORPHINE SULFATE (PF) 2 MG/ML IV SOLN
1.0000 mg | INTRAVENOUS | Status: DC | PRN
  Administered 2019-05-26 – 2019-06-01 (×9): 2 mg via INTRAVENOUS
  Filled 2019-05-25 (×9): qty 1

## 2019-05-25 NOTE — Progress Notes (Signed)
Spoke with son, updated, no change.

## 2019-05-25 NOTE — Progress Notes (Addendum)
Tiffany Turner  [id number]:[id number] DOB: 08-02-1940 DOA: 05/03/2019 PCP: Patient, No Pcp Per    Brief Narrative:  78 year old with a history of advanced dementia, HTN, frequent UTIs, and CAD who presented to the hospital with several days of increasing confusion.  Evaluation revealed sepsis related to a UTI with acute kidney injury.  She was incidentally found to be Covid positive as well.  Significant Events: 10/26 admit to [facility] with AMS -transfer to [facility]  COVID-19 specific Treatment: Decadron Remdesivir  Subjective: Appears to be resting comfortably at the time of my visit.  No evidence of respiratory distress or uncontrolled pain.  Assessment & Plan:  Terminal dementia - Comfort focused care Anticipate hospital death   Suspected Aspiration Pneumonia  Unable to safely be cleared for any consistent diet by SLP -clear high risk for recurrent aspiration with any oral intake  COVID pneumonia  Completed treatment with remdesivir and dexamethasone  Proteus UTI  Completed 5-day course of antibiotics  Acute kidney injury Baseline creatinine 0.8 in October 2019 November 20  Hypernatremia Due to dehydration is with inability to safely take in orally and pills  Hypokalemia Due to insufficient nutritional intake  Hypomagnesemia Due to insufficient nutritional intake  HTN  Advanced Alzheimer's dementia  Toxic metabolic encephalopathy CT head without acute findings  Conjunctivitis  Moderate protein calorie malnutrition  Multiple pressure ulcers at presentation  Pressure Injury 05/03/19 Ankle Left Deep Tissue Injury - Purple or maroon localized area of discolored intact skin or blood-filled blister due to damage of underlying soft tissue from pressure and/or shear. (Active)  05/03/19 0045  Location: Ankle  Location Orientation: Left  Staging: Deep Tissue Injury - Purple or maroon localized area of discolored intact skin or blood-filled blister due to damage of  underlying soft tissue from pressure and/or shear.  Wound Description (Comments):   Present on Admission: Yes     Pressure Injury 05/03/19 Heel Right Deep Tissue Injury - Purple or maroon localized area of discolored intact skin or blood-filled blister due to damage of underlying soft tissue from pressure and/or shear. (Active)  05/03/19 0045  Location: Heel  Location Orientation: Right  Staging: Deep Tissue Injury - Purple or maroon localized area of discolored intact skin or blood-filled blister due to damage of underlying soft tissue from pressure and/or shear.  Wound Description (Comments):   Present on Admission: Yes     Pressure Injury 05/03/19 Sacrum Deep Tissue Injury - Purple or maroon localized area of discolored intact skin or blood-filled blister due to damage of underlying soft tissue from pressure and/or shear. (Active)  05/03/19 0045  Location: Sacrum  Location Orientation:   Staging: Deep Tissue Injury - Purple or maroon localized area of discolored intact skin or blood-filled blister due to damage of underlying soft tissue from pressure and/or shear.  Wound Description (Comments):   Present on Admission:      Pressure Injury 05/03/19 Buttocks Right;Circumferential Stage I -  Intact skin with non-blanchable redness of a localized area usually over a bony prominence. From brief elastic (Active)  05/03/19 0045  Location: Buttocks  Location Orientation: Right;Circumferential  Staging: Stage I -  Intact skin with non-blanchable redness of a localized area usually over a bony prominence.  Wound Description (Comments): From brief elastic  Present on Admission: Yes    DVT prophylaxis: comfort care only  Code Status: DNR - NO CODE Family Communication:  Disposition Plan: anticipate hospital death   Consultants:  none  Objective: Blood pressure 131/84, pulse 80,  temperature 98.9 F (37.2 C), temperature source Oral, resp. rate 10, height 5\' 4"  (1.626 m), weight 49.5 kg,  SpO2 100 %.  Intake/Output Summary (Last 24 hours) at 05/25/2019 0905 Last data filed at 05/25/2019 0400 Gross per 24 hour  Intake 817.08 ml  Output 150 ml  Net 667.08 ml   Filed Weights   05/23/19 0408 05/24/19 0500 05/25/19 0425  Weight: 59.4 kg 57.7 kg 49.5 kg    Examination: General: No acute respiratory distress Lungs: Mild bibasilar crackles Cardiovascular: Regular rate and rhythm     LOS: 22 days   Cherene Altes, MD [facility] Office  [phone] Pager - Text Page per Shea Evans  If 7PM-7AM, please contact night-coverage per Amion 05/25/2019, 9:05 AM

## 2019-05-26 NOTE — Progress Notes (Signed)
Family deferred update.

## 2019-05-26 NOTE — Progress Notes (Signed)
Called son Gerald Stabs) no answer, left nurses name and number.

## 2019-05-26 NOTE — Progress Notes (Signed)
Family update; spoke with patient's son.

## 2019-05-26 NOTE — Progress Notes (Addendum)
Tiffany Turner  [id number]:[id number] DOB: [birth year] DOA: 05/03/2019 PCP: Patient, No Pcp Per    Brief Narrative:  78 year old with a history of advanced dementia, HTN, frequent UTIs, and CAD who presented to the hospital with several days of increasing confusion.  Evaluation revealed sepsis related to a UTI with acute kidney injury.  She was incidentally found to be Covid positive as well.  Significant Events: 10/26 admit to [facility] with AMS -transfer to [facility]  COVID-19 specific Treatment: Decadron Remdesivir  Subjective: The patient is noncommunicative.  There is no evidence of respiratory distress or discomfort.  Assessment & Plan:  Terminal dementia - Comfort focused care Anticipate hospital death   Suspected Aspiration Pneumonia  Unable to safely be cleared for any consistent diet by SLP - clear high risk for recurrent aspiration with any oral intake -comfort feeds if desired but patient essentially obtunded at this time  COVID pneumonia  Completed treatment with remdesivir and dexamethasone  Proteus UTI  Completed 5-day course of antibiotics  Acute kidney injury Baseline creatinine 0.8 in October 2019 November 20  Hypernatremia  Due to dehydration  Hypokalemia Due to insufficient nutritional intake  Hypomagnesemia Due to insufficient nutritional intake  HTN  Advanced Alzheimer's dementia  Toxic metabolic encephalopathy CT head without acute findings  Conjunctivitis  Moderate protein calorie malnutrition  Multiple pressure ulcers at presentation  Pressure Injury 05/03/19 Ankle Left Deep Tissue Injury - Purple or maroon localized area of discolored intact skin or blood-filled blister due to damage of underlying soft tissue from pressure and/or shear. (Active)  05/03/19 0045  Location: Ankle  Location Orientation: Left  Staging: Deep Tissue Injury - Purple or maroon localized area of discolored intact skin or blood-filled blister due to damage of  underlying soft tissue from pressure and/or shear.  Wound Description (Comments):   Present on Admission: Yes     Pressure Injury 05/03/19 Heel Right Deep Tissue Injury - Purple or maroon localized area of discolored intact skin or blood-filled blister due to damage of underlying soft tissue from pressure and/or shear. (Active)  05/03/19 0045  Location: Heel  Location Orientation: Right  Staging: Deep Tissue Injury - Purple or maroon localized area of discolored intact skin or blood-filled blister due to damage of underlying soft tissue from pressure and/or shear.  Wound Description (Comments):   Present on Admission: Yes     Pressure Injury 05/03/19 Sacrum Deep Tissue Injury - Purple or maroon localized area of discolored intact skin or blood-filled blister due to damage of underlying soft tissue from pressure and/or shear. (Active)  05/03/19 0045  Location: Sacrum  Location Orientation:   Staging: Deep Tissue Injury - Purple or maroon localized area of discolored intact skin or blood-filled blister due to damage of underlying soft tissue from pressure and/or shear.  Wound Description (Comments):   Present on Admission:      Pressure Injury 05/03/19 Buttocks Right;Circumferential Stage I -  Intact skin with non-blanchable redness of a localized area usually over a bony prominence. From brief elastic (Active)  05/03/19 0045  Location: Buttocks  Location Orientation: Right;Circumferential  Staging: Stage I -  Intact skin with non-blanchable redness of a localized area usually over a bony prominence.  Wound Description (Comments): From brief elastic  Present on Admission: Yes    DVT prophylaxis: comfort care only  Code Status: DNR - NO CODE Family Communication:  Disposition Plan: investigate option of Hospice House placement to facilitate family visits    Consultants:  none  Objective:  Blood pressure (!) 145/106, pulse (!) 113, temperature 98.4 F (36.9 C), temperature source  Axillary, resp. rate (!) 22, height 5\' 4"  (1.626 m), weight 49.5 kg, SpO2 96 %.  Intake/Output Summary (Last 24 hours) at 05/26/2019 1405 Last data filed at 05/25/2019 2110 Gross per 24 hour  Intake 400 ml  Output 500 ml  Net -100 ml   Filed Weights   05/23/19 0408 05/24/19 0500 05/25/19 0425  Weight: 59.4 kg 57.7 kg 49.5 kg    Examination: General: No acute respiratory distress - unresponsive to touch  Lungs: Mild bibasilar crackles - no wheezing  Cardiovascular: RRR    LOS: 23 days   05/27/19, MD [facility] Office  [phone] Pager - Text Page per [phone]  If 7PM-7AM, please contact night-coverage per Amion 05/26/2019, 2:05 PM

## 2019-05-26 NOTE — Care Management Important Message (Signed)
Important Message  Patient Details  Name: Tiffany Turner MRN: [id number] Date of Birth: 1940-09-25   Medicare Important Message Given:  Yes - Important Message mailed due to current National Emergency   Verbal consent obtained due to current National Emergency  Relationship to patient: Child Contact Name: Karli Wickizer Call Date: 05/26/19  Time: 1453 Phone: [phone] Outcome: Spoke with contact Important Message mailed to: Other (must enter comment)([id number] [id number])     Orbie Pyo 05/26/2019, 2:56 PM

## 2019-05-27 NOTE — Progress Notes (Addendum)
Tiffany Turner  [id number]:[id number] DOB: 21-May-1941 DOA: 05/03/2019 PCP: Patient, No Pcp Per    Brief Narrative:  78 year old with a history of advanced dementia, HTN, frequent UTIs, and CAD who presented to the hospital with several days of increasing confusion.  Evaluation revealed sepsis related to a UTI with acute kidney injury.  She was incidentally found to be Covid positive as well.  Significant Events: 10/26 admit to [facility] with AMS -transfer to [facility]  COVID-19 specific Treatment: Decadron Remdesivir  Subjective: The patient is resting comfortably in bed.  She will open her eyes to my touch but does not interact with me in any way.  There is no evidence of respiratory distress or uncontrolled pain.  Assessment & Plan:  Terminal dementia - Comfort focused care Anticipate hospital death but wish to investigate the possibility of a hospice facility to facilitate visits with family   Suspected Aspiration Pneumonia  Unable to safely be cleared for any consistent diet by SLP - clear high risk for recurrent aspiration with any oral intake -comfort feeds if desired but patient essentially obtunded at this time  COVID pneumonia  Completed treatment with remdesivir and dexamethasone  Proteus UTI  Completed 5-day course of antibiotics  Acute kidney injury Baseline creatinine 0.8 in October 2019 November 20  Hypernatremia  Due to dehydration  Hypokalemia Due to insufficient nutritional intake  Hypomagnesemia Due to insufficient nutritional intake  HTN  Advanced Alzheimer's dementia  Toxic metabolic encephalopathy CT head without acute findings  Conjunctivitis  Moderate protein calorie malnutrition  Multiple pressure ulcers at presentation  Pressure Injury 05/03/19 Ankle Left Deep Tissue Injury - Purple or maroon localized area of discolored intact skin or blood-filled blister due to damage of underlying soft tissue from pressure and/or shear. (Active)   05/03/19 0045  Location: Ankle  Location Orientation: Left  Staging: Deep Tissue Injury - Purple or maroon localized area of discolored intact skin or blood-filled blister due to damage of underlying soft tissue from pressure and/or shear.  Wound Description (Comments):   Present on Admission: Yes     Pressure Injury 05/03/19 Heel Right Deep Tissue Injury - Purple or maroon localized area of discolored intact skin or blood-filled blister due to damage of underlying soft tissue from pressure and/or shear. (Active)  05/03/19 0045  Location: Heel  Location Orientation: Right  Staging: Deep Tissue Injury - Purple or maroon localized area of discolored intact skin or blood-filled blister due to damage of underlying soft tissue from pressure and/or shear.  Wound Description (Comments):   Present on Admission: Yes     Pressure Injury 05/03/19 Sacrum Deep Tissue Injury - Purple or maroon localized area of discolored intact skin or blood-filled blister due to damage of underlying soft tissue from pressure and/or shear. (Active)  05/03/19 0045  Location: Sacrum  Location Orientation:   Staging: Deep Tissue Injury - Purple or maroon localized area of discolored intact skin or blood-filled blister due to damage of underlying soft tissue from pressure and/or shear.  Wound Description (Comments):   Present on Admission:      Pressure Injury 05/03/19 Buttocks Right;Circumferential Stage I -  Intact skin with non-blanchable redness of a localized area usually over a bony prominence. From brief elastic (Active)  05/03/19 0045  Location: Buttocks  Location Orientation: Right;Circumferential  Staging: Stage I -  Intact skin with non-blanchable redness of a localized area usually over a bony prominence.  Wound Description (Comments): From brief elastic  Present on Admission: Yes  DVT prophylaxis: comfort care only  Code Status: DNR - NO CODE Family Communication:  Disposition Plan: investigate  option of Hospice House placement to facilitate family visits    Consultants:  none  Objective: Blood pressure (!) 145/106, pulse (!) 113, temperature 98.4 F (36.9 C), temperature source Axillary, resp. rate (!) 22, height 5\' 4"  (1.626 m), weight 49.5 kg, SpO2 96 %.  Intake/Output Summary (Last 24 hours) at 05/27/2019 05/29/2019 Last data filed at 05/27/2019 0600 Gross per 24 hour  Intake -  Output 300 ml  Net -300 ml   Filed Weights   05/23/19 0408 05/24/19 0500 05/25/19 0425  Weight: 59.4 kg 57.7 kg 49.5 kg    Examination: General: No acute respiratory distress - opens eyes to touch  Lungs: no wheezing  Cardiovascular: RRR    LOS: 24 days   05/27/19, MD [facility] Office  [phone] Pager - Text Page per [phone]  If 7PM-7AM, please contact night-coverage per Amion 05/27/2019, 8:23 AM

## 2019-05-27 NOTE — Progress Notes (Signed)
Pt son Tiffany Turner updated this afternoon. No changes at this time. Gave morphine 2mg  IV at this time. Pt remains comfortable throughout shift but w/ any touch/movement pt expresses much discomfort.   **Comfort care.  Hourly safety rounds complete

## 2019-05-27 NOTE — Progress Notes (Signed)
Called family no answer left call back number. 

## 2019-05-28 NOTE — Progress Notes (Signed)
Talishia Betzler Wafer  [id number] DOB: Nov 05, 1940 DOA: 05/03/2019 PCP: Patient, No Pcp Per    Brief Narrative:  78 year old with a history of advanced dementia, HTN, frequent UTIs, and CAD who presented to the hospital with several days of increasing confusion.  Evaluation revealed sepsis related to a UTI with acute kidney injury.  She was incidentally found to be Covid positive as well.  Significant Events: 10/26 admit to [facility] with AMS - transfer to [facility]  COVID-19 specific Treatment: Decadron Remdesivir  Subjective: Patient does not appear to be uncomfortable.  Today she will open her eyes but does not interact with me.  There is no evidence of respiratory distress.  Assessment & Plan:  Terminal dementia - Comfort focused care Anticipate hospital death but wish to investigate the possibility of a hospice facility to facilitate visits with family - social work consult placed previously has been escalated to a STAT request today   Suspected Aspiration Pneumonia  Unable to safely be cleared for any consistent diet by SLP - clear high risk for recurrent aspiration with any oral intake - comfort feeds if desired but patient essentially obtunded at this time  COVID pneumonia  Completed treatment with remdesivir and dexamethasone  Proteus UTI  Completed 5-day course of antibiotics  Acute kidney injury Baseline creatinine 0.8 in October 2019 November 20  Hypernatremia  Due to dehydration  Hypokalemia Due to insufficient nutritional intake  Hypomagnesemia Due to insufficient nutritional intake  HTN  Advanced Alzheimer's dementia  Toxic metabolic encephalopathy CT head without acute findings  Conjunctivitis  Moderate protein calorie malnutrition  Multiple pressure ulcers at presentation  Pressure Injury 05/03/19 Ankle Left Deep Tissue Injury - Purple or maroon localized area of discolored intact skin or blood-filled blister due to damage of underlying soft  tissue from pressure and/or shear. (Active)  05/03/19 0045  Location: Ankle  Location Orientation: Left  Staging: Deep Tissue Injury - Purple or maroon localized area of discolored intact skin or blood-filled blister due to damage of underlying soft tissue from pressure and/or shear.  Wound Description (Comments):   Present on Admission: Yes     Pressure Injury 05/03/19 Heel Right Deep Tissue Injury - Purple or maroon localized area of discolored intact skin or blood-filled blister due to damage of underlying soft tissue from pressure and/or shear. (Active)  05/03/19 0045  Location: Heel  Location Orientation: Right  Staging: Deep Tissue Injury - Purple or maroon localized area of discolored intact skin or blood-filled blister due to damage of underlying soft tissue from pressure and/or shear.  Wound Description (Comments):   Present on Admission: Yes     Pressure Injury 05/03/19 Sacrum Deep Tissue Injury - Purple or maroon localized area of discolored intact skin or blood-filled blister due to damage of underlying soft tissue from pressure and/or shear. (Active)  05/03/19 0045  Location: Sacrum  Location Orientation:   Staging: Deep Tissue Injury - Purple or maroon localized area of discolored intact skin or blood-filled blister due to damage of underlying soft tissue from pressure and/or shear.  Wound Description (Comments):   Present on Admission:      Pressure Injury 05/03/19 Buttocks Right;Circumferential Stage I -  Intact skin with non-blanchable redness of a localized area usually over a bony prominence. From brief elastic (Active)  05/03/19 0045  Location: Buttocks  Location Orientation: Right;Circumferential  Staging: Stage I -  Intact skin with non-blanchable redness of a localized area usually over a bony prominence.  Wound Description (Comments): From  brief elastic  Present on Admission: Yes    DVT prophylaxis: comfort care only  Code Status: DNR - NO CODE Family  Communication:  Disposition Plan: investigate option of Hospice House placement to facilitate family visits    Consultants:  none  Objective: Blood pressure (!) 145/73, pulse (!) 120, temperature 99.2 F (37.3 C), temperature source Axillary, resp. rate (!) 22, height 5\' 4"  (1.626 m), weight 49.5 kg, SpO2 93 %. No intake or output data in the 24 hours ending 05/28/19 0901 Filed Weights   05/23/19 0408 05/24/19 0500 05/25/19 0425  Weight: 59.4 kg 57.7 kg 49.5 kg    Examination: General: NAD Lungs: no wheezing  Cardiovascular: RRR    LOS: 25 days   Cherene Altes, MD [facility] Office  [phone] Pager - Text Page per Shea Evans  If 7PM-7AM, please contact night-coverage per Amion 05/28/2019, 9:01 AM

## 2019-05-28 NOTE — Progress Notes (Signed)
Pt son updated twice today. Pt was given IV ativan 2mg  & IV morphine 2mg  day shift 11/21. B/L heel/ankle foam dressings changed, healing well (see flow sheet for details) Sacral foam dressing replaced. Air boots remain in place Hourly safety checks Purewick in place. Pt turned in bed. Pt given a complete bath today, hair washed, bed pads changed. Powder & incontinence ointment applied to perineum/buttock. **frequent mouth swabs

## 2019-05-29 NOTE — Progress Notes (Addendum)
Niyanna Asch Kuznia  [id number] DOB: 05-28-1941 DOA: 05/03/2019 PCP: Patient, No Pcp Per    Brief Narrative:  78 year old with a history of advanced dementia, HTN, frequent UTIs, and CAD who presented to the hospital with several days of increasing confusion.  Evaluation revealed sepsis related to a UTI with acute kidney injury.  She was incidentally found to be Covid positive as well.  Significant Events: 10/26 admit to [facility] with AMS - transfer to [facility]  COVID-19 specific Treatment: Decadron Remdesivir  Subjective: Resting comfortably at the time of my visit. Opens her eyes to my touch. Evidence of discomfort or respiratory distress.  Assessment & Plan:  Terminal dementia - Comfort focused care wish to investigate the possibility of a hospice facility to facilitate visits with family - social work consult placed previously escalated to a STAT request 11/21   Suspected Aspiration Pneumonia  Unable to safely be cleared for any consistent diet by SLP - clear high risk for recurrent aspiration with any oral intake - comfort feeds if desired but patient essentially obtunded at this time  COVID pneumonia  Completed treatment with remdesivir and dexamethasone  Proteus UTI  Completed 5-day course of antibiotics  Acute kidney injury Baseline creatinine 0.8 in October 2019 November 20  Hypernatremia  Due to dehydration  Hypokalemia Due to insufficient nutritional intake  Hypomagnesemia Due to insufficient nutritional intake  HTN  Advanced Alzheimer's dementia  Toxic metabolic encephalopathy CT head without acute findings  Conjunctivitis  Moderate protein calorie malnutrition  Multiple pressure ulcers at presentation  Pressure Injury 05/03/19 Ankle Left Deep Tissue Injury - Purple or maroon localized area of discolored intact skin or blood-filled blister due to damage of underlying soft tissue from pressure and/or shear. (Active)  05/03/19 0045  Location:  Ankle  Location Orientation: Left  Staging: Deep Tissue Injury - Purple or maroon localized area of discolored intact skin or blood-filled blister due to damage of underlying soft tissue from pressure and/or shear.  Wound Description (Comments):   Present on Admission: Yes     Pressure Injury 05/03/19 Heel Right Deep Tissue Injury - Purple or maroon localized area of discolored intact skin or blood-filled blister due to damage of underlying soft tissue from pressure and/or shear. (Active)  05/03/19 0045  Location: Heel  Location Orientation: Right  Staging: Deep Tissue Injury - Purple or maroon localized area of discolored intact skin or blood-filled blister due to damage of underlying soft tissue from pressure and/or shear.  Wound Description (Comments):   Present on Admission: Yes     Pressure Injury 05/03/19 Sacrum Deep Tissue Injury - Purple or maroon localized area of discolored intact skin or blood-filled blister due to damage of underlying soft tissue from pressure and/or shear. (Active)  05/03/19 0045  Location: Sacrum  Location Orientation:   Staging: Deep Tissue Injury - Purple or maroon localized area of discolored intact skin or blood-filled blister due to damage of underlying soft tissue from pressure and/or shear.  Wound Description (Comments):   Present on Admission:      Pressure Injury 05/03/19 Buttocks Right;Circumferential Stage I -  Intact skin with non-blanchable redness of a localized area usually over a bony prominence. From brief elastic (Active)  05/03/19 0045  Location: Buttocks  Location Orientation: Right;Circumferential  Staging: Stage I -  Intact skin with non-blanchable redness of a localized area usually over a bony prominence.  Wound Description (Comments): From brief elastic  Present on Admission: Yes    DVT prophylaxis: comfort care  only  Code Status: DNR - NO CODE Family Communication: call placed to son's listed number - no answer Disposition  Plan: investigate option of Hospice House placement to facilitate family visits    Consultants:  none  Objective: Blood pressure 126/63, pulse 99, temperature 98.7 F (37.1 C), temperature source Oral, resp. rate (!) 28, height 5\' 4"  (1.626 m), weight 49.5 kg, SpO2 93 %.  Intake/Output Summary (Last 24 hours) at 05/29/2019 0818 Last data filed at 05/29/2019 0600 Gross per 24 hour  Intake 0 ml  Output 250 ml  Net -250 ml   Filed Weights   05/23/19 0408 05/24/19 0500 05/25/19 0425  Weight: 59.4 kg 57.7 kg 49.5 kg    Examination: General: NAD Lungs: no distress    LOS: 26 days   Cherene Altes, MD [facility] Office  [phone] Pager - Text Page per Shea Evans  If 7PM-7AM, please contact night-coverage per Amion 05/29/2019, 8:18 AM

## 2019-05-29 NOTE — Progress Notes (Signed)
Pt son Gerald Stabs updated x2 today. Spoke w/ MD [place] about the possibility of pt going to a hospice center. TBD-depending on bed availability. Requested to retest pt to see if she is now negative to give her more options?

## 2019-05-29 NOTE — Progress Notes (Signed)
Phone attempt to son Tiffany Turner [phone], no answer, and "voice mailbox has not been set up at this time".

## 2019-05-30 NOTE — Progress Notes (Signed)
Son Gerald Stabs returned phone call, is requesting to speak to attending MD directly tomorrow regarding pt condition and disposition of hospice facility vs [facility]. No further questions at this time. Will relay information to day team, please continue to provide updates.

## 2019-05-30 NOTE — Clinical Social Work Note (Signed)
CSW spoke with pt's son regarding residential hospice. Pt's son said no he does not want her going to [facility] as they do not live in [place]. MD updated.   [place], [place]

## 2019-05-30 NOTE — Progress Notes (Signed)
COVID test collected and sent. 

## 2019-05-30 NOTE — Plan of Care (Signed)
  Problem: Coping: Goal: Psychosocial and spiritual needs will be supported Outcome: Progressing PRN ATIVAN FOR ANXIETY MUSIC/REST/RELAXATION PROVIDED    Problem: Respiratory: Goal: Will maintain a patent airway Outcome: Progressing ROOM AIR   Problem: Pain Managment: Goal: General experience of comfort will improve Outcome: Progressing  PRN MORPHINE FOR PAIN CONTROL  Problem: Nutrition: Goal: Adequate nutrition will be maintained Outcome: Not Progressing TOO LETHARGIC FOR ORAL INTAKE  Problem: Skin Integrity: Goal: Risk for impaired skin integrity will decrease Outcome: Not Progressing NONHEALING SACRAL WOUND. Q2H TURN/REPOSITION

## 2019-05-30 NOTE — Progress Notes (Signed)
Tiffany Turner  [id number] DOB: [birth year] DOA: 05/03/2019 PCP: Patient, No Pcp Per    Brief Narrative:  78 year old with a history of advanced dementia, HTN, frequent UTIs, and CAD who presented to the hospital with several days of increasing confusion.  Evaluation revealed sepsis related to a UTI with acute kidney injury.  She was incidentally found to be Covid positive as well.  Significant Events: 10/26 admit to [facility] with AMS 10/27 transfer to [facility] 10/29 SARS-CoV2 test +  COVID-19 specific Treatment: Decadron 10/27 > 11/5 Remdesivir 10/27 > 10/31  Subjective: Resting comfortably at the time of my visit without evidence of respiratory distress or uncontrolled pain.  Assessment & Plan:  Terminal dementia - Comfort focused care Repeat COVID test pending in attempts to facilitate transfer to a hospice facility   Suspected Aspiration Pneumonia  Unable to safely be cleared for any consistent diet by SLP - clear high risk for recurrent aspiration with any oral intake - comfort feeds if desired but patient essentially obtunded at this time  COVID pneumonia  Completed treatment with remdesivir and dexamethasone  Proteus UTI  Completed 5-day course of antibiotics - afebrile   Acute kidney injury Baseline creatinine 0.8 in October 2019 November 20  Hypernatremia  Due to dehydration  Hypokalemia Due to insufficient nutritional intake  Hypomagnesemia Due to insufficient nutritional intake  HTN  Advanced Alzheimer's dementia  Toxic metabolic encephalopathy CT head without acute findings  Conjunctivitis  Moderate protein calorie malnutrition  Multiple pressure ulcers at presentation  Pressure Injury 05/03/19 Ankle Left Deep Tissue Injury - Purple or maroon localized area of discolored intact skin or blood-filled blister due to damage of underlying soft tissue from pressure and/or shear. (Active)  05/03/19 0045  Location: Ankle  Location Orientation:  Left  Staging: Deep Tissue Injury - Purple or maroon localized area of discolored intact skin or blood-filled blister due to damage of underlying soft tissue from pressure and/or shear.  Wound Description (Comments):   Present on Admission: Yes     Pressure Injury 05/03/19 Heel Right Deep Tissue Injury - Purple or maroon localized area of discolored intact skin or blood-filled blister due to damage of underlying soft tissue from pressure and/or shear. (Active)  05/03/19 0045  Location: Heel  Location Orientation: Right  Staging: Deep Tissue Injury - Purple or maroon localized area of discolored intact skin or blood-filled blister due to damage of underlying soft tissue from pressure and/or shear.  Wound Description (Comments):   Present on Admission: Yes     Pressure Injury 05/03/19 Sacrum Deep Tissue Injury - Purple or maroon localized area of discolored intact skin or blood-filled blister due to damage of underlying soft tissue from pressure and/or shear. (Active)  05/03/19 0045  Location: Sacrum  Location Orientation:   Staging: Deep Tissue Injury - Purple or maroon localized area of discolored intact skin or blood-filled blister due to damage of underlying soft tissue from pressure and/or shear.  Wound Description (Comments):   Present on Admission:      Pressure Injury 05/03/19 Buttocks Right;Circumferential Stage I -  Intact skin with non-blanchable redness of a localized area usually over a bony prominence. From brief elastic (Active)  05/03/19 0045  Location: Buttocks  Location Orientation: Right;Circumferential  Staging: Stage I -  Intact skin with non-blanchable redness of a localized area usually over a bony prominence.  Wound Description (Comments): From brief elastic  Present on Admission: Yes    DVT prophylaxis: comfort care only  Code Status:  DNR - NO CODE Family Communication:  Disposition Plan: investigate option of Hospice House placement to facilitate family visits     Consultants:  none  Objective: Blood pressure (!) 147/67, pulse (!) 112, temperature 98 F (36.7 C), temperature source Oral, resp. rate 20, height 5\' 4"  (1.626 m), weight 49.5 kg, SpO2 93 %.  Intake/Output Summary (Last 24 hours) at 05/30/2019 0911 Last data filed at 05/30/2019 0000 Gross per 24 hour  Intake 5 ml  Output 300 ml  Net -295 ml   Filed Weights   05/23/19 0408 05/24/19 0500 05/25/19 0425  Weight: 59.4 kg 57.7 kg 49.5 kg    Examination: General: NAD Lungs: no distress    LOS: 27 days   05/27/19, MD [facility] Office  [phone] Pager - Text Page per [phone]  If 7PM-7AM, please contact night-coverage per Amion 05/30/2019, 9:11 AM

## 2019-05-30 NOTE — Progress Notes (Signed)
   05/30/19 0700  Provider Notification  Provider Name/Title Joette Catching  Date Provider Notified 05/30/19  Time Provider Notified [phone]  Notification Type Page  Notification Reason Requested by patient/family (son Gerald Stabs requesting MD phone call )

## 2019-05-31 LAB — SARS CORONAVIRUS 2 (TAT 6-24 HRS): SARS Coronavirus 2: NEGATIVE

## 2019-05-31 MED ORDER — ONDANSETRON HCL 4 MG/2ML IJ SOLN: 4.0000 mg | Freq: Four times a day (QID) | INTRAMUSCULAR | 0 refills | Status: AC | PRN

## 2019-05-31 MED ORDER — MORPHINE SULFATE (PF) 2 MG/ML IV SOLN: 1.0000 mg | INTRAVENOUS | 0 refills | Status: AC | PRN

## 2019-05-31 MED ORDER — LORAZEPAM 2 MG/ML IJ SOLN: 1.0000 mg | INTRAMUSCULAR | 0 refills | Status: AC | PRN

## 2019-05-31 MED ORDER — POLYVINYL ALCOHOL 1.4 % OP SOLN: 1.0000 [drp] | Freq: Four times a day (QID) | OPHTHALMIC | 0 refills | Status: AC | PRN

## 2019-05-31 MED ORDER — GLYCOPYRROLATE 0.2 MG/ML IJ SOLN: 0.2000 mg | INTRAMUSCULAR | Status: AC | PRN

## 2019-05-31 MED ORDER — ORAL CARE MOUTH RINSE: 15.0000 mL | Freq: Two times a day (BID) | OROMUCOSAL | 0 refills | Status: AC

## 2019-05-31 MED ORDER — ACETAMINOPHEN 325 MG PO TABS: 650.0000 mg | ORAL_TABLET | Freq: Four times a day (QID) | ORAL | Status: AC | PRN

## 2019-05-31 MED ORDER — HYDROCOD POLST-CPM POLST ER 10-8 MG/5ML PO SUER: 5.0000 mL | Freq: Two times a day (BID) | ORAL | Status: AC | PRN

## 2019-05-31 MED ORDER — GLYCOPYRROLATE 1 MG PO TABS: 1.0000 mg | ORAL_TABLET | ORAL | Status: AC | PRN

## 2019-05-31 MED ORDER — BIOTENE DRY MOUTH MT LIQD: 15.0000 mL | OROMUCOSAL | Status: AC | PRN

## 2019-05-31 MED ORDER — SODIUM CHLORIDE 0.9% FLUSH: 10.0000 mL | INTRAVENOUS | Status: AC | PRN

## 2019-05-31 MED ORDER — ONDANSETRON 4 MG PO TBDP: 4.0000 mg | ORAL_TABLET | Freq: Four times a day (QID) | ORAL | 0 refills | Status: AC | PRN

## 2019-05-31 MED ORDER — METOPROLOL TARTRATE 5 MG/5ML IV SOLN: 5.0000 mg | INTRAVENOUS | Status: AC | PRN

## 2019-05-31 MED ORDER — LIP MEDEX EX OINT: TOPICAL_OINTMENT | CUTANEOUS | 0 refills | Status: AC | PRN

## 2019-05-31 MED ORDER — ACETAMINOPHEN 650 MG RE SUPP: 650.0000 mg | Freq: Four times a day (QID) | RECTAL | 0 refills | Status: AC | PRN

## 2019-05-31 NOTE — Progress Notes (Signed)
Turn pt q2h, provided all wound and mouth care. Updated son on pt's status. Notified MD via secure chat awaiting for results from test.

## 2019-05-31 NOTE — Plan of Care (Signed)
  Problem: Education: Goal: Knowledge of risk factors and measures for prevention of condition will improve Outcome: Progressing   Problem: Coping: Goal: Psychosocial and spiritual needs will be supported Outcome: Progressing   Problem: Respiratory: Goal: Will maintain a patent airway Outcome: Progressing Goal: Complications related to the disease process, condition or treatment will be avoided or minimized Outcome: Progressing   Problem: Health Behavior/Discharge Planning: Goal: Ability to manage health-related needs will improve Outcome: Progressing   Problem: Clinical Measurements: Goal: Ability to maintain clinical measurements within normal limits will improve Outcome: Progressing Goal: Will remain free from infection Outcome: Progressing Goal: Diagnostic test results will improve Outcome: Progressing Goal: Respiratory complications will improve Outcome: Progressing Goal: Cardiovascular complication will be avoided Outcome: Progressing   Problem: Activity: Goal: Risk for activity intolerance will decrease Outcome: Progressing   Problem: Nutrition: Goal: Adequate nutrition will be maintained Outcome: Progressing   Problem: Coping: Goal: Level of anxiety will decrease Outcome: Progressing   Problem: Pain Managment: Goal: General experience of comfort will improve Outcome: Progressing   Problem: Safety: Goal: Ability to remain free from injury will improve Outcome: Progressing   Problem: Skin Integrity: Goal: Risk for impaired skin integrity will decrease Outcome: Progressing   Problem: Urinary Elimination: Goal: Signs and symptoms of infection will decrease Outcome: Progressing

## 2019-05-31 NOTE — Progress Notes (Addendum)
Spoke with son, Gerald Stabs, and update given.

## 2019-05-31 NOTE — TOC Progression Note (Addendum)
Transition of Care [facility]) - Progression Note    Patient Details  Name: Tiffany Turner MRN: [id number] Date of Birth: May 30, 1941  Transition of Care [facility]) CM/SW Contact  Loletha Grayer Beverely Pace, RN Phone Number: 05/31/2019, 2:59 PM  Clinical Narrative:   Case manager called [facility] to see if patient could possibly transfer there, they are not accepting patients that are not already connected with [facility]. Patient has negative COVID test as of today, she is 21 days out from her positive test. Son would like his mom somewhere that he can be with her as she transitions.   1513: Case manager notified by MD.that patient will be transferred to [facility] via [place] when bed available. Son hopefully will be able to spend some time with his mom before she passed.  .    Expected Discharge Plan: Skilled Nursing Facility Barriers to Discharge: Continued Medical Work up  Expected Discharge Plan and Services Expected Discharge Plan: [place] In-house Referral: Clinical Social Work     Living arrangements for the past 2 months: [place]                                       Social Determinants of Health (SDOH) Interventions    Readmission Risk Interventions Readmission Risk Prevention Plan 05/06/2019 05/01/2018  Transportation Screening Complete Complete  PCP or Specialist Appt within 3-5 Days - Complete  Home Care Screening - Complete  Social Work Consult for [place] Planning/Counseling - Complete  Palliative Care Screening - Complete  Medication Review Press photographer) Complete Complete  HRI or [place] Not Complete -  [place] or Home Care Consult Pt Refusal Comments Pt from SNF -  [place] Complete -  Some recent data might be hidden

## 2019-05-31 NOTE — Progress Notes (Signed)
Tiffany Turner  VVO:[id number] DOB: 1940/11/27 DOA: 05/03/2019 PCP: Patient, No Pcp Per    Brief Narrative:  78 year old with a history of advanced dementia, HTN, frequent UTIs, and CAD who presented to the hospital with several days of increasing confusion.  Evaluation revealed sepsis related to a UTI with acute kidney injury.  She was incidentally found to be Covid positive as well.  Significant Events: 10/26 admit to [facility] with AMS 10/27 transfer to [facility] 10/29 SARS-CoV2 test +  COVID-19 specific Treatment: Decadron 10/27 > 11/5 Remdesivir 10/27 > 10/31  Subjective: Resting comfortably.  No evidence of uncontrolled pain or respiratory distress.  Does not respond to my touch.  Assessment & Plan:  Terminal dementia - Comfort focused care Repeat COVID test pending in attempt to facilitate transfer to a hospice facility, or [facility] to allow her son to visit her   Suspected Aspiration Pneumonia  Unable to safely be cleared for any consistent diet by SLP - clear high risk for recurrent aspiration with any oral intake - comfort feeds if desired but patient essentially obtunded at this time  COVID pneumonia  Completed treatment with remdesivir and dexamethasone - could technically come off isolation at this time as she is >21 days out - if her SARS f/u test is negative will pursue Hospice House placement - if it is positive, will attempt to transfer to [facility] without need for isolation   Proteus UTI  Completed 5-day course of antibiotics - afebrile   Acute kidney injury Baseline creatinine 0.8 in October 2019 November 20  Hypernatremia  Due to dehydration  Hypokalemia Due to insufficient nutritional intake  Hypomagnesemia Due to insufficient nutritional intake  HTN  Advanced Alzheimer's dementia  Toxic metabolic encephalopathy CT head without acute findings  Moderate protein calorie malnutrition  Multiple pressure ulcers at presentation  Pressure Injury  05/03/19 Ankle Left Deep Tissue Injury - Purple or maroon localized area of discolored intact skin or blood-filled blister due to damage of underlying soft tissue from pressure and/or shear. (Active)  05/03/19 0045  Location: Ankle  Location Orientation: Left  Staging: Deep Tissue Injury - Purple or maroon localized area of discolored intact skin or blood-filled blister due to damage of underlying soft tissue from pressure and/or shear.  Wound Description (Comments):   Present on Admission: Yes     Pressure Injury 05/03/19 Heel Right Deep Tissue Injury - Purple or maroon localized area of discolored intact skin or blood-filled blister due to damage of underlying soft tissue from pressure and/or shear. (Active)  05/03/19 0045  Location: Heel  Location Orientation: Right  Staging: Deep Tissue Injury - Purple or maroon localized area of discolored intact skin or blood-filled blister due to damage of underlying soft tissue from pressure and/or shear.  Wound Description (Comments):   Present on Admission: Yes     Pressure Injury 05/03/19 Sacrum Deep Tissue Injury - Purple or maroon localized area of discolored intact skin or blood-filled blister due to damage of underlying soft tissue from pressure and/or shear. (Active)  05/03/19 0045  Location: Sacrum  Location Orientation:   Staging: Deep Tissue Injury - Purple or maroon localized area of discolored intact skin or blood-filled blister due to damage of underlying soft tissue from pressure and/or shear.  Wound Description (Comments):   Present on Admission:      Pressure Injury 05/03/19 Buttocks Right;Circumferential Stage I -  Intact skin with non-blanchable redness of a localized area usually over a bony prominence. From brief elastic (Active)  05/03/19 0045  Location: Buttocks  Location Orientation: Right;Circumferential  Staging: Stage I -  Intact skin with non-blanchable redness of a localized area usually over a bony prominence.  Wound  Description (Comments): From brief elastic  Present on Admission: Yes    DVT prophylaxis: comfort care only  Code Status: DNR - NO CODE Family Communication:  Disposition Plan: investigate option of Hospice House placement to facilitate family visits    Consultants:  none  Objective: Blood pressure (!) 170/83, pulse (!) 102, temperature 98.4 F (36.9 C), temperature source Axillary, resp. rate 18, height 5\' 4"  (1.626 m), weight 49.5 kg, SpO2 92 %.  Intake/Output Summary (Last 24 hours) at 05/31/2019 0957 Last data filed at 05/31/2019 0400 Gross per 24 hour  Intake -  Output 450 ml  Net -450 ml   Filed Weights   05/23/19 0408 05/24/19 0500 05/25/19 0425  Weight: 59.4 kg 57.7 kg 49.5 kg    Examination: General: NAD evident Lungs: no distress -no wheezing    LOS: 28 days   Cherene Altes, MD [facility] Office  [phone] Pager - Text Page per Shea Evans  If 7PM-7AM, please contact night-coverage per Amion 05/31/2019, 9:57 AM

## 2019-05-31 NOTE — Discharge Summary (Signed)
DISCHARGE SUMMARY  Tiffany FinnerJosephine M Turner  MR#: [id number]  DOB:08/25/1940  Date of Admission: 05/03/2019 Date of Discharge: 05/31/2019  Attending Physician:Belva Koziel Silvestre Gunner Masashi Snowdon, MD  Patient's MVH:QIONGEXPCP:Patient, No Pcp Per  Consults: none  Disposition: [facility] to allow her son to visit her   Date of Positive COVID Test: 10/29  Date Quarantine Ends: 05/26/2019  THIS PATIENT NO LONGER REQUIRES [place] OR RESPIRATORY ISOLATION. SHE DOES NOT NEED TO BE IN QUARANTINE. SHE HAS COMPLETED OVER 21 DAYS OF ISOLATION SINCE HER FIRST POSITIVE COVID TEST, AND SHE ALSO HAD A NEGATIVE F/U COVID TEST 11/23. (obtained in attempt to place her in a hospice facility)  COVID-19 specific Treatment: Decadron 10/27 > 11/5 Remdesivir 10/27 > 10/31  Discharge Diagnoses: Advanced Alzheimer's dementia / Terminal dementia Comfort focused care Suspected Aspiration Pneumonia  COVID pneumonia  Proteus UTI  Acute kidney injury Hypernatremia  Hypokalemia Hypomagnesemia HTN Toxic metabolic encephalopathy Moderate protein calorie malnutrition Multiple pressure ulcers at presentation  NO CODE BLUE - DNR   Initial presentation: 78 year old with a history of advanced dementia, HTN, frequent UTIs, and CAD who presented to the hospital with several days of increasing confusion.  Evaluation revealed sepsis related to a UTI with acute kidney injury.  She was incidentally found to be Covid positive as well.  Hospital Course: Ms. Tiffany Turner [facility] ED with altered mental status.  She was diagnosed with a Proteus UTI, and also found to be Covid positive.  [facility] and underwent a treatment course for possible Covid pneumonia.  She completed a full course of remdesivir and a full course of Decadron.  Unfortunately she did not improve in regard to her altered mental status.  It was felt that any acute component of Covid was resolved but that her ongoing delirium was related  primarily to her advanced dementia.  She was noted to be aspirating and it was felt that some of her respiratory issues were due to aspiration pneumonitis.  Her hospital stay was further complicated by acute kidney injury, hypernatremia, hypokalemia, and a malnourished state.  It was felt that her state represented terminal dementia and a loss of interest and loss of ability to consume or process nutrition.  Ultimately it was felt the transitioning to a comfort focused approach was most appropriate, and the patient's family agreed with this.    The latter portion of [facility] stay [facility] was unremarkable.  She appeared to be resting comfortably each day when seen by the physician, and was closely attended to by the nursing staff.  Given that she was approaching death, given that she had reached the point she no longer required isolation from the standpoint of her prior Covid infection, and given her son strongly desired to visit her prior to her death an attempt was made to place her in a hospice facility [place].  Unfortunately the hospice facility there had no available beds.  As a result the patient is being transferred to a medical bed at [facility] in order for her to be closer to her son and also in a facility where family visitation is safer to facilitate.  The active issues that were addressed during her hospital stay are as follows:  Terminal dementia - Comfort focused care  Suspected Aspiration Pneumonia  Unable to safely be cleared for any consistent diet by SLP - clear high risk for recurrent aspiration with any oral intake - comfort feeds if desired but patient essentially  obtunded at this time  COVID pneumonia  Completed treatment with remdesivir and dexamethasone - no longer needs isolation at this time as she is >21 days out - her f/u SARS test was negative 11/23   Proteus UTI  Completed 5-day course of antibiotics - afebrile   Acute kidney injury Baseline  creatinine 0.8 in October 2019 November 20  Hypernatremia  Due to dehydration  Hypokalemia Due to insufficient nutritional intake  Hypomagnesemia Due to insufficient nutritional intake  HTN  Advanced Alzheimer's dementia  Toxic metabolic encephalopathy CT head without acute findings  Moderate protein calorie malnutrition  Multiple pressure ulcers at presentation  Pressure Injury 05/03/19 Ankle Left Deep Tissue Injury - Purple or maroon localized area of discolored intact skin or blood-filled blister due to damage of underlying soft tissue from pressure and/or shear. (Active)  05/03/19 0045  Location: Ankle  Location Orientation: Left  Staging: Deep Tissue Injury - Purple or maroon localized area of discolored intact skin or blood-filled blister due to damage of underlying soft tissue from pressure and/or shear.  Wound Description (Comments):   Present on Admission: Yes     Pressure Injury 05/03/19 Heel Right Deep Tissue Injury - Purple or maroon localized area of discolored intact skin or blood-filled blister due to damage of underlying soft tissue from pressure and/or shear. (Active)  05/03/19 0045  Location: Heel  Location Orientation: Right  Staging: Deep Tissue Injury - Purple or maroon localized area of discolored intact skin or blood-filled blister due to damage of underlying soft tissue from pressure and/or shear.  Wound Description (Comments):   Present on Admission: Yes     Pressure Injury 05/03/19 Sacrum Deep Tissue Injury - Purple or maroon localized area of discolored intact skin or blood-filled blister due to damage of underlying soft tissue from pressure and/or shear. (Active)  05/03/19 0045  Location: Sacrum  Location Orientation:   Staging: Deep Tissue Injury - Purple or maroon localized area of discolored intact skin or blood-filled blister due to damage of underlying soft tissue from pressure and/or shear.  Wound Description (Comments):   Present  on Admission:      Pressure Injury 05/03/19 Buttocks Right;Circumferential Stage I -  Intact skin with non-blanchable redness of a localized area usually over a bony prominence. From brief elastic (Active)  05/03/19 0045  Location: Buttocks  Location Orientation: Right;Circumferential  Staging: Stage I -  Intact skin with non-blanchable redness of a localized area usually over a bony prominence.  Wound Description (Comments): From brief elastic  Present on Admission: Yes       Allergies as of 05/31/2019      Reactions   Penicillins Other (See Comments)   Has patient had a PCN reaction causing immediate rash, facial/tongue/throat swelling, SOB or lightheadedness with hypotension: Unknown Has patient had a PCN reaction causing severe rash involving mucus membranes or skin necrosis: Unknown Has patient had a PCN reaction that required hospitalization: Unknown Has patient had a PCN reaction occurring within the last 10 years: Unknown If all of the above answers are "NO", then may proceed with Cephalosporin use. Tolerated ceftriaxone   Sulfa Antibiotics Other (See Comments)   unknown      Medication List    STOP taking these medications   ammonium lactate 12 % lotion Commonly known as: LAC-HYDRIN   aspirin 81 MG tablet   atorvastatin 40 MG tablet Commonly known as: LIPITOR   cholecalciferol 25 MCG (1000 UT) tablet Commonly known as: VITAMIN D3   dexamethasone 6  MG tablet Commonly known as: DECADRON   diclofenac sodium 1 % Gel Commonly known as: VOLTAREN   Ensure   erythromycin ophthalmic ointment   feeding supplement (PRO-STAT SUGAR FREE 64) Liqd   fluticasone 50 MCG/ACT nasal spray Commonly known as: FLONASE   Glycerol Monooleate Powd   hydrocortisone 25 MG suppository Commonly known as: ANUSOL-HC   magnesium hydroxide 400 MG/5ML suspension Commonly known as: MILK OF MAGNESIA   Menthol-Zinc Oxide 0.45-20 % Oint   metoprolol succinate 25 MG 24 hr  tablet Commonly known as: TOPROL-XL   Mintox Regular Strength 200-200-20 MG/5ML suspension Generic drug: alum & mag hydroxide-simeth   multivitamin with minerals Tabs tablet   risperiDONE 1 MG tablet Commonly known as: RISPERDAL   rivastigmine 1.5 MG capsule Commonly known as: EXELON   senna-docusate 8.6-50 MG tablet Commonly known as: Senokot-S   Systane 0.4-0.3 % Soln Generic drug: Polyethyl Glycol-Propyl Glycol   tetrahydrozoline 0.05 % ophthalmic solution   traZODone 50 MG tablet Commonly known as: DESYREL   valproic acid 250 MG capsule Commonly known as: DEPAKENE   zinc sulfate 220 (50 Zn) MG capsule     TAKE these medications   acetaminophen 325 MG tablet Commonly known as: TYLENOL Take 2 tablets (650 mg total) by mouth every 6 (six) hours as needed for mild pain (or Fever >/= 101). What changed:   medication strength  how much to take  when to take this  reasons to take this   acetaminophen 650 MG suppository Commonly known as: TYLENOL Place 1 suppository (650 mg total) rectally every 6 (six) hours as needed for mild pain (or Fever >/= 101). What changed: You were already taking a medication with the same name, and this prescription was added. Make sure you understand how and when to take each.   chlorpheniramine-HYDROcodone 10-8 MG/5ML Suer Commonly known as: TUSSIONEX Take 5 mLs by mouth every 12 (twelve) hours as needed for cough.   glycopyrrolate 1 MG tablet Commonly known as: ROBINUL Take 1 tablet (1 mg total) by mouth every 4 (four) hours as needed (excessive secretions).   glycopyrrolate 0.2 MG/ML injection Commonly known as: ROBINUL Inject 1 mL (0.2 mg total) into the skin every 4 (four) hours as needed (excessive secretions).   glycopyrrolate 0.2 MG/ML injection Commonly known as: ROBINUL Inject 1 mL (0.2 mg total) into the vein every 4 (four) hours as needed (excessive secretions).   lip balm ointment Apply topically as needed for  lip care.   LORazepam 2 MG/ML injection Commonly known as: ATIVAN Inject 0.5-1 mLs (1-2 mg total) into the vein every 4 (four) hours as needed for anxiety.   metoprolol tartrate 5 MG/5ML Soln injection Commonly known as: LOPRESSOR Inject 5 mLs (5 mg total) into the vein every 4 (four) hours as needed (heart rate more than 130).   morphine 2 MG/ML injection Inject 0.5-1 mLs (1-2 mg total) into the vein every 30 (thirty) minutes as needed (or dyspnea).   mouth rinse Liqd solution 15 mLs by Mouth Rinse route 2 (two) times daily.   antiseptic oral rinse Liqd Apply 15 mLs topically as needed for dry mouth.   ondansetron 4 MG disintegrating tablet Commonly known as: ZOFRAN-ODT Take 1 tablet (4 mg total) by mouth every 6 (six) hours as needed for nausea.   ondansetron 4 MG/2ML Soln injection Commonly known as: ZOFRAN Inject 2 mLs (4 mg total) into the vein every 6 (six) hours as needed for nausea.   polyvinyl alcohol 1.4 % ophthalmic  solution Commonly known as: LIQUIFILM TEARS Place 1 drop into both eyes 4 (four) times daily as needed for dry eyes.   sodium chloride flush 0.9 % Soln Commonly known as: NS 10-40 mLs by Intracatheter route as needed (flush).       Day of Discharge BP (!) 170/83 (BP Location: Left Arm)    Pulse (!) 102    Temp 98.4 F (36.9 C) (Axillary)    Resp 18    Ht  (1.626 m)    Wt 49.5 kg    SpO2 92%    BMI 18.73 kg/m   Physical Exam: General: No acute respiratory distress Lungs: Clear to auscultation bilaterally without wheezes or crackles Cardiovascular: Regular rate and rhythm without murmur gallop or rub normal S1 and S2 Abdomen: Nontender, nondistended, soft, bowel sounds positive, no rebound, no ascites, no appreciable mass Extremities: No significant cyanosis, clubbing, or edema bilateral lower extremities   Recent Results (from the past 240 hour(s))  SARS CORONAVIRUS 2 (TAT 6-24 HRS) Nasopharyngeal Nasopharyngeal Swab     Status: None    Collection Time: 05/30/19  1:50 PM   Specimen: Nasopharyngeal Swab  Result Value Ref Range Status   SARS Coronavirus 2 NEGATIVE NEGATIVE Final    Comment: (NOTE) SARS-CoV-2 target nucleic acids are NOT DETECTED. The SARS-CoV-2 RNA is generally detectable in upper and lower respiratory specimens during the acute phase of infection. Negative results do not preclude SARS-CoV-2 infection, do not rule out co-infections with other pathogens, and should not be used as the sole basis for treatment or other patient management decisions. Negative results must be combined with clinical observations, patient history, and epidemiological information. The expected result is Negative. Fact Sheet for Patients: HairSlick.no Fact Sheet for Healthcare Providers: quierodirigir.com This test is not yet approved or cleared by the [place] FDA and  has been authorized for detection and/or diagnosis of SARS-CoV-2 by FDA under an Emergency Use Authorization (EUA). This EUA will remain  in effect (meaning this test can be used) for the duration of the COVID-19 declaration under Section 56 4(b)(1) of the Act, 21 U.S.C. section 360bbb-3(b)(1), unless the authorization is terminated or revoked sooner. Performed at [facility], [address] [place], [place] [address]       Time spent in discharge (includes decision making & examination of pt): <30 minutes  05/31/2019, 4:12 PM   Lonia Blood, MD [facility] Office  [phone]

## 2019-05-31 NOTE — Discharge Instructions (Signed)
Hospice °Hospice is a service that is designed to provide people who are terminally ill and their families with medical, spiritual, and psychological support. Its aim is to improve your quality of life by keeping you as comfortable as possible in the final stages of life. °Who will be my providers when I begin hospice care? °Hospice teams often include: °· A nurse. °· A doctor. The hospice doctor will be available for your care, but you can include your regular doctor or nurse practitioner. °· A social worker. °· A counselor. °· A religious leader (such as a chaplain). °· A dietitian. °· Therapists. °· Trained volunteers who can help with care. °What services does hospice provide? °Hospice services can vary depending on the center or organization. Generally, they include: °· Ways to keep you comfortable, such as: °? Providing care in your home or in a home-like setting. °? Working with your family and friends to help meet your needs. °? Allowing you to enjoy the support of loved ones by receiving much of your basic care from family and friends. °· Pain relief and symptom management. The staff will supply all necessary medicines and equipment so that you can stay comfortable and alert enough to enjoy the company of your friends and family. °· Visits or care from a nurse and doctor. This may include 24-hour on-call services. °· Companionship when you are alone. °· Allowing you and your family to rest. Hospice staff may do light housekeeping, prepare meals, and run errands. °· Counseling. They will make sure your emotional, spiritual, and social needs are being met, as well as those needs of your family members. °· Spiritual care. This will be individualized to meet your needs and your family's needs. It may involve: °? Helping you and your family understand the dying process. °? Helping you say goodbye to your family and friends. °? Performing a specific religious ceremony or ritual. °· Massage. °· Nutrition  therapy. °· Physical and occupational therapy. °· Short-term inpatient care, if something cannot be managed in the home. °· Art or music therapy. °· Bereavement support for grieving family members. °When should hospice care begin? °Most people who use hospice are believed to have less than 6 months to live. °· Your family and health care providers can help you decide when hospice services should begin. °· If you live longer than 6 months but your condition does not improve, your doctor may be able to approve you for continued hospice care. °· If your condition improves, you may discontinue the program. °What should I consider before selecting a program? °Most hospice programs are run by nonprofit, independent organizations. Some are affiliated with hospitals, nursing homes, or home health care agencies. Hospice programs can take place in your home or at a hospice center, hospital, or skilled nursing facility. When choosing a hospice program, ask the following questions: °· What services are available to me? °· What services will be offered to my loved ones? °· How involved will my loved ones be? °· How involved will my health care provider be? °· Who makes up the hospice care team? How are they trained or screened? °· How will my pain and symptoms be managed? °· If my circumstances change, can the services be provided in a different setting, such as my home or in the hospital? °· Is the program reviewed and licensed by the state or certified in some other way? °· What does it cost? Is it covered by insurance? °· If I choose a hospice   center or nursing home, where is the hospice center located? Is it convenient for family and friends?  If I choose a hospice center or nursing home, can my family and friends visit any time?  Will you provide emotional and spiritual support?  Who can my family call with questions? Where can I learn more about hospice? You can learn about existing hospice programs in your area  from your health care providers. You can also read more about hospice online. The websites of the following organizations have helpful information:  [facility]): http://www.brown-buchanan.com/  National Association for [facility]): http://massey-hart.com/  Hospice Foundation of [place] ([place]): www.hospicefoundation.org  American Cancer Society (ACS): www.cancer.org  Hospice Net: www.hospicenet.org  Visiting Nurse Associations of [place] (VNAA): www.vnaa.org You may also find more information by contacting the following agencies:  A local agency on aging.  Your local [employer] chapter.  Your state's department of health or social services. Summary  Hospice is a service that is designed to provide people who are terminally ill and their families with medical, spiritual, and psychological support.  Hospice aims to improve your quality of life by keeping you as comfortable as possible in the final stages of life.  Hospice teams often include a doctor, nurse, social worker, counselor, religious leader,dietitian, therapists, and volunteers.  Hospice care generally includes medicine for symptom management, visits from doctors and nurses, physical and occupational therapy, nutrition counseling, spiritual and emotional counseling, caregiver support, and bereavement support for grieving family members.  Hospice programs can take place in your home or at a hospice center, hospital, or skilled nursing facility. This information is not intended to replace advice given to you by your health care provider. Make sure you discuss any questions you have with your health care provider. Document Released: 10/10/2003 Document Revised: 06/05/2017 Document Reviewed: 07/15/2016 Elsevier Patient Education  2020 [employer].

## 2019-06-01 ENCOUNTER — Inpatient Hospital Stay
Admission: AD | Admit: 2019-06-01 | Discharge: 2019-06-07 | DRG: 951 | Disposition: E | Payer: Medicare Other | Source: Other Acute Inpatient Hospital | Attending: Internal Medicine | Admitting: Internal Medicine

## 2019-06-01 DIAGNOSIS — R627 Adult failure to thrive: Secondary | ICD-10-CM | POA: Diagnosis present

## 2019-06-01 DIAGNOSIS — Z515 Encounter for palliative care: Principal | ICD-10-CM

## 2019-06-01 DIAGNOSIS — E43 Unspecified severe protein-calorie malnutrition: Secondary | ICD-10-CM | POA: Diagnosis present

## 2019-06-01 DIAGNOSIS — F0280 Dementia in other diseases classified elsewhere without behavioral disturbance: Secondary | ICD-10-CM | POA: Diagnosis present

## 2019-06-01 DIAGNOSIS — Z681 Body mass index (BMI) 19 or less, adult: Secondary | ICD-10-CM | POA: Diagnosis not present

## 2019-06-01 DIAGNOSIS — Z8619 Personal history of other infectious and parasitic diseases: Secondary | ICD-10-CM

## 2019-06-01 DIAGNOSIS — Z79899 Other long term (current) drug therapy: Secondary | ICD-10-CM

## 2019-06-01 DIAGNOSIS — N179 Acute kidney failure, unspecified: Secondary | ICD-10-CM | POA: Diagnosis present

## 2019-06-01 DIAGNOSIS — G9341 Metabolic encephalopathy: Secondary | ICD-10-CM | POA: Diagnosis present

## 2019-06-01 DIAGNOSIS — G309 Alzheimer's disease, unspecified: Secondary | ICD-10-CM | POA: Diagnosis present

## 2019-06-01 DIAGNOSIS — I10 Essential (primary) hypertension: Secondary | ICD-10-CM | POA: Diagnosis present

## 2019-06-01 DIAGNOSIS — G301 Alzheimer's disease with late onset: Secondary | ICD-10-CM

## 2019-06-01 DIAGNOSIS — N390 Urinary tract infection, site not specified: Secondary | ICD-10-CM | POA: Diagnosis present

## 2019-06-01 DIAGNOSIS — E440 Moderate protein-calorie malnutrition: Secondary | ICD-10-CM | POA: Diagnosis present

## 2019-06-01 DIAGNOSIS — A419 Sepsis, unspecified organism: Secondary | ICD-10-CM | POA: Diagnosis present

## 2019-06-01 DIAGNOSIS — Z66 Do not resuscitate: Secondary | ICD-10-CM | POA: Diagnosis present

## 2019-06-01 DIAGNOSIS — Z8249 Family history of ischemic heart disease and other diseases of the circulatory system: Secondary | ICD-10-CM

## 2019-06-01 DIAGNOSIS — J690 Pneumonitis due to inhalation of food and vomit: Secondary | ICD-10-CM | POA: Diagnosis present

## 2019-06-01 MED ORDER — OXYBUTYNIN CHLORIDE 5 MG PO TABS
2.5000 mg | ORAL_TABLET | Freq: Four times a day (QID) | ORAL | Status: DC | PRN
  Filled 2019-06-01: qty 0.5

## 2019-06-01 MED ORDER — BIOTENE DRY MOUTH MT LIQD: 15.0000 mL | OROMUCOSAL | Status: DC | PRN

## 2019-06-01 MED ORDER — LORAZEPAM 2 MG/ML IJ SOLN: 1.0000 mg | INTRAMUSCULAR | Status: DC | PRN

## 2019-06-01 MED ORDER — TEMAZEPAM 15 MG PO CAPS: 15.0000 mg | ORAL_CAPSULE | Freq: Every evening | ORAL | Status: DC | PRN

## 2019-06-01 MED ORDER — HALOPERIDOL LACTATE 5 MG/ML IJ SOLN: 0.5000 mg | INTRAMUSCULAR | Status: DC | PRN

## 2019-06-01 MED ORDER — POLYVINYL ALCOHOL 1.4 % OP SOLN
1.0000 [drp] | Freq: Four times a day (QID) | OPHTHALMIC | Status: DC | PRN
  Filled 2019-06-01: qty 15

## 2019-06-01 MED ORDER — HALOPERIDOL LACTATE 2 MG/ML PO CONC
0.5000 mg | ORAL | Status: DC | PRN
  Filled 2019-06-01: qty 0.3

## 2019-06-01 MED ORDER — ATROPINE SULFATE 1 % OP SOLN
4.0000 [drp] | OPHTHALMIC | Status: DC | PRN
  Filled 2019-06-01: qty 2

## 2019-06-01 MED ORDER — ACETAMINOPHEN 650 MG RE SUPP: 650.0000 mg | Freq: Four times a day (QID) | RECTAL | Status: DC | PRN

## 2019-06-01 MED ORDER — SENNA 8.6 MG PO TABS: 1.0000 | ORAL_TABLET | Freq: Every evening | ORAL | Status: DC | PRN

## 2019-06-01 MED ORDER — ACETAMINOPHEN 325 MG PO TABS: 650.0000 mg | ORAL_TABLET | Freq: Four times a day (QID) | ORAL | Status: DC | PRN

## 2019-06-01 MED ORDER — ONDANSETRON 4 MG PO TBDP
4.0000 mg | ORAL_TABLET | Freq: Four times a day (QID) | ORAL | Status: DC | PRN
  Filled 2019-06-01: qty 1

## 2019-06-01 MED ORDER — HALOPERIDOL 0.5 MG PO TABS
0.5000 mg | ORAL_TABLET | ORAL | Status: DC | PRN
  Filled 2019-06-01: qty 1

## 2019-06-01 MED ORDER — MORPHINE SULFATE (CONCENTRATE) 10 MG/0.5ML PO SOLN
5.0000 mg | ORAL | Status: DC | PRN
  Administered 2019-06-01 (×3): 5 mg via SUBLINGUAL
  Filled 2019-06-01 (×3): qty 1

## 2019-06-01 MED ORDER — PANTOPRAZOLE SODIUM 40 MG PO TBEC: 40.0000 mg | DELAYED_RELEASE_TABLET | Freq: Every day | ORAL | Status: DC

## 2019-06-01 MED ORDER — MORPHINE SULFATE (PF) 2 MG/ML IV SOLN
INTRAVENOUS | Status: AC
  Administered 2019-06-01: 2 mg via INTRAMUSCULAR
  Filled 2019-06-01: qty 1

## 2019-06-01 MED ORDER — IPRATROPIUM-ALBUTEROL 0.5-2.5 (3) MG/3ML IN SOLN
3.0000 mL | Freq: Four times a day (QID) | RESPIRATORY_TRACT | Status: DC
  Administered 2019-06-01 – 2019-06-02 (×4): 3 mL via RESPIRATORY_TRACT
  Filled 2019-06-01 (×5): qty 3

## 2019-06-01 MED ORDER — LORAZEPAM 1 MG PO TABS: 1.0000 mg | ORAL_TABLET | ORAL | Status: DC | PRN

## 2019-06-01 MED ORDER — MAGIC MOUTHWASH W/LIDOCAINE
15.0000 mL | Freq: Four times a day (QID) | ORAL | Status: DC | PRN
  Filled 2019-06-01: qty 15

## 2019-06-01 MED ORDER — DEXTROSE-NACL 5-0.45 % IV SOLN
INTRAVENOUS | Status: DC
  Administered 2019-06-01 – 2019-06-02 (×2): via INTRAVENOUS

## 2019-06-01 MED ORDER — MORPHINE SULFATE (CONCENTRATE) 10 MG/0.5ML PO SOLN
5.0000 mg | ORAL | Status: DC | PRN
  Filled 2019-06-01: qty 1

## 2019-06-01 MED ORDER — LORAZEPAM 2 MG/ML PO CONC
1.0000 mg | ORAL | Status: DC | PRN
  Filled 2019-06-01: qty 0.5

## 2019-06-01 MED ORDER — DIPHENHYDRAMINE HCL 50 MG/ML IJ SOLN: 12.5000 mg | INTRAMUSCULAR | Status: DC | PRN

## 2019-06-01 MED ORDER — ONDANSETRON HCL 4 MG/2ML IJ SOLN: 4.0000 mg | Freq: Four times a day (QID) | INTRAMUSCULAR | Status: DC | PRN

## 2019-06-01 MED ORDER — LORAZEPAM 2 MG/ML IJ SOLN
INTRAMUSCULAR | Status: AC
  Administered 2019-06-01: 2 mg
  Filled 2019-06-01: qty 1

## 2019-06-01 NOTE — Progress Notes (Signed)
Patient seen briefly before she was taken to [facility].  Please review discharge summary dictated by Dr. Thereasa Solo yesterday.  78 year old female with history of advanced dementia who presented with worsening confusion.  She was diagnosed with a Proteus found to be positive for COVID-19.  She was transferred to [facility].  She completed treatment course for pneumonia.  She also completed steroid course.  Unfortunately she did not show any improvement in her mentation.  She continued to remain delirious.    Despite monitoring her for several days patient did not improve.  Her oral take was extremely poor.  Discussions were held with family.  She was transition to comfort care.  Plan was for the patient to go to hospice facility however there are no beds available.  Family requested visitation.  Per [facility] campus policies only one visitation was allowed here however family wanted to visit the patient again.  To facilitate this it was felt that patient will benefit from transfer to [facility] so that family can visit with her.  So patient being discharged to [facility].    No overnight events noted.  Patient seems to be comfortable.  She does open her eyes when I call her name.  Her nonessential medications have been discontinued.  She is only on comfort medications.  Continue comfort care plan as outlined in the discharge summary.   Tiffany Turner 06/03/2019

## 2019-06-01 NOTE — Plan of Care (Signed)

## 2019-06-01 NOTE — Plan of Care (Signed)
  Problem: Education: Goal: Knowledge of risk factors and measures for prevention of condition will improve Outcome: Progressing   Problem: Coping: Goal: Psychosocial and spiritual needs will be supported Outcome: Progressing   Problem: Respiratory: Goal: Will maintain a patent airway Outcome: Progressing   Problem: Respiratory: Goal: Will maintain a patent airway Outcome: Progressing Goal: Complications related to the disease process, condition or treatment will be avoided or minimized Outcome: Progressing   Problem: Education: Goal: Knowledge of General Education information will improve Description: Including pain rating scale, medication(s)/side effects and non-pharmacologic comfort measures Outcome: Progressing   Problem: Skin Integrity: Goal: Risk for impaired skin integrity will decrease Outcome: Progressing   Problem: Urinary Elimination: Goal: Signs and symptoms of infection will decrease Outcome: Progressing

## 2019-06-01 NOTE — Care Management Important Message (Signed)
Important Message  Patient Details  Name: Tiffany Turner MRN: [id number] Date of Birth: [birth year]   Medicare Important Message Given:  Yes - Important Message mailed due to current National Emergency  Verbal consent obtained due to current National Emergency  Relationship to patient: Child Contact Name: Aubryn Spinola Call Date: 06-04-19  Time: 1017 Phone: [phone] Outcome: Spoke with contact Important Message mailed to: Emergency contact on file    [place] 06-04-19, 10:17 AM

## 2019-06-01 NOTE — H&P (Signed)
History and Physical    Tiffany FinnerJosephine M Ciolino QMV:[id number] DOB: 05/29/1941 DOA: 05/29/2019  PCP: Patient, No Pcp [facility]  Chief Complaint: Failure to thrive  HPI: Tiffany Turner is a 78 y.o. female with medical history significant of advanced dementia, essential hypertension, CAD initially presented [facility] the [facility] for change in mental status diagnosed with AKI, urinary tract infection and also COVID-19.  She was [facility] and was treated with full course of remdesivir and Decadron for her pneumonia.  She also completed treatment for Proteus UTI.  During the hospitalization she had persistent aspiration complicated with aspiration pneumonitis.  Off-and-on had some electrolyte abnormality.  Her malnourishment continue to worsen due to poor oral intake which was further complicated by terminal dementia, loss of interest and ability to consume any sort of nutrition.  She was made comfort care and eventually transition to [facility] due to less restrictive visitation hours while awaiting placement at hospice.   Review of Systems: As per HPI otherwise 10 point review of systems negative.  Review of Systems Otherwise negative except as per HPI, including: Difficult to obtain due to her mentation  Past Medical History:  Diagnosis Date  . Coronary artery disease    stents placed  . Dementia (HCC)   . Hip dislocation, right (HCC)    hx of dislocation  . Hyperlipemia   . Hypertension     Past Surgical History:  Procedure Laterality Date  . CARDIAC SURGERY     stents  . CORONARY ANGIOPLASTY WITH STENT PLACEMENT    . TOTAL HIP ARTHROPLASTY Right     SOCIAL HISTORY:  reports that she has been smoking cigarettes. She has been smoking about 0.50 packs per day. She has never used smokeless tobacco. She reports that she does not drink alcohol or use drugs.  Allergies  Allergen Reactions  . Penicillins Other (See Comments)    Has  patient had a PCN reaction causing immediate rash, facial/tongue/throat swelling, SOB or lightheadedness with hypotension: Unknown Has patient had a PCN reaction causing severe rash involving mucus membranes or skin necrosis: Unknown Has patient had a PCN reaction that required hospitalization: Unknown Has patient had a PCN reaction occurring within the last 10 years: Unknown If all of the above answers are "NO", then may proceed with Cephalosporin use. Tolerated ceftriaxone  . Sulfa Antibiotics Other (See Comments)    unknown    FAMILY HISTORY: Family History  Problem Relation Age of Onset  . CAD Father      Prior to Admission medications   Medication Sig Start Date End Date Taking? Authorizing Provider  acetaminophen (TYLENOL) 325 MG tablet Take 2 tablets (650 mg total) by mouth every 6 (six) hours as needed for mild pain (or Fever >/= 101). 05/31/19   Lonia BloodMcClung, Jeffrey T, MD  acetaminophen (TYLENOL) 650 MG suppository Place 1 suppository (650 mg total) rectally every 6 (six) hours as needed for mild pain (or Fever >/= 101). 05/31/19   Lonia BloodMcClung, Jeffrey T, MD  antiseptic oral rinse (BIOTENE) LIQD Apply 15 mLs topically as needed for dry mouth. 05/31/19   Lonia BloodMcClung, Jeffrey T, MD  chlorpheniramine-HYDROcodone (TUSSIONEX) 10-8 MG/5ML SUER Take 5 mLs by mouth every 12 (twelve) hours as needed for cough. 05/31/19   Lonia BloodMcClung, Jeffrey T, MD  glycopyrrolate (ROBINUL) 0.2 MG/ML injection Inject 1 mL (0.2 mg total) into the skin every 4 (four) hours as needed (excessive secretions). 05/31/19   Lonia BloodMcClung, Jeffrey T, MD  glycopyrrolate (ROBINUL) 0.2 MG/ML injection Inject 1 mL (0.2 mg total) into the vein every 4 (four) hours as needed (excessive secretions). 05/31/19   Lonia Blood, MD  glycopyrrolate (ROBINUL) 1 MG tablet Take 1 tablet (1 mg total) by mouth every 4 (four) hours as needed (excessive secretions). 05/31/19   Lonia Blood, MD  lip balm (CARMEX) ointment Apply topically as needed  for lip care. 05/31/19   Lonia Blood, MD  LORazepam (ATIVAN) 2 MG/ML injection Inject 0.5-1 mLs (1-2 mg total) into the vein every 4 (four) hours as needed for anxiety. 05/31/19   Lonia Blood, MD  metoprolol tartrate (LOPRESSOR) 5 MG/5ML SOLN injection Inject 5 mLs (5 mg total) into the vein every 4 (four) hours as needed (heart rate more than 130). 05/31/19   Lonia Blood, MD  morphine 2 MG/ML injection Inject 0.5-1 mLs (1-2 mg total) into the vein every 30 (thirty) minutes as needed (or dyspnea). 05/31/19   Lonia Blood, MD  mouth rinse LIQD solution 15 mLs by Mouth Rinse route 2 (two) times daily. 05/31/19   Lonia Blood, MD  ondansetron (ZOFRAN) 4 MG/2ML SOLN injection Inject 2 mLs (4 mg total) into the vein every 6 (six) hours as needed for nausea. 05/31/19   Lonia Blood, MD  ondansetron (ZOFRAN-ODT) 4 MG disintegrating tablet Take 1 tablet (4 mg total) by mouth every 6 (six) hours as needed for nausea. 05/31/19   Lonia Blood, MD  polyvinyl alcohol (LIQUIFILM TEARS) 1.4 % ophthalmic solution Place 1 drop into both eyes 4 (four) times daily as needed for dry eyes. 05/31/19   Lonia Blood, MD  sodium chloride flush (NS) 0.9 % SOLN 10-40 mLs by Intracatheter route as needed (flush). 05/31/19   Lonia Blood, MD    Physical Exam: There were no vitals filed for this visit.    Constitutional: Not in acute distress, elderly frail. Obtunded.  Neck: normal, supple, no masses, no thyromegaly Respiratory: Shallow breathing with mild rhonchi, tachypnea Cardiovascular: Normal sinus rhythm Abdomen: Nontender nondistended Musculoskeletal: No lower extremity edema, diffuse muscle wasting Skin: Some superficial skin bruising Neurologic: No focal deficits Psychiatric: difficult to assess due to her mentation.    Labs on Admission: I have personally reviewed following labs and imaging studies  CBC: No results for input(s): WBC, NEUTROABS, HGB,  HCT, MCV, PLT in the last 168 hours. Basic Metabolic Panel: No results for input(s): NA, K, CL, CO2, GLUCOSE, BUN, CREATININE, CALCIUM, MG, PHOS in the last 168 hours. GFR: Estimated Creatinine Clearance: 44.6 mL/min (by C-G formula based on SCr of 0.7 mg/dL). Liver Function Tests: No results for input(s): AST, ALT, ALKPHOS, BILITOT, PROT, ALBUMIN in the last 168 hours. No results for input(s): LIPASE, AMYLASE in the last 168 hours. No results for input(s): AMMONIA in the last 168 hours. Coagulation Profile: No results for input(s): INR, PROTIME in the last 168 hours. Cardiac Enzymes: No results for input(s): CKTOTAL, CKMB, CKMBINDEX, TROPONINI in the last 168 hours. BNP (last 3 results) No results for input(s): PROBNP in the last 8760 hours. HbA1C: No results for input(s): HGBA1C in the last 72 hours. CBG: No results for input(s): GLUCAP in the last 168 hours. Lipid Profile: No results for input(s): CHOL, HDL, LDLCALC, TRIG, CHOLHDL, LDLDIRECT in the last 72 hours. Thyroid Function Tests: No results for input(s): TSH, T4TOTAL, FREET4, T3FREE, THYROIDAB in the last 72 hours. Anemia Panel: No results for input(s): VITAMINB12, FOLATE, FERRITIN, TIBC, IRON, RETICCTPCT in  the last 72 hours. Urine analysis:    Component Value Date/Time   COLORURINE AMBER (A) 05/02/2019 0826   APPEARANCEUR TURBID (A) 05/02/2019 0826   APPEARANCEUR Turbid 07/29/2014 1548   LABSPEC 1.020 05/02/2019 0826   LABSPEC 1.018 07/29/2014 1548   PHURINE 7.0 05/02/2019 0826   GLUCOSEU NEGATIVE 05/02/2019 0826   GLUCOSEU Negative 07/29/2014 1548   HGBUR LARGE (A) 05/02/2019 0826   BILIRUBINUR NEGATIVE 05/02/2019 0826   KETONESUR NEGATIVE 05/02/2019 0826   PROTEINUR >=300 (A) 05/02/2019 0826   NITRITE NEGATIVE 05/02/2019 0826   LEUKOCYTESUR MODERATE (A) 05/02/2019 0826   LEUKOCYTESUR 3+ 07/29/2014 1548   Sepsis Labs: !!!!!!!!!!!!!!!!!!!!!!!!!!!!!!!!!!!!!!!!!!!! @LABRCNTIP (procalcitonin:4,lacticidven:4) )  Recent Results (from the past 240 hour(s))  SARS CORONAVIRUS 2 (TAT 6-24 HRS) Nasopharyngeal Nasopharyngeal Swab     Status: None   Collection Time: 05/30/19  1:50 PM   Specimen: Nasopharyngeal Swab  Result Value Ref Range Status   SARS Coronavirus 2 NEGATIVE NEGATIVE Final    Comment: (NOTE) SARS-CoV-2 target nucleic acids are NOT DETECTED. The SARS-CoV-2 RNA is generally detectable in upper and lower respiratory specimens during the acute phase of infection. Negative results do not preclude SARS-CoV-2 infection, do not rule out co-infections with other pathogens, and should not be used as the sole basis for treatment or other patient management decisions. Negative results must be combined with clinical observations, patient history, and epidemiological information. The expected result is Negative. Fact Sheet for Patients: SugarRoll.be Fact Sheet for Healthcare Providers: https://www.woods-mathews.com/ This test is not yet approved or cleared by the [place] FDA and  has been authorized for detection and/or diagnosis of SARS-CoV-2 by FDA under an Emergency Use Authorization (EUA). This EUA will remain  in effect (meaning this test can be used) for the duration of the COVID-19 declaration under Section 56 4(b)(1) of the Act, 21 U.S.C. section 360bbb-3(b)(1), unless the authorization is terminated or revoked sooner. Performed at [facility], [place] [address], [place], [place] [address]      Radiological Exams on Admission: No results found.     Assessment/Plan Active Problems:   Hypertension   Acute metabolic encephalopathy   Alzheimer's dementia ([place])   Protein-calorie malnutrition, moderate (HCC)   Failure to thrive in adult   End of life care    Adult failure to thrive Moderate to severe protein calorie malnutrition Poor oral intake -Patient admitted for comfort care measures.  Awaiting placement at hospice care  facility.  Comfort feeding, supportive care. Son wishes to give IV fluids therefore D5 half-normal saline ordered  Terminal/advanced Alzheimer's dementia -Supportive care.  COVID-19 pneumonia -Treated with remdesivir and dexamethasone.  Repeat testing is negative now.  No longer requiring isolation.  Proteus urinary tract infection-completed antibiotic course  Essential hypertension -Would avoid giving any antihypertensives at this point.  DVT prophylaxis: Comfort measures Code Status: DNR Family Communication: Spoke with her son. Admitted for comfort care until placement.   Time Spent: 65 minutes.  >50% of the time was devoted to discussing the patients care, assessment, plan and disposition with other care givers along with counseling the patient about the risks and benefits of treatment.     Arsenio Loader MD [facility]  If 7PM-7AM, please contact night-coverage   05/18/2019, 1:26 PM

## 2019-06-01 NOTE — Progress Notes (Addendum)
Spoke with son three times during shift on pt's status and her transferring to [facility]. Attempted to give report to nurse @ [facility], unsuccessful  [phone] was told nurse will call back to get report.

## 2019-06-01 NOTE — Progress Notes (Signed)
Son Gerald Stabs made aware that pt is in room 160, comfort care continues

## 2019-06-01 NOTE — Progress Notes (Signed)
Called report to [facility] hospital. Notified RN of patient getting morphine IV before discharge. All belongings with patient. Son to be notified of discharge. Midline IV removed.

## 2019-06-01 NOTE — Progress Notes (Signed)
Son Tiffany Turner has arrived and is at the bedside, PRN Roxanol given per orders

## 2019-06-07 NOTE — Progress Notes (Signed)
Patient ID: Tiffany Turner, female   DOB: August 04, 1940, 78 y.o.   MRN: [id number]  I signed a death certificate on this patient on the day of death on June 14, 2019.  There is certificate was given to the patient's nurse on the floor.  I spoke with the patient's son about the death certificate at that time.  The nursing supervisor brought me the death certificate today on 06-18-19.  I filled out a second death certificate. I called the funeral home and spoke with Velva Harman at [facility] [employer] [employer] [facility] at [phone].  She states that they never received a death certificate.  I asked the nursing supervisor to look into what happened to the first death certificate that I signed.  Dr Loletha Grayer

## 2019-06-07 NOTE — Progress Notes (Signed)
   06/06/2019 1000  Clinical Encounter Type  Visited With Patient and family together  Visit Type Initial;Death  Referral From Nurse  Spiritual Encounters  Spiritual Needs Grief support  Ch was paged upon pt's death. Son and Son's fiance were at bedside. Ch provided space for the Son to be able to share about the pt. Son described the pt as hard-headed, independent, and strong. Son said the rest of the family were on their way. Ch informed that they can call the ch again when a need arises. The family is appropriately grieving and seems to be in peace as they have been preparing for this for a while.

## 2019-06-07 NOTE — Progress Notes (Signed)
Son has arrived, speaking with Dr Leslye Peer in pts room

## 2019-06-07 NOTE — Progress Notes (Signed)
Tech entered pts room and called this nurse, upon entering room pt noted without pulse or respirations, attempted to call son Gerald Stabs x3, voicemail not set up, unable to leave message, will try to call again, Dr Leslye Peer aware

## 2019-06-07 NOTE — Death Summary Note (Signed)
Death Summary  Tiffany Turner LGX:[id number] DOB: 21-Nov-1940 DOA: June 06, 2019  PCP: Patient, No Pcp Per  Admit date: 06/06/2019 Date of Death: 07-Jun-2019 Time of Death: 845am Notification: Patient, No Pcp Per notified of death of 07-Jun-2019   Code Status= DNR comfort care  Brief Summary/Hospital Stay:  78 y.o. female with medical history significant of advanced dementia, essential hypertension, CAD initially presented to the hospital at [facility] for change in mental status diagnosed with AKI, urinary tract infection and also COVID-19.  She was transferred to [facility] and was treated with full course of remdesivir and Decadron for her pneumonia.  She also completed treatment for Proteus UTI.  During the hospitalization she had persistent aspiration complicated with aspiration pneumonitis.  Off-and-on had some electrolyte abnormality.  Her malnourishment continue to worsen due to poor oral intake which was further complicated by terminal dementia, loss of interest and ability to consume any sort of nutrition.  She was made comfort care and eventually transition to [facility] due to less restrictive visitation hours while awaiting placement at hospice. While at [facility] patient was made comfortable.  The following morning around 8:45 AM she was deceased.  Was pronounced by the nursing staff.  Case was discussed by the nursing staff with Dr. Earleen Newport who also spoke with the family.   Final/Principal Diagnoses:  1.   Sepsis Secondary to Urinary Tract Infection  2.  Urinary Trach Infection 3. Severe malnuritrion  4. Acute Kidney Injury  5. Aspiration Pneumonia/Pneunonitis  6. COVID 19 pneumonia.   Disposition/Follow up Care: Patient is deceased.   Discharge medications: None  The results of significant diagnostics from this hospitalization (including imaging, microbiology, ancillary and laboratory) are listed below for reference.    Significant Diagnostic Studies: Dg Chest 1 View   Result Date: 05/15/2019 CLINICAL DATA:  Dyspnea EXAM: CHEST  1 VIEW COMPARISON:  05/07/2019 FINDINGS: Bibasilar hazy airspace disease. Trace left pleural effusion. No pneumothorax. Stable cardiomediastinal silhouette. Thoracic aortic atherosclerosis. Generalized osteopenia. No acute osseous abnormality. IMPRESSION: Bibasilar airspace disease which may reflect atelectasis versus pneumonia. Electronically Signed   By: Kathreen Devoid   On: 05/15/2019 14:54   Dg Abd 1 View  Result Date: 05/13/2019 CLINICAL DATA:  NG tube placement EXAM: ABDOMEN - 1 VIEW COMPARISON:  Chest radiograph 05/07/2019 FINDINGS: Transesophageal tube tip and side port distal to the GE junction directed inferiorly likely within the gastric body. Surgical clips are present in the right upper quadrant. Extensive vascular calcium is noted throughout the upper abdomen. Streaky atelectatic changes are present in lung bases. Included mediastinal contours are unremarkable. Degenerative changes are noted in the imaged spine. IMPRESSION: 1. Transesophageal tube tip and side port distal to the GE junction, likely within the gastric body. 2. Extensive vascular calcium throughout the upper abdomen. Electronically Signed   By: Lovena Le M.D.   On: 05/13/2019 21:07   Ct Head Wo Contrast  Result Date: 05/09/2019 CLINICAL DATA:  Altered mental status EXAM: CT HEAD WITHOUT CONTRAST TECHNIQUE: Contiguous axial images were obtained from the base of the skull through the vertex without intravenous contrast. 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 No acute intracranial abnormality. Specifically, no hemorrhage, hydrocephalus, mass lesion, acute infarction, or significant intracranial injury. There is atrophy and chronic small vessel disease changes. Vascular: No hyperdense vessel or unexpected calcification. Skull: Postsurgical changes in the frontal bones. No acute calvarial abnormality.  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 Atrophy, chronic microvascular disease. No acute intracranial abnormality. Electronically Signed   By: Charlett NoseKevin  Dover M.D.   On: 05/09/2019 20:24   Dg Chest Port 1 View  Result Date: 05/07/2019 CLINICAL DATA:  Seven 78-year-old female with NG tube placement. EXAM: PORTABLE CHEST 1 VIEW COMPARISON:  Chest radiograph dated 05/06/2019 FINDINGS: An enteric tube is noted with side port chest distal to the GE junction and tip in the left hemiabdomen likely in the proximal stomach. The tube can be further advanced by approximately 3-5 cm for optimal positioning. Bibasilar interstitial coarsening and nodularity, likely atelectasis or scarring. Pneumonia is not excluded. Clinical correlation is recommended. No focal consolidation, pleural effusion, or pneumothorax. Top-normal cardiac silhouette. Atherosclerotic calcification of the aortic arch. No acute osseous pathology. IMPRESSION: 1. Enteric tube with tip in the proximal stomach. 2. Bibasilar interstitial coarsening and nodularity, likely atelectasis or scarring. Pneumonia is not excluded. Electronically Signed   By: Elgie CollardArash  Radparvar M.D.   On: 05/07/2019 02:18   Dg Chest Port 1 View  Result Date: 05/06/2019 CLINICAL DATA:  NG tube placement EXAM: PORTABLE CHEST 1 VIEW COMPARISON:  Radiograph 05/05/2019 FINDINGS: The NG tube appears coiled within the proximal thoracic esophagus with the catheter kinked at the level of side port. Bibasilar opacities concerning for atelectasis or pneumonia are similar to comparison exam. No pneumothorax or visible effusion. The aorta is calcified. The remaining cardiomediastinal contours are unremarkable. No acute osseous or soft tissue abnormality. IMPRESSION: 1. The NG tube appears coiled within the proximal thoracic esophagus with the catheter kinked at the level of side port. 2. Bibasilar opacities concerning for atelectasis or  pneumonia, similar to comparison exam. These results will be called to the ordering clinician or representative by the Radiologist Assistant, and communication documented in the PACS or zVision Dashboard. Electronically Signed   By: Kreg ShropshirePrice  DeHay M.D.   On: 05/06/2019 19:36   Dg Chest Port 1 View  Result Date: 05/05/2019 CLINICAL DATA:  Pneumonia. EXAM: PORTABLE CHEST 1 VIEW COMPARISON:  May 02, 2019. FINDINGS: The heart size and mediastinal contours are within normal limits. No pneumothorax or pleural effusion is noted. Stable bibasilar opacities are noted concerning for pneumonia or atelectasis. The visualized skeletal structures are unremarkable. IMPRESSION: Stable bibasilar opacities as described above. Aortic Atherosclerosis (ICD10-I70.0). Electronically Signed   By: Lupita RaiderJames  Green Jr M.D.   On: 05/05/2019 08:38    Microbiology: Recent Results (from the past 240 hour(s))  SARS CORONAVIRUS 2 (TAT 6-24 HRS) Nasopharyngeal Nasopharyngeal Swab     Status: None   Collection Time: 05/30/19  1:50 PM   Specimen: Nasopharyngeal Swab  Result Value Ref Range Status   SARS Coronavirus 2 NEGATIVE NEGATIVE Final    Comment: (NOTE) SARS-CoV-2 target nucleic acids are NOT DETECTED. The SARS-CoV-2 RNA is generally detectable in upper and lower respiratory specimens during the acute phase of infection. Negative results do not preclude SARS-CoV-2 infection, do not rule out co-infections with other pathogens, and should not be used as the sole basis for treatment or other patient management decisions. Negative results must be combined with clinical observations, patient history, and epidemiological information. The expected result is Negative. Fact Sheet for Patients: HairSlick.nohttps://www.fda.gov/media/138098/download Fact Sheet for Healthcare Providers: quierodirigir.comhttps://www.fda.gov/media/138095/download This test is not yet approved or cleared by the [place] FDA and  has been authorized for detection and/or  diagnosis of SARS-CoV-2 by FDA under an Emergency Use Authorization (EUA). This EUA will remain  in effect (meaning this test can be used) for the duration  of the COVID-19 declaration under Section 56 4(b)(1) of the Act, 21 U.S.C. section 360bbb-3(b)(1), unless the authorization is terminated or revoked sooner. Performed at [facility], [address], [place], [place] [address]      Labs: Basic Metabolic Panel: No results for input(s): NA, K, CL, CO2, GLUCOSE, BUN, CREATININE, CALCIUM, MG, PHOS in the last 168 hours. Liver Function Tests: No results for input(s): AST, ALT, ALKPHOS, BILITOT, PROT, ALBUMIN in the last 168 hours. No results for input(s): LIPASE, AMYLASE in the last 168 hours. No results for input(s): AMMONIA in the last 168 hours. CBC: No results for input(s): WBC, NEUTROABS, HGB, HCT, MCV, PLT in the last 168 hours. Cardiac Enzymes: No results for input(s): CKTOTAL, CKMB, CKMBINDEX, TROPONINI in the last 168 hours. D-Dimer No results for input(s): DDIMER in the last 72 hours. BNP: Invalid input(s): POCBNP CBG: No results for input(s): GLUCAP in the last 168 hours. Anemia work up No results for input(s): VITAMINB12, FOLATE, FERRITIN, TIBC, IRON, RETICCTPCT in the last 72 hours. Urinalysis    Component Value Date/Time   COLORURINE AMBER (A) 05/02/2019 0826   APPEARANCEUR TURBID (A) 05/02/2019 0826   APPEARANCEUR Turbid 07/29/2014 1548   LABSPEC 1.020 05/02/2019 0826   LABSPEC 1.018 07/29/2014 1548   PHURINE 7.0 05/02/2019 0826   GLUCOSEU NEGATIVE 05/02/2019 0826   GLUCOSEU Negative 07/29/2014 1548   HGBUR LARGE (A) 05/02/2019 0826   BILIRUBINUR NEGATIVE 05/02/2019 0826   KETONESUR NEGATIVE 05/02/2019 0826   PROTEINUR >=300 (A) 05/02/2019 0826   NITRITE NEGATIVE 05/02/2019 0826   LEUKOCYTESUR MODERATE (A) 05/02/2019 0826   LEUKOCYTESUR 3+ 07/29/2014 1548   Sepsis Labs Invalid input(s): PROCALCITONIN,  WBC,  LACTICIDVEN     SIGNED:  Dimple Nanas, MD  [facility] 05/22/2019, 11:14 AM Pager   If 7PM-7AM, please contact night-coverage [email or web address] Password [email or web address]

## 2019-06-07 NOTE — Progress Notes (Signed)
Son Gerald Stabs called, notified that pt has expired, states that he is on his way

## 2019-06-07 DEATH — deceased
